# Patient Record
Sex: Female | Born: 1970 | Race: Black or African American | Hispanic: No | Smoking: Never smoker
Health system: Southern US, Community
[De-identification: ages and names within clinical notes are randomized; demographics above are authoritative.]

## PROBLEM LIST (undated history)

## (undated) DIAGNOSIS — G43909 Migraine, unspecified, not intractable, without status migrainosus: Secondary | ICD-10-CM

## (undated) DIAGNOSIS — I10 Essential (primary) hypertension: Secondary | ICD-10-CM

## (undated) DIAGNOSIS — F32A Depression, unspecified: Secondary | ICD-10-CM

## (undated) DIAGNOSIS — T1491XA Suicide attempt, initial encounter: Secondary | ICD-10-CM

## (undated) DIAGNOSIS — F329 Major depressive disorder, single episode, unspecified: Secondary | ICD-10-CM

## (undated) DIAGNOSIS — F70 Mild intellectual disabilities: Secondary | ICD-10-CM

## (undated) DIAGNOSIS — E785 Hyperlipidemia, unspecified: Secondary | ICD-10-CM

## (undated) HISTORY — PX: ABDOMINAL HYSTERECTOMY: SHX81

## (undated) HISTORY — DX: Migraine, unspecified, not intractable, without status migrainosus: G43.909

## (undated) HISTORY — PX: ENDOMETRIAL ABLATION: SHX621

## (undated) HISTORY — PX: TONSILLECTOMY: SUR1361

---

## 2010-10-16 ENCOUNTER — Inpatient Hospital Stay (INDEPENDENT_AMBULATORY_CARE_PROVIDER_SITE_OTHER)
Admission: RE | Admit: 2010-10-16 | Discharge: 2010-10-16 | Disposition: A | Discharge status: Home / Self Care | Payer: Medicaid Other | Source: Ambulatory Visit | Attending: Emergency Medicine | Admitting: Emergency Medicine

## 2010-10-16 DIAGNOSIS — J45909 Unspecified asthma, uncomplicated: Secondary | ICD-10-CM

## 2010-10-16 DIAGNOSIS — J40 Bronchitis, not specified as acute or chronic: Secondary | ICD-10-CM

## 2011-10-13 ENCOUNTER — Encounter (HOSPITAL_COMMUNITY): Payer: Self-pay

## 2011-10-13 ENCOUNTER — Emergency Department (HOSPITAL_COMMUNITY): Payer: Medicaid Other

## 2011-10-13 ENCOUNTER — Emergency Department (HOSPITAL_COMMUNITY)
Admission: EM | Admit: 2011-10-13 | Discharge: 2011-10-13 | Disposition: A | Discharge status: Home / Self Care | Payer: Medicaid Other | Attending: Emergency Medicine | Admitting: Emergency Medicine

## 2011-10-13 ENCOUNTER — Other Ambulatory Visit: Payer: Self-pay

## 2011-10-13 DIAGNOSIS — R51 Headache: Secondary | ICD-10-CM

## 2011-10-13 DIAGNOSIS — R079 Chest pain, unspecified: Secondary | ICD-10-CM | POA: Insufficient documentation

## 2011-10-13 DIAGNOSIS — I10 Essential (primary) hypertension: Secondary | ICD-10-CM | POA: Insufficient documentation

## 2011-10-13 DIAGNOSIS — Z79899 Other long term (current) drug therapy: Secondary | ICD-10-CM | POA: Insufficient documentation

## 2011-10-13 DIAGNOSIS — J45909 Unspecified asthma, uncomplicated: Secondary | ICD-10-CM | POA: Insufficient documentation

## 2011-10-13 HISTORY — DX: Depression, unspecified: F32.A

## 2011-10-13 HISTORY — DX: Essential (primary) hypertension: I10

## 2011-10-13 HISTORY — DX: Major depressive disorder, single episode, unspecified: F32.9

## 2011-10-13 LAB — DIFFERENTIAL
Basophils Absolute: 0 10*3/uL (ref 0.0–0.1)
Basophils Relative: 0 % (ref 0–1)
Eosinophils Absolute: 0.1 10*3/uL (ref 0.0–0.7)
Eosinophils Relative: 1 % (ref 0–5)
Lymphs Abs: 1.9 10*3/uL (ref 0.7–4.0)

## 2011-10-13 LAB — BASIC METABOLIC PANEL
Calcium: 9.1 mg/dL (ref 8.4–10.5)
GFR calc non Af Amer: 61 mL/min — ABNORMAL LOW (ref >90)
Glucose, Bld: 95 mg/dL (ref 70–99)
Sodium: 137 mEq/L (ref 135–145)

## 2011-10-13 LAB — CBC
MCH: 27.4 pg (ref 26.0–34.0)
MCHC: 33.3 g/dL (ref 30.0–36.0)
MCV: 82.1 fL (ref 78.0–100.0)
Platelets: 266 10*3/uL (ref 150–400)
RDW: 15.1 % (ref 11.5–15.5)

## 2011-10-13 LAB — POCT I-STAT TROPONIN I: Troponin i, poc: 0.02 ng/mL (ref 0.00–0.08)

## 2011-10-13 MED ORDER — METOCLOPRAMIDE HCL 5 MG/ML IJ SOLN
10.0000 mg | Freq: Once | INTRAMUSCULAR | Status: AC
  Administered 2011-10-13: 10 mg via INTRAVENOUS
  Filled 2011-10-13: qty 2

## 2011-10-13 MED ORDER — ONDANSETRON 8 MG PO TBDP: ORAL_TABLET | ORAL | Status: AC

## 2011-10-13 MED ORDER — HYDROCODONE-ACETAMINOPHEN 5-325 MG PO TABS: 2.0000 | ORAL_TABLET | ORAL | Status: AC | PRN

## 2011-10-13 MED ORDER — LORAZEPAM 2 MG/ML IJ SOLN
1.0000 mg | Freq: Once | INTRAMUSCULAR | Status: AC
  Administered 2011-10-13: 1 mg via INTRAVENOUS
  Filled 2011-10-13: qty 1

## 2011-10-13 NOTE — ED Notes (Signed)
Pt reports woke up yesterday morning with intermittent sharp chest pain in center of chest that radiates to left arm and headache.  Says bp has been elevated since yesterday.  Pt reports history of htn and has been taking her medication.

## 2011-10-13 NOTE — ED Provider Notes (Signed)
History   This chart was scribed for Hurman Horn, MD by Melba Coon. The patient was seen in room [facility] and the patient's care was started at 10:10AM.    CSN: [id number]  Arrival date & time 10/13/11  0948   None     Chief Complaint  Patient presents with  . Hypertension  . Chest Pain    (Consider location/radiation/quality/duration/timing/severity/associated sxs/prior treatment) HPI Kelli Walls is a 41 y.o. female who presents to the Emergency Department complaining of intermittent, sharp, stabbing, moderate to severe radiating sternal chest pain with an sudden onset yesterday. Timing of CP is a few seconds at a time and has a sudden onset. Pt has been under a lot of stress lately; no suicidal ideation. BP taken this morning and it was high (186/82). OTC pain meds have been taken for the CP. Pt also has a chronic intermittent cough. CP is not aggravated by the cough. HA gradually started today. Pt has been seeing white spots, but otherwise no change in vision, hearing, speech, coordination, swallowing, or understanding; no lateralizing or focal weakness, numbness, or incoordination. Pt has a Hx of HA and this episode is typical for the pt. No SOB, neck pain, back pain, abd pain or extremity pan, weakness, numbness, or edema. Pt has a hx of stress disorder and HTN and takes BP meds. No Hx of heart problems. Family Hx of diabetes (mother). No known allergies. No other pertinent medical problems.  Past Medical History  Diagnosis Date  . Hypertension   . Asthma   . Depression     History reviewed. No pertinent past surgical history.  No family history on file.  History  Substance Use Topics  . Smoking status: Never Smoker   . Smokeless tobacco: Not on file  . Alcohol Use: No    OB History    Grav Para Term Preterm Abortions TAB SAB Ect Mult Living                  Review of Systems 10 Systems reviewed and are negative for acute change except as noted in the  HPI.  Allergies  Review of patient's allergies indicates no known allergies.  Home Medications   Current Outpatient Rx  Name Route Sig Dispense Refill  . ACETAMINOPHEN 500 MG PO TABS Oral Take 1,000 mg by mouth every 6 (six) hours as needed. For pain    . BUSPIRONE HCL 10 MG PO TABS Oral Take 10 mg by mouth 3 (three) times daily.    Marland Kitchen FLUVASTATIN SODIUM 40 MG PO CAPS Oral Take 40 mg by mouth daily.    . ADULT MULTIVITAMIN W/MINERALS CH Oral Take 1 tablet by mouth daily.    Marland Kitchen RAMIPRIL 10 MG PO CAPS Oral Take 10 mg by mouth daily.    Marland Kitchen RISPERIDONE 0.25 MG PO TABS Oral Take 0.25 mg by mouth 2 (two) times daily.    Marland Kitchen HYDROCODONE-ACETAMINOPHEN 5-325 MG PO TABS Oral Take 2 tablets by mouth every 4 (four) hours as needed for pain. 4 tablet 0  . ONDANSETRON 8 MG PO TBDP  8mg  ODT q4 hours prn nausea 2 tablet 0    BP 130/80  Pulse 81  Resp 18  Ht 5\' 3"  (1.6 m)  Wt 221 lb (100.245 kg)  BMI 39.15 kg/m2  SpO2 98%  LMP 09/26/2011  Physical Exam  Nursing note and vitals reviewed. Constitutional: She is oriented to person, place, and time. She appears well-developed and well-nourished.  Awake, alert, nontoxic appearance with baseline speech for patient.  HENT:  Head: Normocephalic and atraumatic.  Mouth/Throat: Oropharynx is clear and moist. No oropharyngeal exudate.  Eyes: Conjunctivae and EOM are normal. Pupils are equal, round, and reactive to light. Right eye exhibits no discharge. Left eye exhibits no discharge.  Neck: Normal range of motion. Neck supple.  Cardiovascular: Normal rate, regular rhythm and normal heart sounds.  Exam reveals no gallop and no friction rub.   No murmur heard. Pulmonary/Chest: Effort normal and breath sounds normal. No stridor. No respiratory distress. She has no wheezes. She has no rales. She exhibits no tenderness.  Abdominal: Soft. Bowel sounds are normal. She exhibits no mass. There is no tenderness. There is no rebound.  Musculoskeletal: She exhibits  no tenderness.       Baseline ROM, moves extremities with no obvious new focal weakness.  Lymphadenopathy:    She has no cervical adenopathy.  Neurological: She is alert and oriented to person, place, and time. No cranial nerve deficit. Coordination normal.       Awake, alert, cooperative and aware of situation; motor strength bilaterally; sensation normal to light touch bilaterally; peripheral visual fields full to confrontation; no facial asymmetry; tongue midline; major cranial nerves appear intact; no pronator drift, normal finger to nose bilaterally, baseline gait without new ataxia.  Skin: Skin is warm and dry. No rash noted.  Psychiatric: She has a normal mood and affect. Her behavior is normal.    ED Course  Procedures (including critical care time)  ECG: Normal sinus rhythm, ventricular rate 91, normal axis, normal intervals, no acute ischemic changes noted, no comparison ECG available  DIAGNOSTIC STUDIES: Oxygen Saturation is 99% on room air, normal by my interpretation.    COORDINATION OF CARE:  1:18PM - recheck; BP nml; pt feeling much better; EDMD plans to d/c  Results for orders placed during the hospital encounter of 10/13/11  CBC      Component Value Range   WBC 8.8  4.0 - 10.5 (K/uL)   RBC 4.24  3.87 - 5.11 (MIL/uL)   Hemoglobin 11.6 (*) 12.0 - 15.0 (g/dL)   HCT 09.8 (*) 11.9 - 46.0 (%)   MCV 82.1  78.0 - 100.0 (fL)   MCH 27.4  26.0 - 34.0 (pg)   MCHC 33.3  30.0 - 36.0 (g/dL)   RDW 14.7  82.9 - 56.2 (%)   Platelets 266  150 - 400 (K/uL)  DIFFERENTIAL      Component Value Range   Neutrophils Relative 70  43 - 77 (%)   Neutro Abs 6.1  1.7 - 7.7 (K/uL)   Lymphocytes Relative 22  12 - 46 (%)   Lymphs Abs 1.9  0.7 - 4.0 (K/uL)   Monocytes Relative 7  3 - 12 (%)   Monocytes Absolute 0.6  0.1 - 1.0 (K/uL)   Eosinophils Relative 1  0 - 5 (%)   Eosinophils Absolute 0.1  0.0 - 0.7 (K/uL)   Basophils Relative 0  0 - 1 (%)   Basophils Absolute 0.0  0.0 - 0.1 (K/uL)   BASIC METABOLIC PANEL      Component Value Range   Sodium 137  135 - 145 (mEq/L)   Potassium 3.5  3.5 - 5.1 (mEq/L)   Chloride 105  96 - 112 (mEq/L)   CO2 24  19 - 32 (mEq/L)   Glucose, Bld 95  70 - 99 (mg/dL)   BUN 14  6 - 23 (mg/dL)   Creatinine,  Ser 1.13 (*) 0.50 - 1.10 (mg/dL)   Calcium 9.1  8.4 - 16.1 (mg/dL)   GFR calc non Af Amer 61 (*) >90 (mL/min)   GFR calc Af Amer 71 (*) >90 (mL/min)  POCT PREGNANCY, URINE      Component Value Range   Preg Test, Ur NEGATIVE  NEGATIVE   POCT I-STAT TROPONIN I      Component Value Range   Troponin i, poc 0.02  0.00 - 0.08 (ng/mL)   Comment 3             Dg Chest 2 View  10/13/2011  *RADIOLOGY REPORT*  Clinical Data: Hypertension.  Chest pain.  CHEST - 2 VIEW  Comparison: None.  Findings: The patient has taken a poor inspiration.  Allowing for that, I think the heart mediastinal shadows are normal and the lungs are clear.  No effusions.  No significant bony finding.  IMPRESSION: Poor inspiration.  No active disease suspected.  Original Report Authenticated By: Thomasenia Sales, M.D.     1. Headache   2. Chest pain   3. Hypertension       MDM  I personally performed the services described in this documentation, which was scribed in my presence. The recorded information has been reviewed and considered. I doubt any other EMC precluding discharge at this time including, but not necessarily limited to the following:ACS.     Hurman Horn, MD 10/13/11 2103

## 2011-10-13 NOTE — Discharge Instructions (Signed)
Your caregiver has diagnosed you as having chest pain that is not specific for one problem, but does not require admission.  You are at low risk for an acute heart condition or other serious illness. Chest pain comes from many different causes.  °SEEK IMMEDIATE MEDICAL ATTENTION IF: °You have severe chest pain, especially if the pain is crushing or pressure-like and spreads to the arms, back, neck, or jaw, or if you have sweating, nausea (feeling sick to your stomach), or shortness of breath. THIS IS AN EMERGENCY. Don't wait to see if the pain will go away. Get medical help at once. Call 911 or 0 (operator). DO NOT drive yourself to the hospital.  °Your chest pain gets worse and does not go away with rest.  °You have an attack of chest pain lasting longer than usual, despite rest and treatment with the medications your caregiver has prescribed.  °You wake from sleep with chest pain or shortness of breath.  °You feel dizzy or faint.  °You have chest pain not typical of your usual pain for which you originally saw your caregiver. ° °You are having a headache. No specific cause was found today for your headache. It may have been a migraine or other cause of headache. Stress, anxiety, fatigue, and depression are common triggers for headaches. Your headache today does not appear to be life-threatening or require hospitalization, but often the exact cause of headaches is not determined in the emergency department. Therefore, follow-up with your doctor is very important to find out what may have caused your headache, and whether or not you need any further diagnostic testing or treatment. Sometimes headaches can appear benign (not harmful), but then more serious symptoms can develop which should prompt an immediate re-evaluation by your doctor or the emergency department. °SEEK MEDICAL ATTENTION IF: °You develop possible problems with medications prescribed.  °The medications don't resolve your headache, if it recurs , or  if you have multiple episodes of vomiting or can't take fluids. °You have a change from the usual headache. °RETURN IMMEDIATELY IF you develop a sudden, severe headache or confusion, become poorly responsive or faint, develop a fever above 100.4F or problem breathing, have a change in speech, vision, swallowing, or understanding, or develop new weakness, numbness, tingling, incoordination, or have a seizure. ° °

## 2012-02-26 DIAGNOSIS — R030 Elevated blood-pressure reading, without diagnosis of hypertension: Secondary | ICD-10-CM

## 2012-12-19 ENCOUNTER — Encounter: Payer: Self-pay | Admitting: Family Medicine

## 2012-12-19 ENCOUNTER — Ambulatory Visit (INDEPENDENT_AMBULATORY_CARE_PROVIDER_SITE_OTHER): Payer: Medicaid Other | Admitting: Family Medicine

## 2012-12-19 VITALS — BP 152/94 | Temp 97.7°F | Ht 62.0 in | Wt 215.0 lb

## 2012-12-19 DIAGNOSIS — R0989 Other specified symptoms and signs involving the circulatory and respiratory systems: Secondary | ICD-10-CM

## 2012-12-19 DIAGNOSIS — R42 Dizziness and giddiness: Secondary | ICD-10-CM

## 2012-12-19 DIAGNOSIS — I10 Essential (primary) hypertension: Secondary | ICD-10-CM

## 2012-12-19 DIAGNOSIS — R0683 Snoring: Secondary | ICD-10-CM

## 2012-12-19 LAB — COMPREHENSIVE METABOLIC PANEL
ALT: 8 U/L (ref 0–35)
Alkaline Phosphatase: 42 U/L (ref 39–117)
Sodium: 138 mEq/L (ref 135–145)
Total Bilirubin: 0.5 mg/dL (ref 0.3–1.2)
Total Protein: 7.5 g/dL (ref 6.0–8.3)

## 2012-12-19 LAB — POCT URINALYSIS DIPSTICK
Glucose, UA: NEGATIVE
Nitrite, UA: NEGATIVE
Urobilinogen, UA: NEGATIVE
pH, UA: 5

## 2012-12-19 LAB — POCT UA - MICROALBUMIN

## 2012-12-19 LAB — POCT UA - MICROSCOPIC ONLY
Casts, Ur, LPF, POC: NEGATIVE
Crystals, Ur, HPF, POC: NEGATIVE
Yeast, UA: NEGATIVE

## 2012-12-19 LAB — LIPID PANEL
LDL Cholesterol: 174 mg/dL — ABNORMAL HIGH (ref 0–99)
Triglycerides: 171 mg/dL — ABNORMAL HIGH (ref <150)
VLDL: 34 mg/dL (ref 0–40)

## 2012-12-19 MED ORDER — AMLODIPINE BESY-BENAZEPRIL HCL 10-40 MG PO CAPS: 1.0000 | ORAL_CAPSULE | Freq: Every day | ORAL | Status: DC

## 2012-12-19 MED ORDER — HYDROCHLOROTHIAZIDE 25 MG PO TABS: 25.0000 mg | ORAL_TABLET | Freq: Every day | ORAL | Status: DC

## 2012-12-19 NOTE — Progress Notes (Signed)
  Subjective:    Patient ID: Kelli Walls, female    DOB: [birth year], 42 y.o.   MRN: [id number]  HPI Pt presents today with chief complaint of dizziness.  Pt represents mild positional dizziness over last 1-2 weeks.  Pt with baseline hx/o HTN.  Pt states that she has not taken medication over last 6 months.  Pt states that lotrel tends to make her sleepy.  Pt also reports severe snoring and daytime somnolence.  No CP, SOB.  No hemiparesis, confusion.  No active dizziness currently.  Pt also reports intermittent NSAID use.     Review of Systems  All other systems reviewed and are negative.       Objective:   Physical Exam  Constitutional: She appears well-developed.  HENT:  Head: Normocephalic and atraumatic.  Right Ear: External ear normal.  Left Ear: External ear normal.  Mouth/Throat: Oropharynx is clear and moist.  Eyes: Conjunctivae are normal. Pupils are equal, round, and reactive to light.  Neck: Normal range of motion. Neck supple.  Cardiovascular: Normal rate, regular rhythm and normal heart sounds.   Pulmonary/Chest: Effort normal and breath sounds normal.  Abdominal: Soft. Bowel sounds are normal.  Musculoskeletal: Normal range of motion.  Lymphadenopathy:    She has no cervical adenopathy.  Neurological: She is alert. She displays normal reflexes. No cranial nerve deficit. Coordination normal.  Skin: Skin is warm.          Assessment & Plan:  Dizziness:  Likely secondary to poorly controlled HTN. Pt has medication with her.  Will have take 1 tablet and reassess BP in hour.  Will check risk stratification labs including A1C, Lipid panel Check UA- if trace protein, continue ACEi  BP 160s/100 on repeat.  Will increase lotrel to 10/40 as well as start on HCTZ.  Currently asymptomatic.  Discussed CV red flags. Plan for recheck of BP in 3-4 hours with new medications in system.

## 2012-12-24 ENCOUNTER — Ambulatory Visit (INDEPENDENT_AMBULATORY_CARE_PROVIDER_SITE_OTHER): Payer: Medicaid Other | Admitting: Family Medicine

## 2012-12-24 ENCOUNTER — Encounter: Payer: Self-pay | Admitting: Family Medicine

## 2012-12-24 ENCOUNTER — Other Ambulatory Visit: Payer: Self-pay | Admitting: Family Medicine

## 2012-12-24 VITALS — BP 130/72 | HR 80 | Temp 99.1°F | Ht 63.0 in | Wt 212.0 lb

## 2012-12-24 DIAGNOSIS — I10 Essential (primary) hypertension: Secondary | ICD-10-CM

## 2012-12-24 DIAGNOSIS — E785 Hyperlipidemia, unspecified: Secondary | ICD-10-CM

## 2012-12-24 DIAGNOSIS — Z1231 Encounter for screening mammogram for malignant neoplasm of breast: Secondary | ICD-10-CM

## 2012-12-24 MED ORDER — ROSUVASTATIN CALCIUM 20 MG PO TABS: 20.0000 mg | ORAL_TABLET | Freq: Every day | ORAL | Status: DC

## 2012-12-24 NOTE — Progress Notes (Signed)
Patient ID: Kelli Walls, female   DOB: July 26, 1971, 42 y.o.   MRN: [id number] Subjective:    Patient here for follow-up of elevated blood pressure.  She is not exercising and is not currently adherent to a low-salt diet.  Blood pressure is not being checked at home. Cardiac symptoms: none. Patient denies: chest pain, chest pressure/discomfort and dyspnea. Cardiovascular risk factors: dyslipidemia, microalbuminuria and obesity (BMI >= 30 kg/m2). Use of agents associated with hypertension: none. History of target organ damage: none. Pt recently started on HCTZ and lotrel was increased to 10/40mg  tabs daily. Initially had some mild dizziness with HCTZ, however this has resolved. No CP,SOB.   The following portions of the patient's history were reviewed and updated as appropriate: allergies, current medications, past family history, past medical history, past social history, past surgical history and problem list.  Review of Systems  Pertinent items are noted in HPI.     Objective:    BP 130/72  Pulse 80  Temp(Src) 99.1 F (37.3 C) (Oral)  Ht 5\' 3"  (1.6 m)  Wt 212 lb (96.163 kg)  BMI 37.56 kg/m2  General Appearance:    Alert, cooperative, no distress, appears stated age  Head:    Normocephalic, without obvious abnormality, atraumatic  Eyes:    PERRL, conjunctiva/corneas clear, EOM's intact, fundi    benign, both eyes  Ears:    Normal TM's and external ear canals, both ears  Nose:   Nares normal, septum midline, mucosa normal, no drainage    or sinus tenderness  Throat:   Lips, mucosa, and tongue normal; teeth and gums normal  Neck:   Supple, symmetrical, trachea midline, no adenopathy;    thyroid:  no enlargement/tenderness/nodules; no carotid   bruit or JVD  Back:     Symmetric, no curvature, ROM normal, no CVA tenderness  Lungs:     Clear to auscultation bilaterally, respirations unlabored  Chest Wall:    No tenderness or deformity   Heart:    Regular rate and rhythm, S1 and S2 normal,  no murmur, rub   or gallop     Abdomen:     Soft, non-tender, bowel sounds active all four quadrants,    no masses, no organomegaly     Rectal:    Normal tone, normal prostate, no masses or tenderness;   guaiac negative stool  Extremities:   Extremities normal, atraumatic, no cyanosis or edema  Pulses:   2+ and symmetric all extremities  Skin:   Skin color, texture, turgor normal, no rashes or lesions  Lymph nodes:   Cervical, supraclavicular, and axillary nodes normal  Neurologic:   CNII-XII intact, normal strength, sensation and reflexes    throughout   EKG: NSR    Assessment:    Hypertension, normal blood pressure improved with meds. Evidence of target organ damage: none.    Plan:   Continue current treatment regimen.  Add on crestor for HLD. Recheck lipids in 6 months.  Continue ACE in setting of microalbuminuria.  Low salt diet and exercise.  Check BPs at home.  Baseline EKG obtained today.  Follow up in 6 weeks.      The patient and/or caregiver has been counseled thoroughly with regard to treatment plan and/or medications prescribed including dosage, schedule, interactions, rationale for use, and possible side effects and they verbalize understanding. Diagnoses and expected course of recovery discussed and will return if not improved as expected or if the condition worsens. Patient and/or caregiver verbalized understanding.

## 2012-12-24 NOTE — Patient Instructions (Signed)
Sodium-Controlled Diet Sodium is a mineral. It is found in many foods. Sodium may be found naturally or added during the making of a food. The most common form of sodium is salt, which is made up of sodium and chloride. Reducing your sodium intake involves changing your eating habits. The following guidelines will help you reduce the sodium in your diet:  Stop using the salt shaker.  Use salt sparingly in cooking and baking.  Substitute with sodium-free seasonings and spices.  Do not use a salt substitute (potassium chloride) without your caregiver's permission.  Include a variety of fresh, unprocessed foods in your diet.  Limit the use of processed and convenience foods that are high in sodium. USE THE FOLLOWING FOODS SPARINGLY: Breads/Starches  Commercial bread stuffing, commercial pancake or waffle mixes, coating mixes. Waffles. Croutons. Prepared (boxed or frozen) potato, rice, or noodle mixes that contain salt or sodium. Salted French fries or hash browns. Salted popcorn, breads, crackers, chips, or snack foods. Vegetables  Vegetables canned with salt or prepared in cream, butter, or cheese sauces. Sauerkraut. Tomato or vegetable juices canned with salt.  Fresh vegetables are allowed if rinsed thoroughly. Fruit  Fruit is okay to eat. Meat and Meat Substitutes  Salted or smoked meats, such as bacon or Canadian bacon, chipped or corned beef, hot dogs, salt pork, luncheon meats, pastrami, ham, or sausage. Canned or smoked fish, poultry, or meat. Processed cheese or cheese spreads, blue or Roquefort cheese. Battered or frozen fish products. Prepared spaghetti sauce. Baked beans. Reuben sandwiches. Salted nuts. Caviar. Milk  Limit buttermilk to 1 cup per week. Soups and Combination Foods  Bouillon cubes, canned or dried soups, broth, consomm. Convenience (frozen or packaged) dinners with more than 600 mg sodium. Pot pies, pizza, Asian food, fast food cheeseburgers, and specialty  sandwiches. Desserts and Sweets  Regular (salted) desserts, pie, commercial fruit snack pies, commercial snack cakes, canned puddings.  Eat desserts and sweets in moderation. Fats and Oils  Gravy mixes or canned gravy. No more than 1 to 2 tbs of salad dressing. Chip dips.  Eat fats and oils in moderation. Beverages  See those listed under the vegetables and milk groups. Condiments  Ketchup, mustard, meat sauces, salsa, regular (salted) and lite soy sauce or mustard. Dill pickles, olives, meat tenderizer. Prepared horseradish or pickle relish. Dutch-processed cocoa. Baking powder or baking soda used medicinally. Worcestershire sauce. "Light" salt. Salt substitute, unless approved by your caregiver. Document Released: 01/12/2002 Document Revised: 10/15/2011 Document Reviewed: 08/15/2009 [employer] Patient Information 2014 [employer].  

## 2013-01-11 ENCOUNTER — Ambulatory Visit: Payer: Medicaid Other | Attending: Family Medicine | Admitting: Sleep Medicine

## 2013-01-11 DIAGNOSIS — R0683 Snoring: Secondary | ICD-10-CM

## 2013-01-11 DIAGNOSIS — I10 Essential (primary) hypertension: Secondary | ICD-10-CM

## 2013-01-11 DIAGNOSIS — G4733 Obstructive sleep apnea (adult) (pediatric): Secondary | ICD-10-CM | POA: Insufficient documentation

## 2013-01-11 DIAGNOSIS — Z6839 Body mass index (BMI) 39.0-39.9, adult: Secondary | ICD-10-CM | POA: Insufficient documentation

## 2013-01-27 ENCOUNTER — Ambulatory Visit
Admission: RE | Admit: 2013-01-27 | Discharge: 2013-01-27 | Disposition: A | Discharge status: Home / Self Care | Payer: Medicaid Other | Source: Ambulatory Visit | Attending: Family Medicine | Admitting: Family Medicine

## 2013-01-27 DIAGNOSIS — Z1231 Encounter for screening mammogram for malignant neoplasm of breast: Secondary | ICD-10-CM

## 2013-01-27 NOTE — Procedures (Signed)
[facility] Avan Gullett A. Gerilyn Pilgrim, MD     [email or web address]        Kelli Walls, Kelli Walls                ACCOUNT NO.:  [id number]  MEDICAL RECORD NO.:  [id number]          PATIENT TYPE:  OUT  LOCATION:  SLEEP LAB                     FACILITY:  [facility]  PHYSICIAN:  Deaun Rocha A. Gerilyn Pilgrim, M.D. DATE OF BIRTH:  January 13, 1971  DATE OF STUDY:  01/11/2013                           NOCTURNAL POLYSOMNOGRAM  REFERRING PHYSICIAN:  Doree Albee, MD  INDICATION:  A 42 year old who presents with hypersomnia, fatigue, and snoring.  The study is being done to evaluate for obstructive sleep apnea syndrome.   EPWORTH SLEEPINESS SCORE:  11.  BMI:  39.  MEDICATIONS:  Lotrel, hydrochlorothiazide.  SLEEP ARCHITECTURE:  The total recording time is 398 minutes.  Sleep efficiency 94%.  Sleep latency 7.5 minutes.  REM latency 243 minutes. Stage N1 1%, N2 75%, N3 80%, and REM sleep 3%.  RESPIRATORY DATA:  Baseline oxygen saturation is 97, lowest saturation 87 during non-REM sleep.  Diagnostic AHI is 9 and RDI 10.  LIMB MOVEMENT SUMMARY:  PLM index 0.  ELECTROCARDIOGRAM SUMMARY:  Average heart rate is 78 with no significant dysrhythmias observed.  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  Thanks for this referral.    Luvenia Cranford A. Gerilyn Pilgrim, M.D.    KAD/MEDQ  D:  01/27/2013 08:50:07  T:  01/27/2013 09:04:45  Job:  [id number]

## 2013-02-02 ENCOUNTER — Ambulatory Visit (INDEPENDENT_AMBULATORY_CARE_PROVIDER_SITE_OTHER): Payer: Medicaid Other | Admitting: Family Medicine

## 2013-02-02 ENCOUNTER — Encounter: Payer: Self-pay | Admitting: Family Medicine

## 2013-02-02 VITALS — BP 111/69 | HR 102 | Temp 99.9°F | Wt 209.2 lb

## 2013-02-02 DIAGNOSIS — I10 Essential (primary) hypertension: Secondary | ICD-10-CM

## 2013-02-02 MED ORDER — AMLODIPINE BESY-BENAZEPRIL HCL 10-40 MG PO CAPS
1.0000 | ORAL_CAPSULE | Freq: Every day | ORAL | Status: DC
Start: 2013-02-02 — End: 2013-03-24

## 2013-02-02 NOTE — Progress Notes (Signed)
  Subjective:    Patient ID: Kelli Walls, female    DOB: 02-17-1971, 42 y.o.   MRN: [id number]  HPI This 42 y.o. female presents for evaluation of hypertension.  Patient was seen over a month for elevated blood pressure and was put on Amlodipine benazepril combo 10/40 one po qd along with HCTZ 25mg  po qd.  She reports having dizziness after prolonged standing that is relieved with sitting or lying down.  She has had a sleep study which shows mild OSAS but did not meet criteria for CPAP.   Review of Systems    No chest pain, SOB, HA, dizziness, vision change, N/V, diarrhea, constipation, dysuria, urinary urgency or frequency, myalgias, arthralgias or rash.  Objective:   Physical Exam Vital signs noted  Well developed well nourished female.  HEENT - Head atraumatic Normocephalic                Eyes - PERRLA, Conjuctiva - clear Sclera- Clear EOM                Throat - oropharanx wnl Respiratory - Lungs CTA bilateral Cardiac - RRR S1 and S2 without murmur BP standing - 90/68 Extremities - mild pre-tibial edema bilateral. Neuro - Grossly intact.       Assessment & Plan:  HTN (hypertension) She is experiencing some orthostasis so DC HCTZ.  Follow up in 3 months.  Discussed DASH diet, exercise and weight loss.  She has had a negative mammogram and up to date pap.  She is advised to follow up if she experiences any continued orthostasis.  Advised to get up slowly for next week. Follow up in 3 months.

## 2013-02-02 NOTE — Patient Instructions (Signed)

## 2013-02-13 ENCOUNTER — Telehealth: Payer: Self-pay | Admitting: *Deleted

## 2013-02-13 NOTE — Telephone Encounter (Signed)
Medicaid denied coverage of crestor  because she must try at least 2 of the preferred statins and fail and she has only tried one that being Lipitor.  The other options are lovastatin, pravastatin or simvastatin. Currently she did not get the crestor filled due to cost, can you take care of prescribing one of these others and either of them should go through. Thanks!

## 2013-02-14 NOTE — Telephone Encounter (Signed)
Please Rx pravachol 40mg  daily.  #30, RF-6 Recheck LDL in 3-6 months.

## 2013-02-18 ENCOUNTER — Other Ambulatory Visit: Payer: Self-pay | Admitting: *Deleted

## 2013-02-18 MED ORDER — PRAVASTATIN SODIUM 40 MG PO TABS: 40.0000 mg | ORAL_TABLET | Freq: Every day | ORAL | Status: DC

## 2013-02-18 NOTE — Telephone Encounter (Signed)
Talked with Kelvin's mother in Eric's absence and she said she was sure Carnesha would want to try the pravachol and see how it works instead of paying for crestor. Also advised her to let us know if she has any side effects from new medication.  Rx called in to pharmacy.

## 2013-03-24 ENCOUNTER — Emergency Department (HOSPITAL_COMMUNITY): Payer: Medicaid Other

## 2013-03-24 ENCOUNTER — Emergency Department (HOSPITAL_COMMUNITY)
Admission: EM | Admit: 2013-03-24 | Discharge: 2013-03-24 | Disposition: A | Discharge status: Home / Self Care | Payer: Medicaid Other | Attending: Emergency Medicine | Admitting: Emergency Medicine

## 2013-03-24 ENCOUNTER — Encounter (HOSPITAL_COMMUNITY): Payer: Self-pay | Admitting: *Deleted

## 2013-03-24 DIAGNOSIS — Z8659 Personal history of other mental and behavioral disorders: Secondary | ICD-10-CM | POA: Insufficient documentation

## 2013-03-24 DIAGNOSIS — R002 Palpitations: Secondary | ICD-10-CM | POA: Insufficient documentation

## 2013-03-24 DIAGNOSIS — I10 Essential (primary) hypertension: Secondary | ICD-10-CM | POA: Insufficient documentation

## 2013-03-24 DIAGNOSIS — J45909 Unspecified asthma, uncomplicated: Secondary | ICD-10-CM | POA: Insufficient documentation

## 2013-03-24 DIAGNOSIS — F29 Unspecified psychosis not due to a substance or known physiological condition: Secondary | ICD-10-CM | POA: Insufficient documentation

## 2013-03-24 DIAGNOSIS — R5381 Other malaise: Secondary | ICD-10-CM | POA: Insufficient documentation

## 2013-03-24 DIAGNOSIS — IMO0002 Reserved for concepts with insufficient information to code with codable children: Secondary | ICD-10-CM | POA: Insufficient documentation

## 2013-03-24 DIAGNOSIS — Z880 Allergy status to penicillin: Secondary | ICD-10-CM | POA: Insufficient documentation

## 2013-03-24 DIAGNOSIS — R0602 Shortness of breath: Secondary | ICD-10-CM | POA: Insufficient documentation

## 2013-03-24 DIAGNOSIS — F411 Generalized anxiety disorder: Secondary | ICD-10-CM | POA: Insufficient documentation

## 2013-03-24 DIAGNOSIS — R51 Headache: Secondary | ICD-10-CM | POA: Insufficient documentation

## 2013-03-24 DIAGNOSIS — Z79899 Other long term (current) drug therapy: Secondary | ICD-10-CM | POA: Insufficient documentation

## 2013-03-24 LAB — CBC WITH DIFFERENTIAL/PLATELET
Basophils Absolute: 0 10*3/uL (ref 0.0–0.1)
Basophils Relative: 0 % (ref 0–1)
Eosinophils Absolute: 0 10*3/uL (ref 0.0–0.7)
Hemoglobin: 12 g/dL (ref 12.0–15.0)
MCHC: 33.3 g/dL (ref 30.0–36.0)
Monocytes Relative: 7 % (ref 3–12)
Neutro Abs: 5.7 10*3/uL (ref 1.7–7.7)
Neutrophils Relative %: 65 % (ref 43–77)
Platelets: 281 10*3/uL (ref 150–400)
RDW: 14.6 % (ref 11.5–15.5)

## 2013-03-24 LAB — COMPREHENSIVE METABOLIC PANEL
ALT: 8 U/L (ref 0–35)
AST: 11 U/L (ref 0–37)
Albumin: 3.6 g/dL (ref 3.5–5.2)
Alkaline Phosphatase: 43 U/L (ref 39–117)
Chloride: 105 mEq/L (ref 96–112)
Potassium: 3.4 mEq/L — ABNORMAL LOW (ref 3.5–5.1)
Sodium: 139 mEq/L (ref 135–145)
Total Bilirubin: 0.2 mg/dL — ABNORMAL LOW (ref 0.3–1.2)
Total Protein: 7.2 g/dL (ref 6.0–8.3)

## 2013-03-24 MED ORDER — AMLODIPINE BESY-BENAZEPRIL HCL 10-40 MG PO CAPS: 1.0000 | ORAL_CAPSULE | Freq: Every day | ORAL | Status: DC

## 2013-03-24 NOTE — ED Notes (Addendum)
"   I was under a lot of stress and my  bp went up.  150/93" feels sob, and "My mouth is numb"  Rt side of face numb, ? Of time. With rt facial droop

## 2013-03-24 NOTE — ED Provider Notes (Signed)
CSN: [id number]     Arrival date & time 03/24/13  1402 History    This chart was scribed for [employer]. Rubin Payor, MD,  by Ashley Jacobs, ED Scribe. The patient was seen in room APA19/APA19 and the patient's care was started at 1:23 PM.    First MD Initiated Contact with Patient 03/24/13 1418     Chief Complaint  Patient presents with  . Numbness   (Consider location/radiation/quality/duration/timing/severity/associated sxs/prior Treatment) The history is provided by the patient and medical records. No language interpreter was used.   HPI Comments: Kelli Walls is a 42 y.o. female who presents to the Emergency Department complaining of hypertension after having a argument with her boyfriend the 6 hours prior to arrival. Pt mention that she experiences numbness around the right side of her mouth and around her forehead. Pt mentions that she experiences headaches with bright lights. She mentions that she feels like her "heart is about to come out of her throat" and experiences SOB. She reports during the event when she was really aggitated she experienced confusion and her blood pressure was elevated.   Pt has a hx of hypertension, asthma, and depression.   Past Medical History  Diagnosis Date  . Hypertension   . Asthma   . Depression    Past Surgical History  Procedure Laterality Date  . Tonsillectomy    . Endometrial ablation     Family History  Problem Relation Age of Onset  . Diabetes Mother   . Hyperlipidemia Mother    History  Substance Use Topics  . Smoking status: Never Smoker   . Smokeless tobacco: Never Used  . Alcohol Use: No   OB History   Grav Para Term Preterm Abortions TAB SAB Ect Mult Living                 Review of Systems  Respiratory: Positive for shortness of breath.   Cardiovascular: Positive for palpitations.  Neurological: Positive for weakness, numbness and headaches.  Psychiatric/Behavioral: Positive for confusion and agitation. The patient is  nervous/anxious.   All other systems reviewed and are negative.    Allergies  Penicillins  Home Medications   Current Outpatient Rx  Name  Route  Sig  Dispense  Refill  . pravastatin (PRAVACHOL) 40 MG tablet   Oral   Take 1 tablet (40 mg total) by mouth daily.   30 tablet   6   . amLODipine-benazepril (LOTREL) 10-40 MG per capsule   Oral   Take 1 capsule by mouth daily.   30 capsule   0    BP 160/113  Pulse 85  Temp(Src) 98.5 F (36.9 C) (Oral)  Resp 14  Ht 5\' 3"  (1.6 m)  Wt 212 lb 6 oz (96.333 kg)  BMI 37.63 kg/m2  SpO2 100%  LMP 03/14/2013 Physical Exam  Nursing note and vitals reviewed. Constitutional: She is oriented to person, place, and time. She appears well-developed and well-nourished. No distress.  HENT:  Head: Normocephalic and atraumatic.  Mouth/Throat: Oropharynx is clear and moist.  Eyes: Conjunctivae are normal. Pupils are equal, round, and reactive to light. No scleral icterus.  Left eye will not go laterally  Neck: Neck supple.  Cardiovascular: Normal rate, regular rhythm, normal heart sounds and intact distal pulses.   No murmur heard. Pulmonary/Chest: Effort normal and breath sounds normal. No stridor. No respiratory distress. She has no rales.  Abdominal: Soft. Bowel sounds are normal. She exhibits no distension. There is no tenderness.  Musculoskeletal:  Normal range of motion.  Neurological: She is alert and oriented to person, place, and time.  Right sided facial numbness  Skin: Skin is warm and dry. No rash noted.  Psychiatric: She has a normal mood and affect. Her behavior is normal.    ED Course  DIAGNOSTIC STUDIES: Oxygen Saturation is 100% on room air, normal by my interpretation.    COORDINATION OF CARE: 2:26 PM Discussed course of care with pt . Pt understands and agrees.  Procedures (including critical care time)  Labs Reviewed  COMPREHENSIVE METABOLIC PANEL - Abnormal; Notable for the following:    Potassium 3.4 (*)     Glucose, Bld 116 (*)    Total Bilirubin 0.2 (*)    GFR calc non Af Amer 66 (*)    GFR calc Af Amer 76 (*)    All other components within normal limits  CBC WITH DIFFERENTIAL   Ct Head Wo Contrast  03/24/2013   *RADIOLOGY REPORT*  Clinical Data: Right facial numbness  CT HEAD WITHOUT CONTRAST  Technique:  Contiguous axial images were obtained from the base of the skull through the vertex without contrast.  Comparison: CT head 01/28/2009  Findings: Ventricle size is normal.  Negative for acute or chronic infarction.  Negative for hemorrhage or mass.  The calvarium is intact.  IMPRESSION: Negative   Original Report Authenticated By: Janeece Riggers, M.D.   Mr Brain Wo Contrast  03/24/2013   *RADIOLOGY REPORT*  Clinical Data: Right facial numbness.  Unable collaterally with the left eye.  MRI HEAD WITHOUT CONTRAST  Technique:  Multiplanar, multiecho pulse sequences of the brain and surrounding structures were obtained according to standard protocol without intravenous contrast.  Comparison: CT head without contrast 03/24/2013.  Findings: The diffusion weighted images demonstrate no evidence for acute or subacute infarction.  Two or three scattered subcortical T2 hyperintensities are present.  No hemorrhage or mass lesion is present.  Flow is present in the major intracranial arteries.  The globes and orbits are intact.  The paranasal sinuses and mastoid air cells are clear.  IMPRESSION:  1.  Scattered subcortical T2 hyperintensities are likely within normal limits for age.  The largest lesion is in the anterior left frontal lobe, measuring 8 mm.  A demyelinating process is not excluded. 2.  No acute intracranial abnormality.   Original Report Authenticated By: Marin Roberts, M.D.   1. Headache   2. Hypertension     MDM  Patient presents with headache and some neural findings after anxiety. Patient stated that the inability of her left eye to go laterally was new. After discussion with the patient's  family later he states she's been born that way. No facial droop. MRI was done and showed likely normal findings but a demyelinating process is not excluded. Will follow with neurology. Patient's been off her blood pressure medicines will be discharged home with a new prescription. I personally performed the services described in this documentation, which was scribed in my presence. The recorded information has been reviewed and is accurate.     Juliet Rude. Rubin Payor, MD 03/24/13 1740

## 2013-03-25 ENCOUNTER — Telehealth: Payer: Self-pay | Admitting: Family Medicine

## 2013-03-25 NOTE — Telephone Encounter (Signed)
Pt notifed and she has her BP meds  No further questions

## 2013-03-27 ENCOUNTER — Encounter: Payer: Self-pay | Admitting: Family Medicine

## 2013-03-27 ENCOUNTER — Ambulatory Visit (INDEPENDENT_AMBULATORY_CARE_PROVIDER_SITE_OTHER): Payer: Medicaid Other | Admitting: Family Medicine

## 2013-03-27 VITALS — BP 106/71 | HR 84 | Temp 98.1°F | Ht 63.0 in | Wt 211.8 lb

## 2013-03-27 DIAGNOSIS — R51 Headache: Secondary | ICD-10-CM

## 2013-03-27 NOTE — Progress Notes (Signed)
  Subjective:    Patient ID: Kelli Walls, female    DOB: 10-Nov-1970, 42 y.o.   MRN: [id number]  HPI This 42 y.o. female presents for evaluation of recent visit to ED.  She quit taking her bp medicine and she developed Headache, numbness in her tounge and face, and she underwent a CT and MRI of the brain.  Results  Show several T2 subcortical hyperdensities left frontal lobe and one measuring 8mm which demylinating disease is not excluded. She is feeling better and is taking her bp medicine. She has hx of migraine headaches.  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 .   Review of Systems  C/o headaches No chest pain, SOB, dizziness, vision change, N/V, diarrhea, constipation, dysuria, urinary urgency or frequency, myalgias, arthralgias or rash.     Objective:   Physical Exam  Vital signs noted  Well developed well nourished female.  HEENT - Head atraumatic Normocephalic                Eyes - PERRLA, Conjuctiva - clear Sclera- Clear Unable to lateralize or move medial OS                Ears - EAC's Wnl TM's Wnl Gross Hearing WNL                Nose - Nares patent                 Throat - oropharanx wnl Respiratory - Lungs CTA bilateral Cardiac - RRR S1 and S2 without murmur GI - Abdomen soft Nontender and bowel sounds active x 4 Extremities - No edema. Neuro - Grossly intact.      Assessment & Plan:  Chronic headaches - Plan: Ambulatory referral to Neurology.  HTN - Controlled and encouraged her to be more compliant.

## 2013-03-27 NOTE — Patient Instructions (Signed)
Headache and Allergies The relationship between allergies and headaches is unclear. Many people with allergic or infectious nasal problems also have headaches (migraines or sinus headaches). However, sometimes allergies can cause pressure that feels like a headache, and sometimes headaches can cause allergy-like symptoms. It is not always clear whether your symptoms are caused by allergies or by a headache. CAUSES   Migraine: The cause of a migraine is not always known.  Sinus Headache: The cause of a sinus headache may be a sinus infection. Other conditions that may be related to sinus headaches include:  Hay fever (allergic rhinitis).  Deviation of the nasal septum.  Swelling or clogging of the nasal passages. SYMPTOMS  Migraine headache symptoms (which often last 4 to 72 hours) include:  Intense, throbbing pain on one or both sides of the head.  Nausea.  Vomiting.  Being extra sensitive to light.  Being extra sensitive to sound.  Nervous system reactions that appear similar to an allergic reaction:  Stuffy nose.  Runny nose.  Tearing. Sinus headaches are felt as facial pain or pressure.  DIAGNOSIS  Because there is some overlap in symptoms, sinus and migraine headaches are often misdiagnosed. For example, a person with migraines may also feel facial pressure. Likewise, many people with hay fever may get migraine headaches rather than sinus headaches. These migraines can be triggered by the histamine release during an allergic reaction. An antihistamine medicine can eliminate this pain. There are standard criteria that help clarify the difference between these headaches and related allergy or allergy-like symptoms. Your caregiver can use these criteria to determine the proper diagnosis and provide you the best care. TREATMENT  Migraine medicine may help people who have persistent migraine headaches even though their hay fever is controlled. For some people, anti-inflammatory  treatments do not work to relieve migraines. Medicines called triptans (such as sumatriptan) can be helpful for those people. Document Released: 10/13/2003 Document Revised: 10/15/2011 Document Reviewed: 11/04/2009 ExitCare Patient Information 2014 [employer].  

## 2013-04-20 ENCOUNTER — Ambulatory Visit (INDEPENDENT_AMBULATORY_CARE_PROVIDER_SITE_OTHER): Payer: Medicaid Other | Admitting: Neurology

## 2013-04-20 ENCOUNTER — Encounter: Payer: Self-pay | Admitting: Neurology

## 2013-04-20 VITALS — BP 132/79 | HR 86 | Ht 61.5 in | Wt 209.0 lb

## 2013-04-20 DIAGNOSIS — I10 Essential (primary) hypertension: Secondary | ICD-10-CM

## 2013-04-20 DIAGNOSIS — G43909 Migraine, unspecified, not intractable, without status migrainosus: Secondary | ICD-10-CM

## 2013-04-20 MED ORDER — SUMATRIPTAN SUCCINATE 50 MG PO TABS: 50.0000 mg | ORAL_TABLET | ORAL | Status: DC | PRN

## 2013-04-20 NOTE — Progress Notes (Signed)
[facility]  PATIENT: Kelli Walls DOB: 17-Jun-1971  HISTORICAL Kelli Walls is a 42 yo RH  African American female, accompanied by her mother, referred by her primary care physician Dr. Rudi Heap for evaluation of mild abnormal MRI scan  She was born only 4 pounds, developmentally delayed, she was able to walk at 47 and half years old, was diagnosed with mild mental retardation, she graduated from special grade school at 12th grade, lives with her mother now, she has frequent raging outburst when she was younger, which is even worse now.  In March 24 2013, she had argument with her boyfriend, began to experience hypertension, numbness at her mouth, at her forehead, with associated severe headache, this leading to MRI of the brain, scattered subcortical T2 hyperintensities are likely within normal limits for age. The largest lesion is in the anterior left frontal lobe, measuring 8 mm.   She has a history of headaches since teenager, her headache are retrorbital area severe pounding headache with associated light noise sensitivity, pressure, she sees flashlight in her visual field, lasting for 15 minutes, headaches can last up to 6 hours, trigger for her headaches are stress, exertion, menstruation,  She took tylenol, ibuprofen, Excedrin Migraine are helpful too.    REVIEW OF SYSTEMS: Full 14 system review of systems performed and notable only for angry, outburst  ALLERGIES: Allergies  Allergen Reactions  . Penicillins Itching    HOME MEDICATIONS: Outpatient Prescriptions Prior to Visit  Medication Sig Dispense Refill  . amLODipine-benazepril (LOTREL) 10-40 MG per capsule Take 1 capsule by mouth daily.  30 capsule  0  . pravastatin (PRAVACHOL) 40 MG tablet Take 1 tablet (40 mg total) by mouth daily.  30 tablet  6   No facility-administered medications prior to visit.    PAST MEDICAL HISTORY: Past Medical History  Diagnosis Date  . Hypertension   . Asthma   .  Depression     PAST SURGICAL HISTORY: Past Surgical History  Procedure Laterality Date  . Tonsillectomy    . Endometrial ablation      FAMILY HISTORY: Family History  Problem Relation Age of Onset  . Diabetes Mother   . Hyperlipidemia Mother     SOCIAL HISTORY:  History   Social History  . Marital Status: Legally Separated    Spouse Name: N/A    Number of Children: N/A  . Years of Education: N/A    Social History Main Topics  . Smoking status: Never Smoker   . Smokeless tobacco: Never Used  . Alcohol Use: No  . Drug Use: No  . Sexual Activity: Not on file    Social History Narrative   Patient lives at home with her mother Deatra Canter.    Patient is single.    Patient has no children.    Patient has 12th grade education.    PHYSICAL EXAM    Filed Vitals:   04/20/13 0953  BP: 132/79  Pulse: 86  Height: 5' 1.5" (1.562 m)  Weight: 209 lb (94.802 kg)   Body mass index is 38.86 kg/(m^2).   Generalized: In no acute distress  Neck: Supple, no carotid bruits   Cardiac: Regular rate rhythm  Pulmonary: Clear to auscultation bilaterally  Musculoskeletal: No deformity  Neurological examination  Mentation: Alert oriented to time, place, history taking, and causual conversation  Cranial nerve II-XII: Pupils were equal round reactive to light. visual field were full on confrontational test. She has difficulty with left eye horizontal movement, could not  do left eye abduction or adduction.  Facial sensation and strength were normal. hearing was intact to finger rubbing bilaterally. Uvula tongue midline.  head turning and shoulder shrug and were normal and symmetric.Tongue protrusion into cheek strength was normal.  Motor: normal tone, bulk and strength.  Sensory: Intact to fine touch, pinprick, preserved vibratory sensation, and proprioception at toes.  Coordination: Normal finger to nose, heel-to-shin bilaterally there was no truncal ataxia  Gait: Rising up from  seated position without assistance, normal stance, without trunk ataxia, moderate stride, good arm swing, smooth turning, able to perform tiptoe, and heel walking without difficulty.   Romberg signs: Negative  Deep tendon reflexes: Brachioradialis 2/2, biceps 2/2, triceps 2/2, patellar 2/2, Achilles 2/2, plantar responses were flexor bilaterally.   DIAGNOSTIC DATA (LABS, IMAGING, TESTING) - I reviewed patient records, labs, notes, testing and imaging myself where available.  Lab Results  Component Value Date   WBC 8.8 03/24/2013   HGB 12.0 03/24/2013   HCT 36.0 03/24/2013   MCV 85.7 03/24/2013   PLT 281 03/24/2013      Component Value Date/Time   NA 139 03/24/2013 1449   K 3.4* 03/24/2013 1449   CL 105 03/24/2013 1449   CO2 28 03/24/2013 1449   GLUCOSE 116* 03/24/2013 1449   BUN 11 03/24/2013 1449   CREATININE 1.05 03/24/2013 1449   CREATININE 1.12* 12/19/2012 1017   CALCIUM 9.3 03/24/2013 1449   PROT 7.2 03/24/2013 1449   ALBUMIN 3.6 03/24/2013 1449   AST 11 03/24/2013 1449   ALT 8 03/24/2013 1449   ALKPHOS 43 03/24/2013 1449   BILITOT 0.2* 03/24/2013 1449   GFRNONAA 66* 03/24/2013 1449   GFRAA 76* 03/24/2013 1449   Lab Results  Component Value Date   CHOL 254* 12/19/2012   HDL 46 12/19/2012   LDLCALC 174* 12/19/2012   TRIG 171* 12/19/2012   CHOLHDL 5.5 12/19/2012   Lab Results  Component Value Date   HGBA1C 5.3% 12/19/2012     ASSESSMENT AND PLAN   42 years old right-handed Caucasian female, with history of angry outbursts, mild mental retardation, migraine headaches, now presenting with mild abnormal MRI scan, one 8 mm left side frontal area T2 lesions, which most likely migraine related  1. continue SSRI, 2. Imitrex 50 mg as needed for migraine headaches 3. Will only repeat MRI if she has new neurological symptoms, she only has one oval-shaped white matter lesions, less suggestive of MS at this point,    Levert Feinstein, M.D. Ph.D.  [facility] [address],  [address] [place], [place] [address] [phone]

## 2013-05-21 ENCOUNTER — Encounter (HOSPITAL_COMMUNITY): Payer: Self-pay | Admitting: Emergency Medicine

## 2013-05-21 ENCOUNTER — Inpatient Hospital Stay (HOSPITAL_COMMUNITY)
Admission: EM | Admit: 2013-05-21 | Discharge: 2013-05-23 | DRG: 918 | Disposition: A | Discharge status: Other Acute Inpatient Hospital | Payer: Medicaid Other | Attending: Internal Medicine | Admitting: Internal Medicine

## 2013-05-21 DIAGNOSIS — T438X2D Poisoning by other psychotropic drugs, intentional self-harm, subsequent encounter: Secondary | ICD-10-CM

## 2013-05-21 DIAGNOSIS — F411 Generalized anxiety disorder: Secondary | ICD-10-CM | POA: Diagnosis present

## 2013-05-21 DIAGNOSIS — E876 Hypokalemia: Secondary | ICD-10-CM | POA: Diagnosis present

## 2013-05-21 DIAGNOSIS — F70 Mild intellectual disabilities: Secondary | ICD-10-CM | POA: Diagnosis present

## 2013-05-21 DIAGNOSIS — F329 Major depressive disorder, single episode, unspecified: Secondary | ICD-10-CM | POA: Diagnosis present

## 2013-05-21 DIAGNOSIS — F32A Depression, unspecified: Secondary | ICD-10-CM | POA: Diagnosis present

## 2013-05-21 DIAGNOSIS — I10 Essential (primary) hypertension: Secondary | ICD-10-CM | POA: Diagnosis present

## 2013-05-21 DIAGNOSIS — T438X2A Poisoning by other psychotropic drugs, intentional self-harm, initial encounter: Secondary | ICD-10-CM

## 2013-05-21 DIAGNOSIS — J45909 Unspecified asthma, uncomplicated: Secondary | ICD-10-CM | POA: Diagnosis present

## 2013-05-21 DIAGNOSIS — T43224A Poisoning by selective serotonin reuptake inhibitors, undetermined, initial encounter: Principal | ICD-10-CM | POA: Diagnosis present

## 2013-05-21 DIAGNOSIS — Z833 Family history of diabetes mellitus: Secondary | ICD-10-CM

## 2013-05-21 DIAGNOSIS — Z6836 Body mass index (BMI) 36.0-36.9, adult: Secondary | ICD-10-CM

## 2013-05-21 DIAGNOSIS — T438X4A Poisoning by other psychotropic drugs, undetermined, initial encounter: Secondary | ICD-10-CM

## 2013-05-21 DIAGNOSIS — R9431 Abnormal electrocardiogram [ECG] [EKG]: Secondary | ICD-10-CM | POA: Diagnosis not present

## 2013-05-21 DIAGNOSIS — F332 Major depressive disorder, recurrent severe without psychotic features: Secondary | ICD-10-CM | POA: Diagnosis present

## 2013-05-21 DIAGNOSIS — Z23 Encounter for immunization: Secondary | ICD-10-CM

## 2013-05-21 DIAGNOSIS — T43502A Poisoning by unspecified antipsychotics and neuroleptics, intentional self-harm, initial encounter: Secondary | ICD-10-CM | POA: Diagnosis present

## 2013-05-21 DIAGNOSIS — N289 Disorder of kidney and ureter, unspecified: Secondary | ICD-10-CM | POA: Diagnosis present

## 2013-05-21 DIAGNOSIS — E785 Hyperlipidemia, unspecified: Secondary | ICD-10-CM | POA: Diagnosis present

## 2013-05-21 HISTORY — DX: Hyperlipidemia, unspecified: E78.5

## 2013-05-21 HISTORY — DX: Suicide attempt, initial encounter: T14.91XA

## 2013-05-21 HISTORY — DX: Mild intellectual disabilities: F70

## 2013-05-21 LAB — ACETAMINOPHEN LEVEL: Acetaminophen (Tylenol), Serum: <15 ug/mL (ref 10–30)

## 2013-05-21 LAB — MAGNESIUM: Magnesium: 1.9 mg/dL (ref 1.5–2.5)

## 2013-05-21 LAB — COMPREHENSIVE METABOLIC PANEL
ALT: 10 U/L (ref 0–35)
Alkaline Phosphatase: 39 U/L (ref 39–117)
BUN: 15 mg/dL (ref 6–23)
CO2: 24 mEq/L (ref 19–32)
Calcium: 9.6 mg/dL (ref 8.4–10.5)
GFR calc Af Amer: 65 mL/min — ABNORMAL LOW (ref >90)
GFR calc non Af Amer: 56 mL/min — ABNORMAL LOW (ref >90)
Glucose, Bld: 95 mg/dL (ref 70–99)
Total Protein: 7.5 g/dL (ref 6.0–8.3)

## 2013-05-21 LAB — CBC WITH DIFFERENTIAL/PLATELET
Basophils Absolute: 0 10*3/uL (ref 0.0–0.1)
Basophils Relative: 0 % (ref 0–1)
HCT: 39.5 % (ref 36.0–46.0)
Hemoglobin: 13.6 g/dL (ref 12.0–15.0)
Lymphocytes Relative: 20 % (ref 12–46)
MCHC: 34.4 g/dL (ref 30.0–36.0)
Monocytes Absolute: 0.6 10*3/uL (ref 0.1–1.0)
Neutro Abs: 7.2 10*3/uL (ref 1.7–7.7)
Neutrophils Relative %: 73 % (ref 43–77)
RDW: 13.3 % (ref 11.5–15.5)
WBC: 9.8 10*3/uL (ref 4.0–10.5)

## 2013-05-21 LAB — SALICYLATE LEVEL: Salicylate Lvl: <2 mg/dL — ABNORMAL LOW (ref 2.8–20.0)

## 2013-05-21 LAB — RAPID URINE DRUG SCREEN, HOSP PERFORMED: Opiates: NOT DETECTED

## 2013-05-21 MED ORDER — ADULT MULTIVITAMIN W/MINERALS CH
1.0000 | ORAL_TABLET | Freq: Every day | ORAL | Status: DC
  Administered 2013-05-22 – 2013-05-23 (×2): 1 via ORAL
  Filled 2013-05-21 (×2): qty 1

## 2013-05-21 MED ORDER — ONDANSETRON HCL 4 MG/2ML IJ SOLN: 4.0000 mg | Freq: Four times a day (QID) | INTRAMUSCULAR | Status: DC | PRN

## 2013-05-21 MED ORDER — ONDANSETRON HCL 4 MG PO TABS: 4.0000 mg | ORAL_TABLET | Freq: Four times a day (QID) | ORAL | Status: DC | PRN

## 2013-05-21 MED ORDER — SODIUM CHLORIDE 0.9 % IV BOLUS (SEPSIS)
1000.0000 mL | Freq: Once | INTRAVENOUS | Status: AC
  Administered 2013-05-21: 1000 mL via INTRAVENOUS

## 2013-05-21 MED ORDER — POTASSIUM CHLORIDE IN NACL 20-0.9 MEQ/L-% IV SOLN
INTRAVENOUS | Status: DC
  Administered 2013-05-21 – 2013-05-23 (×4): via INTRAVENOUS

## 2013-05-21 MED ORDER — PANTOPRAZOLE SODIUM 40 MG IV SOLR
40.0000 mg | Freq: Every day | INTRAVENOUS | Status: DC
  Administered 2013-05-21 – 2013-05-22 (×2): 40 mg via INTRAVENOUS
  Filled 2013-05-21 (×2): qty 40

## 2013-05-21 MED ORDER — ALUM & MAG HYDROXIDE-SIMETH 200-200-20 MG/5ML PO SUSP: 30.0000 mL | Freq: Four times a day (QID) | ORAL | Status: DC | PRN

## 2013-05-21 MED ORDER — BENAZEPRIL HCL 10 MG PO TABS
40.0000 mg | ORAL_TABLET | Freq: Every day | ORAL | Status: DC
  Administered 2013-05-22 – 2013-05-23 (×2): 40 mg via ORAL
  Filled 2013-05-21 (×2): qty 4

## 2013-05-21 MED ORDER — SIMVASTATIN 20 MG PO TABS
20.0000 mg | ORAL_TABLET | Freq: Every day | ORAL | Status: DC
  Administered 2013-05-21 – 2013-05-22 (×2): 20 mg via ORAL
  Filled 2013-05-21 (×2): qty 1

## 2013-05-21 MED ORDER — SODIUM CHLORIDE 0.9 % IJ SOLN
3.0000 mL | Freq: Two times a day (BID) | INTRAMUSCULAR | Status: DC
  Administered 2013-05-22: 3 mL via INTRAVENOUS

## 2013-05-21 MED ORDER — LEVALBUTEROL HCL 0.63 MG/3ML IN NEBU: 0.6300 mg | INHALATION_SOLUTION | Freq: Four times a day (QID) | RESPIRATORY_TRACT | Status: DC | PRN

## 2013-05-21 MED ORDER — PNEUMOCOCCAL VAC POLYVALENT 25 MCG/0.5ML IJ INJ
0.5000 mL | INJECTION | INTRAMUSCULAR | Status: AC
  Administered 2013-05-22: 0.5 mL via INTRAMUSCULAR
  Filled 2013-05-21: qty 0.5

## 2013-05-21 MED ORDER — INFLUENZA VAC SPLIT QUAD 0.5 ML IM SUSP
0.5000 mL | INTRAMUSCULAR | Status: AC
  Administered 2013-05-22: 0.5 mL via INTRAMUSCULAR
  Filled 2013-05-21: qty 0.5

## 2013-05-21 MED ORDER — SODIUM CHLORIDE 0.9 % IV SOLN
INTRAVENOUS | Status: AC
  Administered 2013-05-21: 19:00:00 via INTRAVENOUS

## 2013-05-21 NOTE — BH Assessment (Addendum)
Consulted with extender Shelda Jakes, PA who reports that [facility] Physician can request a new tele-psych consult once patient is medically cleared and stable on medical floor if necessary as patient has been assessed by TTS on 05-21-13, and informed by EDP Dr. Adriana Simas that patient will be a medical floor admission.   Glorious Peach, MS, LCASA Assessment Counselor

## 2013-05-21 NOTE — BH Assessment (Signed)
Consulted with Dr. Adriana Simas EDP at [facility] who is requesting tele-psych consult on pt who is presenting with depression and overdose on pills. Dr. Adriana Simas reports that he is going to admit patient to medical floor at this time due to overdose.   Glorious Peach, MS, LCASA Assessment Counselor

## 2013-05-21 NOTE — ED Provider Notes (Signed)
CSN: [id number]     Arrival date & time 05/21/13  1334 History   This chart was scribed for Donnetta Hutching, MD, by Yevette Edwards, ED Scribe. This patient was seen in room APA02/APA02 and the patient's care was started at 2:16 PM.  First MD Initiated Contact with Patient 05/21/13 1410     Chief Complaint  Patient presents with  . Drug Overdose    The history is provided by the patient. No language interpreter was used.   HPI Comments: Level V caveat for urgent intervention Kelli Walls is a 42 y.o. female, with a h/o depression, who presents to the Emergency Department due to a drug overdose. The pt reports that she was depressed because she and her boyfriend broke up last night. She took approximately 15-19 20 mg Prozac five hours ago. She states she is experiencing epigastric pain and increased somnolence. The pt was prescribed Prozac three weeks ago.  She denies any prior drug overdoses.   The pt lives in [place].    Past Medical History  Diagnosis Date  . Hypertension   . Asthma   . Depression    Past Surgical History  Procedure Laterality Date  . Tonsillectomy    . Endometrial ablation     Family History  Problem Relation Age of Onset  . Diabetes Mother   . Hyperlipidemia Mother    History  Substance Use Topics  . Smoking status: Never Smoker   . Smokeless tobacco: Never Used  . Alcohol Use: No   No OB history provided.  Review of Systems  Constitutional: Negative for fever.  Gastrointestinal: Positive for abdominal pain.  Psychiatric/Behavioral: Positive for suicidal ideas and sleep disturbance.  All other systems reviewed and are negative.    Allergies  Penicillins  Home Medications   Current Outpatient Rx  Name  Route  Sig  Dispense  Refill  . amLODipine-benazepril (LOTREL) 10-40 MG per capsule   Oral   Take 1 capsule by mouth daily.   30 capsule   0   . FLUoxetine (PROZAC) 20 MG capsule   Oral   Take 20 mg by mouth daily.         .  pravastatin (PRAVACHOL) 40 MG tablet   Oral   Take 1 tablet (40 mg total) by mouth daily.   30 tablet   6   . SUMAtriptan (IMITREX) 50 MG tablet   Oral   Take 1 tablet (50 mg total) by mouth every 2 (two) hours as needed for migraine. May repeat in 2 hours if headache persists or recurs.   15 tablet   12    Triage Vitals: BP 109/71  Pulse 95  Temp(Src) 98 F (36.7 C) (Oral)  Resp 17  SpO2 98%  Physical Exam  Nursing note and vitals reviewed. Constitutional: She is oriented to person, place, and time. She appears well-developed and well-nourished.  HENT:  Head: Normocephalic and atraumatic.  Eyes: Conjunctivae and EOM are normal. Pupils are equal, round, and reactive to light.  Neck: Normal range of motion. Neck supple.  Cardiovascular: Normal rate, regular rhythm and normal heart sounds.   Pulmonary/Chest: Effort normal and breath sounds normal.  Abdominal: Soft. Bowel sounds are normal.  Musculoskeletal: Normal range of motion.  Neurological: She is alert and oriented to person, place, and time.  Skin: Skin is warm and dry.  Psychiatric: She has a normal mood and affect.    ED Course  Procedures (including critical care time)  DIAGNOSTIC STUDIES: Oxygen Saturation  is 98% on room air, normal by my interpretation.    COORDINATION OF CARE:  2:21 PM- Discussed treatment plan with patient, and the patient agreed to the plan.   Labs Review Labs Reviewed  COMPREHENSIVE METABOLIC PANEL - Abnormal; Notable for the following:    Potassium 3.4 (*)    Creatinine, Ser 1.20 (*)    GFR calc non Af Amer 56 (*)    GFR calc Af Amer 65 (*)    All other components within normal limits  SALICYLATE LEVEL - Abnormal; Notable for the following:    Salicylate Lvl <2.0 (*)    All other components within normal limits  CBC WITH DIFFERENTIAL  ETHANOL  ACETAMINOPHEN LEVEL  URINE RAPID DRUG SCREEN (HOSP PERFORMED)   Imaging Review No results found.  EKG Interpretation      Ventricular Rate:  88 PR Interval:  150 QRS Duration: 82 QT Interval:  394 QTC Calculation: 476 R Axis:   15 Text Interpretation:  Normal sinus rhythm Normal ECG When compared with ECG of 24-Mar-2013 14:42, No significant change was found            Date: 05/21/2013  Rate: 88  Rhythm: normal sinus rhythm  QRS Axis: normal  Intervals: normal  ST/T Wave abnormalities: normal  Conduction Disutrbances: none  Narrative Interpretation: unremarkable  CRITICAL CARE Performed by: Donnetta Hutching  ?  Total critical care time: 30  Critical care time was exclusive of separately billable procedures and treating other patients.  Critical care was necessary to treat or prevent imminent or life-threatening deterioration.  Critical care was time spent personally by me on the following activities: development of treatment plan with patient and/or surrogate as well as nursing, discussions with consultants, evaluation of patient's response to treatment, examination of patient, obtaining history from patient or surrogate, ordering and performing treatments and interventions, ordering and review of laboratory studies, ordering and review of radiographic studies, pulse oximetry and re-evaluation of patient's condition.  MDM  No diagnosis found. Alleged overdose of Prozac. Vital signs are stable. Poison control consulted.  Possibility of seizures and QT prolongation were discussed. Admit to telemetry.      Donnetta Hutching, MD 05/21/13 [phone]

## 2013-05-21 NOTE — ED Notes (Signed)
Spoke with [employer] from [employer] - recommendations to monitor pt x 6 hrs or until pt is asymptomatic, EKG, fluids, tylenol level, CMP, and supportive care.  Common side effects of prozac OD are drowsiness, dizziness, seizures, prolonged QT and wide QRS complexes.

## 2013-05-21 NOTE — ED Notes (Signed)
Pt took approx 19 Prozac 20 mg in attempt to harm herself around 9 or 10 this morning.  States she was upset because her and her boyfriend got into an argument.  Pt denies SI at this time.

## 2013-05-21 NOTE — BH Assessment (Signed)
Left a message with nursing secretary on adult unit to inform Danny Zimny that she needs to contact TTS for follow-up regarding patient.   Glorious Peach, MS, LCASA Assessment Counselor

## 2013-05-21 NOTE — Progress Notes (Signed)
Pt's mother came and collected pt's purse, cell phone, and medications.  Pt's wallet is locked in security.
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
## 2013-05-21 NOTE — H&P (Signed)
[facility] History and Physical  Kelli Walls WUJ:[id number] DOB: 1971-03-13 DOA: 05/21/2013  Referring physician: Donnetta Hutching, M.D. PCP: Rudi Heap, MD  Specialists: Psychiatrist, Dr. Delice Bison  Chief Complaint: Attempted suicide with Prozac overdose.  HPI: Kelli Walls is a 42 y.o. female with a history significant for mild mental retardation, depression, previous suicide attempt, hypertension, and asthma, who presents to the hospital with an attempted suicide from Prozac overdose. The history is being provided by both the patient and the patient's mother. Accordingly, the patient became upset last night when she broke up with her boyfriend. She felt so depressed that she did not want to live anymore. Sometime this morning,, she took approximately 15-20, 20 mg Prozac tablets. Her mother states that she walked into the kitchen and stated "mama please forgive me". The patient proceeded to tell her mother what she had done. Eventually the patient called 911. She has a history of depression dating back to when she was an adolescent. She was involuntarily committed at [facility] approximately 20-25 years ago for an attempted suicide. Neither she nor her mother could tell me exactly how she attempted suicide at that time. Apparently, she was seen by psychiatrist, Dr. Geanie Walls approximately 3 weeks ago and at that time, Prozac was prescribed. Currently, she has mild nausea and a mild frontal headache, but no complaints, jitteriness, vomiting, abdominal pain, or chest pain. She denies shortness of breath.  During the evaluation in the emergency department, she was noted to be afebrile and hemodynamically stable. Her lab data were significant for a serum potassium of 3.4 and creatinine of 1.20. Her salicylate level was less than 2.0. Her acetaminophen level was less than 15.0. Her alcohol level was less than 11. Her urine drug screen was negative. Her EKG revealed normal sinus rhythm with a heart rate of  88 beats per minute and no abnormalities. She is being admitted for further evaluation and management.    Review of Systems: As above in history present illness, otherwise negative.  Past Medical History  Diagnosis Date  . Hypertension   . Asthma   . Depression   . Suicide attempt   . Mental retardation, mild (I.Q. 50-70)   . Hyperlipidemia    Past Surgical History  Procedure Laterality Date  . Tonsillectomy    . Endometrial ablation     Social History: She is divorced. She lives with her mother. She completed 12th grade. She has no children. She stopped drinking alcohol in February 2014. She denies tobacco or any other illicit drug use. She receives disability benefits for mild mental retardation.   Allergies  Allergen Reactions  . Penicillins Itching    Family History  Problem Relation Age of Onset  . Diabetes, depression with suicide attempt  Mother   . Hyperlipidemia Mother      Prior to Admission medications   Medication Sig Start Date End Date Taking? Authorizing Provider  amLODipine-benazepril (LOTREL) 10-40 MG per capsule Take 1 capsule by mouth daily. 03/24/13  Yes Juliet Rude. Pickering, MD  FLUoxetine (PROZAC) 20 MG capsule Take 20 mg by mouth daily.   Yes Historical Provider, MD  Multiple Vitamin (MULTIVITAMIN WITH MINERALS) TABS tablet Take 1 tablet by mouth daily.   Yes Historical Provider, MD  pravastatin (PRAVACHOL) 40 MG tablet Take 40 mg by mouth at bedtime. 02/18/13  Yes Doree Albee, MD  SUMAtriptan (IMITREX) 50 MG tablet Take 1 tablet (50 mg total) by mouth every 2 (two) hours as needed for migraine. May repeat  in 2 hours if headache persists or recurs. 04/20/13  Yes Levert Feinstein, MD   Physical Exam: Filed Vitals:   05/21/13 1800  BP: 126/78  Pulse: 72  Temp:   Resp: 20   temperature 98.0 degrees   General: Obese 42 year old African/American woman laying in bed, in no acute distress.  Eyes: Pupils are equal, round, and reactive to light. Extraocular  movements are intact. Conjunctivae are clear. Sclerae are white.  ENT: Oropharynx reveals mildly dry mucous membranes. No posterior exudates or edema.  Neck: Supple, no adenopathy, and no JVD, no thyromegaly.  Cardiovascular: S1, S2, with no murmurs rubs or gallops.  Respiratory: Clear to auscultation bilaterally.  Abdomen: Obese, positive bowel sounds, soft, nontender, nondistended.  Skin: Good turgor. No rashes.  Musculoskeletal: No acute hot red joints. Muscle tone normal.  Psychiatric: Affect appears to be in appropriate for attempted suicide and/or depression as the patient giggles at times when discussing her past medical history.  Neurologic: Cranial nerves II through XII are intact. She is alert and oriented x3. No focal neurological abnormalities noted.  Labs on Admission:  Basic Metabolic Panel:  Recent Labs Lab 05/21/13 1405  NA 137  K 3.4*  CL 101  CO2 24  GLUCOSE 95  BUN 15  CREATININE 1.20*  CALCIUM 9.6   Liver Function Tests:  Recent Labs Lab 05/21/13 1405  AST 13  ALT 10  ALKPHOS 39  BILITOT 0.4  PROT 7.5  ALBUMIN 4.0   No results found for this basename: LIPASE, AMYLASE,  in the last 168 hours No results found for this basename: AMMONIA,  in the last 168 hours CBC:  Recent Labs Lab 05/21/13 1405  WBC 9.8  NEUTROABS 7.2  HGB 13.6  HCT 39.5  MCV 85.3  PLT 283   Cardiac Enzymes: No results found for this basename: CKTOTAL, CKMB, CKMBINDEX, TROPONINI,  in the last 168 hours  BNP (last 3 results) No results found for this basename: PROBNP,  in the last 8760 hours CBG: No results found for this basename: GLUCAP,  in the last 168 hours  Radiological Exams on Admission: No results found.  EKG: Independently reviewed. As above.  Assessment/Plan Principal Problem:   Suicide attempt by other psychotropic drug overdose Active Problems:   Depression   Obesity, morbid   Essential hypertension, benign   Acute renal insufficiency    Hypokalemia   1. This is a 42 year old woman with mild mental retardation, who presents with a suicide attempt with Prozac (SSRI) secondary to situational depression in the setting of chronic depression. She is currently hemodynamically stable. She has mild renal insufficiency and mild hypokalemia, but otherwise, her laboratory studies and EKG are unremarkable. There is certainly a concern for serotonin toxicity. We will supplement and replete her potassium in the IV fluids. We will give her vigorous IV fluids. We will check her magnesium level to rule out deficiency. We will order a followup EKG in the morning. We will start IV Protonix empirically. Zofran will be ordered as needed for nausea. She will be placed on suicide precautions and a 24-hour sitter will be ordered. The ACT team or Tele-psychiatry will be consulted when she is medically cleared. I believe she needs inpatient psychiatric treatment.    Code Status: Full code Family Communication: Discussed with her mother. Disposition Plan: Undetermined, but she will likely be discharged to an inpatient psychiatric facility in the next 24-48 hours.  Time spent: One hour.  [facility] Pager [phone]  If 7PM-7AM,  please contact night-coverage [email or web address] Password [email or web address] 05/21/2013, 7:30 PM     Will

## 2013-05-22 DIAGNOSIS — R9431 Abnormal electrocardiogram [ECG] [EKG]: Secondary | ICD-10-CM | POA: Diagnosis not present

## 2013-05-22 DIAGNOSIS — I10 Essential (primary) hypertension: Secondary | ICD-10-CM

## 2013-05-22 LAB — COMPREHENSIVE METABOLIC PANEL
AST: 13 U/L (ref 0–37)
Albumin: 3.4 g/dL — ABNORMAL LOW (ref 3.5–5.2)
Alkaline Phosphatase: 34 U/L — ABNORMAL LOW (ref 39–117)
BUN: 9 mg/dL (ref 6–23)
Calcium: 8.7 mg/dL (ref 8.4–10.5)
GFR calc Af Amer: 78 mL/min — ABNORMAL LOW (ref >90)
Glucose, Bld: 90 mg/dL (ref 70–99)
Potassium: 3.6 mEq/L (ref 3.5–5.1)
Sodium: 137 mEq/L (ref 135–145)
Total Protein: 6.4 g/dL (ref 6.0–8.3)

## 2013-05-22 LAB — CBC
HCT: 35.8 % — ABNORMAL LOW (ref 36.0–46.0)
Hemoglobin: 12.1 g/dL (ref 12.0–15.0)
MCHC: 33.8 g/dL (ref 30.0–36.0)
MCV: 85.6 fL (ref 78.0–100.0)
RBC: 4.18 MIL/uL (ref 3.87–5.11)
RDW: 13.3 % (ref 11.5–15.5)
WBC: 8.5 10*3/uL (ref 4.0–10.5)

## 2013-05-22 MED ORDER — MAGNESIUM SULFATE 50 % IJ SOLN: 1.0000 g | Freq: Once | INTRAVENOUS | Status: DC

## 2013-05-22 MED ORDER — MAGNESIUM SULFATE IN D5W 10-5 MG/ML-% IV SOLN
1.0000 g | Freq: Once | INTRAVENOUS | Status: AC
  Administered 2013-05-22: 1 g via INTRAVENOUS
  Filled 2013-05-22: qty 100

## 2013-05-22 MED ORDER — POTASSIUM CHLORIDE CRYS ER 20 MEQ PO TBCR
20.0000 meq | EXTENDED_RELEASE_TABLET | Freq: Two times a day (BID) | ORAL | Status: AC
  Administered 2013-05-22 (×2): 20 meq via ORAL
  Filled 2013-05-22 (×2): qty 1

## 2013-05-22 NOTE — Clinical Social Work Note (Signed)
Pt admitted for suicide attempt. [facility] contacted yesterday by staff and request that consult be ordered when pt is medically clear. Per MD, pt does not have clearance yet. RN aware to contact [facility] over weekend if stable.  Derenda Fennel, [place] [phone]

## 2013-05-22 NOTE — Progress Notes (Signed)
Utilization Review Complete  

## 2013-05-22 NOTE — Progress Notes (Signed)
[facility] PROGRESS NOTE  Kelli Walls VOZ:[id number] DOB: Jan 01, 1971 DOA: 05/21/2013 PCP: Rudi Heap, MD    Code Status: Full code Family Communication: Family not available Disposition Plan: Anticipate discharge to inpatient psychiatric unit when medically cleared.   Consultants:  None  Procedures:  None  Antibiotics:  None  HPI/Subjective: The patient has no complaints of chest pain, dizziness, nausea, vomiting, or abdominal pain.  Objective: Filed Vitals:   05/22/13 1057  BP: 107/67  Pulse: 75  Temp: 98.5 F (36.9 C)  Resp: 20    Intake/Output Summary (Last 24 hours) at 05/22/13 1308 Last data filed at 05/22/13 1251  Gross per 24 hour  Intake 1557.08 ml  Output      0 ml  Net 1557.08 ml   Filed Weights   05/21/13 1855  Weight: 92.2 kg (203 lb 4.2 oz)    Exam:   General:  Obese 42 year old African-American woman laying in bed, in no acute distress.  Cardiovascular: S1, S2, with no murmurs rubs gallops.  Respiratory: Clear to auscultation bilaterally.  Abdomen: Obese, positive bowel sounds, soft, nontender, nondistended.  Musculoskeletal: No acute hot red joints. No pedal edema.  Neurologic/psychiatric: She is alert and oriented x3. Her speech is clear. Her affect is pleasant. She agrees to inpatient psychiatric treatment. She is regretful of taking the Prozac to harm herself.   Data Reviewed: Basic Metabolic Panel:  Recent Labs Lab 05/21/13 1400 05/21/13 1405 05/22/13 0453  NA  --  137 137  K  --  3.4* 3.6  CL  --  101 103  CO2  --  24 25  GLUCOSE  --  95 90  BUN  --  15 9  CREATININE  --  1.20* 1.03  CALCIUM  --  9.6 8.7  MG 1.9  --   --    Liver Function Tests:  Recent Labs Lab 05/21/13 1405 05/22/13 0453  AST 13 13  ALT 10 9  ALKPHOS 39 34*  BILITOT 0.4 0.3  PROT 7.5 6.4  ALBUMIN 4.0 3.4*   No results found for this basename: LIPASE, AMYLASE,  in the last 168 hours No results found for this basename:  AMMONIA,  in the last 168 hours CBC:  Recent Labs Lab 05/21/13 1405 05/22/13 0453  WBC 9.8 8.5  NEUTROABS 7.2  --   HGB 13.6 12.1  HCT 39.5 35.8*  MCV 85.3 85.6  PLT 283 280   Cardiac Enzymes: No results found for this basename: CKTOTAL, CKMB, CKMBINDEX, TROPONINI,  in the last 168 hours BNP (last 3 results) No results found for this basename: PROBNP,  in the last 8760 hours CBG: No results found for this basename: GLUCAP,  in the last 168 hours  No results found for this or any previous visit (from the past 240 hour(s)).   Studies: No results found.  Scheduled Meds: . benazepril  40 mg Oral Daily  . multivitamin with minerals  1 tablet Oral Daily  . pantoprazole (PROTONIX) IV  40 mg Intravenous QHS  . simvastatin  20 mg Oral q1800  . sodium chloride  3 mL Intravenous Q12H   Continuous Infusions: . 0.9 % NaCl with KCl 20 mEq / L 75 mL/hr at 05/22/13 1056   Assessment/plan:  Principal Problem:   Suicide attempt by other psychotropic drug overdose Active Problems:   Depression   Obesity, morbid   Essential hypertension, benign   Acute renal insufficiency   Hypokalemia   Prolonged Q-T interval on ECG   The  patient is hemodynamically stable and afebrile. She has developed a slightly prolonged QT interval compared to the EKG yesterday. Her potassium is better with IV supplementation via the IV fluids. Her renal function is also improved. We'll start potassium chloride oral supplementation and continue gentle IV fluids. She has no GI discomfort or abdominal upset. We'll advance her diet.  We'll give her 1 g of magnesium sulfate empirically. We'll order another EKG in the morning. If her QT interval is stable or improved, Tele-Psych will need to be consulted tomorrow. Otherwise, she is medically stable. Continue Recruitment consultant.   Time spent: 30 minutes.    [facility]  [facility] Pager [phone]. If 7PM-7AM, please contact night-coverage at [email or web address],  password [facility] 05/22/2013, 1:08 PM  LOS: 1 day

## 2013-05-23 ENCOUNTER — Inpatient Hospital Stay (HOSPITAL_COMMUNITY)
Admission: AD | Admit: 2013-05-23 | Discharge: 2013-05-27 | DRG: 885 | Disposition: A | Discharge status: Home / Self Care | Payer: MEDICAID | Source: Intra-hospital | Attending: Psychiatry | Admitting: Psychiatry

## 2013-05-23 ENCOUNTER — Encounter (HOSPITAL_COMMUNITY): Payer: Self-pay | Admitting: *Deleted

## 2013-05-23 DIAGNOSIS — T50901A Poisoning by unspecified drugs, medicaments and biological substances, accidental (unintentional), initial encounter: Secondary | ICD-10-CM

## 2013-05-23 DIAGNOSIS — J45909 Unspecified asthma, uncomplicated: Secondary | ICD-10-CM | POA: Diagnosis present

## 2013-05-23 DIAGNOSIS — F332 Major depressive disorder, recurrent severe without psychotic features: Principal | ICD-10-CM | POA: Diagnosis present

## 2013-05-23 DIAGNOSIS — F70 Mild intellectual disabilities: Secondary | ICD-10-CM | POA: Diagnosis present

## 2013-05-23 DIAGNOSIS — I10 Essential (primary) hypertension: Secondary | ICD-10-CM | POA: Diagnosis present

## 2013-05-23 DIAGNOSIS — F339 Major depressive disorder, recurrent, unspecified: Secondary | ICD-10-CM

## 2013-05-23 DIAGNOSIS — F411 Generalized anxiety disorder: Secondary | ICD-10-CM

## 2013-05-23 DIAGNOSIS — Z5189 Encounter for other specified aftercare: Secondary | ICD-10-CM

## 2013-05-23 DIAGNOSIS — Z79899 Other long term (current) drug therapy: Secondary | ICD-10-CM

## 2013-05-23 LAB — COMPREHENSIVE METABOLIC PANEL
ALT: 10 U/L (ref 0–35)
Alkaline Phosphatase: 35 U/L — ABNORMAL LOW (ref 39–117)
CO2: 24 mEq/L (ref 19–32)
Chloride: 104 mEq/L (ref 96–112)
GFR calc Af Amer: 79 mL/min — ABNORMAL LOW (ref >90)
Glucose, Bld: 91 mg/dL (ref 70–99)
Potassium: 4.2 mEq/L (ref 3.5–5.1)
Sodium: 136 mEq/L (ref 135–145)
Total Bilirubin: 0.2 mg/dL — ABNORMAL LOW (ref 0.3–1.2)
Total Protein: 6.6 g/dL (ref 6.0–8.3)

## 2013-05-23 LAB — MAGNESIUM: Magnesium: 2.1 mg/dL (ref 1.5–2.5)

## 2013-05-23 MED ORDER — PANTOPRAZOLE SODIUM 40 MG PO TBEC: 40.0000 mg | DELAYED_RELEASE_TABLET | Freq: Every day | ORAL | Status: DC

## 2013-05-23 MED ORDER — ACETAMINOPHEN 325 MG PO TABS: 650.0000 mg | ORAL_TABLET | Freq: Four times a day (QID) | ORAL | Status: DC | PRN

## 2013-05-23 MED ORDER — SIMVASTATIN 40 MG PO TABS
40.0000 mg | ORAL_TABLET | Freq: Every day | ORAL | Status: DC
  Administered 2013-05-24 – 2013-05-26 (×3): 40 mg via ORAL
  Filled 2013-05-23 (×3): qty 1
  Filled 2013-05-23: qty 2
  Filled 2013-05-23: qty 1

## 2013-05-23 MED ORDER — HYDROXYZINE HCL 25 MG PO TABS
25.0000 mg | ORAL_TABLET | Freq: Every evening | ORAL | Status: DC | PRN
  Administered 2013-05-23 – 2013-05-26 (×4): 25 mg via ORAL
  Filled 2013-05-23: qty 1
  Filled 2013-05-23: qty 3
  Filled 2013-05-23 (×3): qty 1

## 2013-05-23 MED ORDER — BENAZEPRIL HCL 40 MG PO TABS
40.0000 mg | ORAL_TABLET | Freq: Every day | ORAL | Status: DC
  Administered 2013-05-24 – 2013-05-27 (×4): 40 mg via ORAL
  Filled 2013-05-23 (×5): qty 1

## 2013-05-23 MED ORDER — MAGNESIUM HYDROXIDE 400 MG/5ML PO SUSP: 30.0000 mL | Freq: Every day | ORAL | Status: DC | PRN

## 2013-05-23 MED ORDER — ALUM & MAG HYDROXIDE-SIMETH 200-200-20 MG/5ML PO SUSP: 30.0000 mL | ORAL | Status: DC | PRN

## 2013-05-23 NOTE — [facility] Counselor (Signed)
Pt is accepted by Nanine Means, NP-C, to Dr. Elsie Saas, bed [phone]. ROI and Voluntary Admission has been faxed to Melina Copa [phone], Maralyn Sago has been provided the number to Juel Burrow [phone] to arrange transportation. Denice Bors, [facility] 05/23/2013 3:53 PM
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
## 2013-05-23 NOTE — Discharge Summary (Signed)
Physician Discharge Summary  Kelli Walls AVW:[id number] DOB: May 04, 1971 DOA: 05/21/2013  PCP: Rudi Heap, MD  Admit Walls: 05/21/2013 Discharge Walls: 05/23/2013  Time spent: 35 minutes  Recommendations for Outpatient Follow-up:  1. Transfer to inpatient psychiatry for further treatments 2. Prozac has been discontinued, defer resumption to psychiatry  Discharge Diagnoses:  Principal Problem:   Suicide attempt by other psychotropic drug overdose Active Problems:   Obesity, morbid   Essential hypertension, benign   Depression   Acute renal insufficiency   Hypokalemia   Prolonged Q-T interval on ECG   Discharge Condition: improved  Diet recommendation: low salt  Filed Weights   05/21/13 1855  Weight: 92.2 kg (203 lb 4.2 oz)    History of present illness:  Kelli Walls is a 42 y.o. female with a history significant for mild mental retardation, depression, previous suicide attempt, hypertension, and asthma, who presents to the hospital with an attempted suicide from Prozac overdose. The history is being provided by both the patient and the patient's mother. Accordingly, the patient became upset last night when she broke up with her boyfriend. She felt so depressed that she did not want to live anymore. Sometime this morning,, she took approximately 15-20, 20 mg Prozac tablets. Her mother states that she walked into the kitchen and stated "mama please forgive me". The patient proceeded to tell her mother what she had done. Eventually the patient called 911. She has a history of depression dating back to when she was an adolescent. She was involuntarily committed at [facility] approximately 20-25 years ago for an attempted suicide. Neither she nor her mother could tell me exactly how she attempted suicide at that time. Apparently, she was seen by psychiatrist, Dr. Geanie Cooley approximately 3 weeks ago and at that time, Prozac was prescribed. Currently, she has mild nausea and a mild frontal  headache, but no complaints, jitteriness, vomiting, abdominal pain, or chest pain. She denies shortness of breath.  During the evaluation in the emergency department, she was noted to be afebrile and hemodynamically stable. Her lab data were significant for a serum potassium of 3.4 and creatinine of 1.20. Her salicylate level was less than 2.0. Her acetaminophen level was less than 15.0. Her alcohol level was less than 11. Her urine drug screen was negative. Her EKG revealed normal sinus rhythm with a heart rate of 88 beats per minute and no abnormalities. She is being admitted for further evaluation and management.   Hospital Course:  This patient was admitted with intentional overdose with fluoxetine. Patient has history of depression as well as prior suicide attempts. She was admitted to the hospital for medical clearance. Her lab work has been unremarkable and her vitals have been stable. It was felt that she may have some mild prolongation of her QT segment on EKG. This was monitored with serial EKGs and has been stable. No further workup is necessary at this time. She is feeling improved and back to her baseline. She is medically cleared for discharge. She was seen by psychiatry and felt that inpatient psychiatry would be appropriate. Patient is agreeable. She will be discharged once bed is available.  Procedures:  none  Consultations:  Tele psychiatry  Discharge Exam: Filed Vitals:   05/23/13 1259  BP: 113/93  Pulse: 66  Temp: 98.9 F (37.2 C)  Resp: 18    General: NAD Cardiovascular: S1, S2 RRR Respiratory: CTA B  Discharge Instructions   Future Appointments Provider Department Dept Phone   05/27/2013 10:00 AM Chrissie Noa  Sabino Gasser, FNP [facility] [phone]   08/04/2013 3:00 PM Deatra Canter, FNP [facility] [phone]   10/19/2013 10:30 AM Nilda Riggs, NP [facility] [phone]        Medication List    STOP taking these medications       FLUoxetine 20 MG capsule  Commonly known as:  PROZAC      TAKE these medications       amLODipine-benazepril 10-40 MG per capsule  Commonly known as:  LOTREL  Take 1 capsule by mouth daily.     multivitamin with minerals Tabs tablet  Take 1 tablet by mouth daily.     pravastatin 40 MG tablet  Commonly known as:  PRAVACHOL  Take 40 mg by mouth at bedtime.     SUMAtriptan 50 MG tablet  Commonly known as:  IMITREX  Take 1 tablet (50 mg total) by mouth every 2 (two) hours as needed for migraine. May repeat in 2 hours if headache persists or recurs.       Allergies  Allergen Reactions  . Penicillins Itching      The results of significant diagnostics from this hospitalization (including imaging, microbiology, ancillary and laboratory) are listed below for reference.    Significant Diagnostic Studies: No results found.  Microbiology: No results found for this or any previous visit (from the past 240 hour(s)).   Labs: Basic Metabolic Panel:  Recent Labs Lab 05/21/13 1400 05/21/13 1405 05/22/13 0453 05/23/13 0434  NA  --  137 137 136  K  --  3.4* 3.6 4.2  CL  --  101 103 104  CO2  --  24 25 24   GLUCOSE  --  95 90 91  BUN  --  15 9 8   CREATININE  --  1.20* 1.03 1.02  CALCIUM  --  9.6 8.7 8.8  MG 1.9  --   --  2.1   Liver Function Tests:  Recent Labs Lab 05/21/13 1405 05/22/13 0453 05/23/13 0434  AST 13 13 13   ALT 10 9 10   ALKPHOS 39 34* 35*  BILITOT 0.4 0.3 0.2*  PROT 7.5 6.4 6.6  ALBUMIN 4.0 3.4* 3.3*   No results found for this basename: LIPASE, AMYLASE,  in the last 168 hours No results found for this basename: AMMONIA,  in the last 168 hours CBC:  Recent Labs Lab 05/21/13 1405 05/22/13 0453  WBC 9.8 8.5  NEUTROABS 7.2  --   HGB 13.6 12.1  HCT 39.5 35.8*  MCV 85.3 85.6  PLT 283 280   Cardiac Enzymes: No results found for this basename: CKTOTAL, CKMB, CKMBINDEX, TROPONINI,  in  the last 168 hours BNP: BNP (last 3 results) No results found for this basename: PROBNP,  in the last 8760 hours CBG: No results found for this basename: GLUCAP,  in the last 168 hours     Signed:  MEMON,JEHANZEB  [facility] 05/23/2013, 2:57 PM

## 2013-05-23 NOTE — Progress Notes (Signed)
Patient ID: Kelli Walls, female   DOB: [birth year], 42 y.o.   MRN: [id number]  42 year old black female admitted after she reported a overdosed on 16 Xanax due to being upset over the recent information received from doctor reporting that patient had lesions on her brain. Pt reported that the doctor told her that she could have MS, and that she would eventually go blind and not be able to move. Pt reported that she was very upset and then she overdosed. Pt reported on admission that she felt much better and that she was negative SI/HI, and no AH/VH noted. Pt did report at time of admission that she was scared because she had never been away from home, this writer assured patient that she would be safe. No other issues or concerns noted at time of admission.

## 2013-05-23 NOTE — Progress Notes (Signed)
Adult Psychoeducational Group Note  Date:  05/23/2013 Time:  8:00 pm  Group Topic/Focus:  Wrap-Up Group:   The focus of this group is to help patients review their daily goal of treatment and discuss progress on daily workbooks.  Participation Level:  Active  Participation Quality:  Appropriate  Affect:  Appropriate  Cognitive:  Appropriate  Insight: Appropriate  Engagement in Group:  Engaged  Modes of Intervention:  Discussion  Additional Comments:  Pt stated that she in the hospital for depression. Pt stated that she likes to help others and she is a good hearted person.   Keldrick Pomplun 05/23/2013, 9:30 PM

## 2013-05-23 NOTE — Tx Team (Signed)
Initial Interdisciplinary Treatment Plan  PATIENT STRENGTHS: (choose at least two) General fund of knowledge Motivation for treatment/growth  PATIENT STRESSORS: Health problems   PROBLEM LIST: Problem List/Patient Goals Date to be addressed Date deferred Reason deferred Estimated date of resolution  Depression 05/23/13                                                      DISCHARGE CRITERIA:  Ability to meet basic life and health needs Improved stabilization in mood, thinking, and/or behavior  PRELIMINARY DISCHARGE PLAN: Return to previous living arrangement  PATIENT/FAMIILY INVOLVEMENT: This treatment plan has been presented to and reviewed with the patient, Kelli Walls, and/or family member.  The patient and family have been given the opportunity to ask questions and make suggestions.  Jacquelyne Balint Shanta 05/23/2013, 6:40 PM

## 2013-05-23 NOTE — Progress Notes (Signed)
Pt is to be discharged to [facility] today. Pt is in NAD, IV is out, all paperwork has been reviewed/discussed with patient, and there are no questions/concerns at this time. Report has been called to the nurse receiving the pt. Assessment is unchanged from this morning. Pt is to be accompanied downstairs by staff and family via wheelchair. [facility] will be transporting pt from [facility] to [facility]. Will continue to monitor.
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
## 2013-05-23 NOTE — Progress Notes (Signed)
The patient is receiving Protonix by the intravenous route.  Based on criteria approved by the Pharmacy and Therapeutics Committee and the Medical Executive Committee, the medication is being converted to the equivalent oral dose form.  These criteria include: -No Active GI bleeding -Able to tolerate diet of full liquids (or better) or tube feeding OR able to tolerate other medications by the oral or enteral route  If you have any questions about this conversion, please contact the Pharmacy Department (ext [phone]).  Thank you.  Wayland Denis, [facility] 05/23/2013 8:46 AM

## 2013-05-24 ENCOUNTER — Encounter (HOSPITAL_COMMUNITY): Payer: Self-pay | Admitting: Psychiatry

## 2013-05-24 DIAGNOSIS — F329 Major depressive disorder, single episode, unspecified: Secondary | ICD-10-CM

## 2013-05-24 DIAGNOSIS — F339 Major depressive disorder, recurrent, unspecified: Secondary | ICD-10-CM | POA: Diagnosis present

## 2013-05-24 MED ORDER — RISPERIDONE 0.5 MG PO TBDP
0.5000 mg | ORAL_TABLET | Freq: Two times a day (BID) | ORAL | Status: DC
  Administered 2013-05-25 – 2013-05-27 (×5): 0.5 mg via ORAL
  Filled 2013-05-24 (×9): qty 1

## 2013-05-24 MED ORDER — CITALOPRAM HYDROBROMIDE 10 MG PO TABS
10.0000 mg | ORAL_TABLET | Freq: Every day | ORAL | Status: DC
  Administered 2013-05-25 – 2013-05-27 (×3): 10 mg via ORAL
  Filled 2013-05-24 (×5): qty 1

## 2013-05-24 NOTE — Progress Notes (Signed)
[facility] Group Notes:  (Nursing/MHT/Case Management/Adjunct)  Date:  05/24/2013  Time:  2:37 PM  Type of Therapy:  Psychoeducational Skills  Participation Level:  Active  Participation Quality:  Appropriate  Affect:  Appropriate  Cognitive:  Appropriate  Insight:  Appropriate  Engagement in Group:  Engaged  Modes of Intervention:  Activity, Socialization and Support  Summary of Progress/Problems: Pt attended group and was appropriate.  Angus Amini C 05/24/2013, 2:37 PM 

## 2013-05-24 NOTE — [facility] Suicide Risk Assessment (Signed)
Suicide Risk Assessment  Admission Assessment     Nursing information obtained from:  Patient Demographic factors:  Low socioeconomic status Current Mental Status:  Self-harm thoughts Loss Factors:  Decline in physical health Historical Factors:  Prior suicide attempts Risk Reduction Factors:  Sense of responsibility to family  CLINICAL FACTORS:   Depression:   Impulsivity Insomnia  COGNITIVE FEATURES THAT CONTRIBUTE TO RISK:  Closed-mindedness Polarized thinking Thought constriction (tunnel vision)    SUICIDE RISK:   Moderate:  Frequent suicidal ideation with limited intensity, and duration, some specificity in terms of plans, no associated intent, good self-control, limited dysphoria/symptomatology, some risk factors present, and identifiable protective factors, including available and accessible social support.  PLAN OF CARE: Supportive approach/coping skills                               CBT/mindfulness/stress management                               Reassess and optimize treatment with psychotropics  I certify that inpatient services furnished can reasonably be expected to improve the patient's condition.  Geanette Buonocore A 05/24/2013, 3:20 PM

## 2013-05-24 NOTE — Progress Notes (Signed)
D) Pt has attended the groups, partisipates and interacts minimally with her peers. Has been appropriate in the milieu. Rates her depression and her hopelessness both at a 1 and denies SI and HI. States she does not want to be here.Quietly sits and listens attentively to all that is being discussed. Affect is bright with interaction. A) Given support and appropriate praise. Encouraged to attend all the groups and to speak up and be a part of the milieu.  R) Denies SI and HI. Adjusting to the unit without difficulty.

## 2013-05-24 NOTE — Progress Notes (Signed)
Writer entered patients room and observed her lying in room resting and was easily aroused when her name was called. Patient did not attend group this evening and did not voice any complaints. Patient reports taking to her mother earlier and has attended groups throughout the day. Writer encouraged patient to get up and maybe eat a snack or hang out in the dayroom if feeling up to it so she would be able to rest tonight. Patient reports felling a little better about being here and she is going to work on not worrying about her friends so much. Patient denies si/hi/a/v hallucinations, safety maintained on unit with 15 min checks, will continue to monitor.

## 2013-05-24 NOTE — Progress Notes (Signed)
Psychoeducational Group Note  Date: 05/24/2013 Time:  0930  Group Topic/Focus:  Gratefulness:  The focus of this group is to help patients identify what two things they are most grateful for in their lives. What helps ground them and to center them on their work to their recovery.  Participation Level:  Active  Participation Quality:  Appropriate  Affect:  Appropriate  Cognitive:  Oriented  Insight:  Improving  Engagement in Group:  Improving  Additional Comments:    Vyron Fronczak A   

## 2013-05-24 NOTE — [facility] Group Notes (Signed)
[facility] Group Notes:  (Clinical Social Work)  05/24/2013   3:00-4:00PM  Summary of Progress/Problems:   The main focus of today's process group was to   identify the patient's current support system and decide on other supports that can be put in place.  The picture on workbook was used to discuss why additional supports are needed, and a hand-out was distributed with four definitions/levels of support, then used to talk about how patients have given and received all different kinds of support.  An emphasis was placed on using counselor, doctor, therapy groups, 12-step groups, and problem-specific support groups to expand supports.  The patient nodded that there is a need to add more supports.  She was basically silent and smiling throughout group, did not contribute to discussion, other than to state that her current supports are her mother, God, and her friend.     Type of Therapy:  Process Group  Participation Level:  Active  Participation Quality:  Attentive  Affect:  Blunted and Not Congruent  Cognitive:  Appropriate  Insight:  Improving  Engagement in Therapy:   Improving  Modes of Intervention:  Education,  Support and Processing  Ambrose Mantle, LCSW 05/24/2013, 4:28 PM

## 2013-05-24 NOTE — Progress Notes (Signed)
Psychoeducational Group Note  Date:  05/24/2013 Time:  1015  Group Topic/Focus:  Making Healthy Choices:   The focus of this group is to help patients identify negative/unhealthy choices they were using prior to admission and identify positive/healthier coping strategies to replace them upon discharge.  Participation Level:  Active  Participation Quality:  Appropriate  Affect:  Appropriate  Cognitive:  Oriented  Insight:  Improving  Engagement in Group:  Engaged  Additional Comments:    Jamelle Noy A 05/24/2013  
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
## 2013-05-25 DIAGNOSIS — F332 Major depressive disorder, recurrent severe without psychotic features: Principal | ICD-10-CM

## 2013-05-25 NOTE — Progress Notes (Signed)
Patient ID: Kelli Walls, female   DOB: October 14, 1970, 42 y.o.   MRN: [id number] D- Patient reports she slept well and that her appetite is good.  Her energy level is high and her ability to pay attention is good.  She rates her depression at 2 and hopelessness at 5.  She denies thoughts of self harm.  She says that she has a hard time trusting anyone.  She finds herself being fearful of people.  A- Talked with patient about staff being caring people and encouraged her to talk with staff R Later in day patient said she was feeling more comfortable.

## 2013-05-25 NOTE — [facility] Group Notes (Signed)
[facility] LCSW Group Therapy          Overcoming Obstacles       1:15 -2:30        05/25/2013   3:13 PM     Type of Therapy:  Group Therapy  Participation Level:  Appropriate  Participation Quality:  Appropriate  Affect:  Appropriate, Alert  Cognitive:  Attentive Appropriate  Insight: Developing/Improving Engaged  Engagement in Therapy: Developing/Imprvoing Engaged  Modes of Intervention:  Discussion Exploration  Education Rapport BuildingProblem-Solving Support  Summary of Progress/Problems:  The main focus of today's group was overcoming  Obstacles.b  She advised the obstacle she needs to overcome is anger toward father for leaving the family when she was a child.  She also stated she needs to overcome choosing friends who use her and walk away.     Wynn Banker 05/25/2013    3:13 PM

## 2013-05-25 NOTE — [facility] Group Notes (Signed)
[facility] LCSW Aftercare Discharge Planning Group Note   05/25/2013 10:59 AM    Participation Quality:  Appropraite  Mood/Affect:  Appropriate  Depression Rating:  10  Anxiety Rating:  10  Thoughts of Suicide:  No  Will you contract for safety?   NA  Current AVH:  No  Plan for Discharge/Comments:  Patient attending discharge planning group and actively participated in group.  Patient reports she is seen outpatient by [facility] Michell Heinrich.  CSW provided all participants with daily workbook and information on services offered by [facility].   Transportation Means: Patient has transportation.   Supports:  Patient has a support system.   Judyann Casasola, Joesph July

## 2013-05-25 NOTE — Progress Notes (Signed)
[facility] MD Progress Note  05/25/2013 2:44 PM Kelli Walls  MRN:  [id number] Subjective:  [name] has no complaint today. She has been adjusting to the milieu and therapies without difficulties. She has been learning coping skills in groups and socializing. She states that she likes pills that disintegrates in her mouth, it seems like she is talking about risperidone - M tabs. . She is a 42 year old mildly retarded AAF admitted for depression and suicidal attempt from the medical floor following an overdose in a suicide attempt. [name] stated that she had an argument with her BF and this was the key factor in her suicide attempt. She reported that she was more angry than depressed.   Diagnosis:   DSM5: Schizophrenia Disorders:   Obsessive-Compulsive Disorders:   Trauma-Stressor Disorders:   Substance/Addictive Disorders:   Depressive Disorders:  Major Depressive Disorder - with Psychotic Features (296.24)  Axis I: Major Depression, Recurrent severe  ADL's:  Intact  Sleep: Fair  Appetite:  Fair  Suicidal Ideation:  Status post suicidal attempt and contract for safety in hospital Homicidal Ideation:  denial AEB (as evidenced by):  Psychiatric Specialty Exam: ROS  Blood pressure 137/91, pulse 91, temperature 97.9 F (36.6 C), temperature source Oral, resp. rate 16, height 5\' 3"  (1.6 m), weight 93.441 kg (206 lb), last menstrual period 05/07/2013.Body mass index is 36.5 kg/(m^2).  General Appearance: Casual  Eye Contact::  Fair  Speech:  Clear and Coherent  Volume:  Normal  Mood:  Anxious and Depressed  Affect:  Constricted and Depressed  Thought Process:  Goal Directed and Intact  Orientation:  Full (Time, Place, and Person)  Thought Content:  Rumination  Suicidal Thoughts:  Yes.  with intent/plan  Homicidal Thoughts:  No  Memory:  Immediate;   Fair  Judgement:  Impaired  Insight:  Lacking  Psychomotor Activity:  Psychomotor Retardation  Concentration:  Fair  Recall:  Fair   Akathisia:  NA  Handed:  Right  AIMS (if indicated):     Assets:  Communication Skills Desire for Improvement Financial Resources/Insurance Physical Health Resilience Social Support  Sleep:  Number of Hours: 6   Current Medications: Current Facility-Administered Medications  Medication Dose Route Frequency Provider Last Rate Last Dose  . acetaminophen (TYLENOL) tablet 650 mg  650 mg Oral Q6H PRN Kelli Means, NP      . alum & mag hydroxide-simeth (MAALOX/MYLANTA) 200-200-20 MG/5ML suspension 30 mL  30 mL Oral Q4H PRN Kelli Means, NP      . benazepril (LOTENSIN) tablet 40 mg  40 mg Oral Daily Kelli Means, NP   40 mg at 05/25/13 0752  . citalopram (CELEXA) tablet 10 mg  10 mg Oral Daily Kelli Spurr, PA-C   10 mg at 05/25/13 0755  . hydrOXYzine (ATARAX/VISTARIL) tablet 25 mg  25 mg Oral QHS PRN Kelli Means, NP   25 mg at 05/24/13 2146  . magnesium hydroxide (MILK OF MAGNESIA) suspension 30 mL  30 mL Oral Daily PRN Kelli Means, NP      . risperiDONE (RISPERDAL M-TABS) disintegrating tablet 0.5 mg  0.5 mg Oral BID Kelli Spurr, PA-C   0.5 mg at 05/25/13 0754  . simvastatin (ZOCOR) tablet 40 mg  40 mg Oral q1800 Kelli Means, NP   40 mg at 05/24/13 1716    Lab Results: No results found for this or any previous visit (from the past 48 hour(s)).  Physical Findings: AIMS: Facial and Oral Movements Muscles of Facial Expression: None, normal Lips and Perioral  Area: None, normal Jaw: None, normal Tongue: None, normal,Extremity Movements Upper (arms, wrists, hands, fingers): None, normal Lower (legs, knees, ankles, toes): None, normal, Trunk Movements Neck, shoulders, hips: None, normal, Overall Severity Severity of abnormal movements (highest score from questions above): None, normal Incapacitation due to abnormal movements: None, normal Patient's awareness of abnormal movements (rate only patient's report): No Awareness, Dental Status Current problems with teeth and/or dentures?:  No Does patient usually wear dentures?: No  CIWA:    COWS:     Treatment Plan Summary: Daily contact with patient to assess and evaluate symptoms and progress in treatment Medication management  Plan: Treatment Plan/Recommendations:   1. Admit for crisis management and stabilization. 2. Medication management to reduce current symptoms to base line and improve the patient's overall level of functioning. Continue Celexa, and risperidone 3. Treat health problems as indicated. 4. Develop treatment plan to decrease risk of relapse upon discharge and to reduce the need for readmission. 5. Psycho-social education regarding relapse prevention and self care. 6. Health care follow up as needed for medical problems. 7. Restart home medications where appropriate. 8. Disposition plans in progress.   Medical Decision Making Problem Points:  Established problem, worsening (2), New problem, with no additional work-up planned (3), Review of last therapy session (1) and Review of psycho-social stressors (1) Data Points:  Review or order clinical lab tests (1) Review or order medicine tests (1) Review of medication regiment & side effects (2) Review of new medications or change in dosage (2)  I certify that inpatient services furnished can reasonably be expected to improve the patient's condition.   Kelli Walls,Kelli Walls. 05/25/2013, 2:44 PM

## 2013-05-25 NOTE — Progress Notes (Signed)
Adult Psychoeducational Group Note  Date:  05/25/2013 Time:  9:55 PM  Group Topic/Focus:  Wrap-Up Group:   The focus of this group is to help patients review their daily goal of treatment and discuss progress on daily workbooks.  Participation Level:  Active  Participation Quality:  Appropriate  Affect:  Appropriate  Cognitive:  Appropriate  Insight: Appropriate  Engagement in Group:  Engaged  Modes of Intervention:  Support  Additional Comments:  Pt stated that one good thing she learned was that she should rather feelings out rather then bottling it all in. She also stated that she made some new friends today. Pt was given support  Jaeli Grubb 05/25/2013, 9:55 PM

## 2013-05-25 NOTE — Tx Team (Signed)
Interdisciplinary Treatment Plan Update   Date Reviewed:  05/25/2013  Time Reviewed:  9:45 AM  Progress in Treatment:   Attending groups: Yes Participating in groups: Yes Taking medication as prescribed: Yes  Tolerating medication: Yes Family/Significant other contact made: No, but will ask patient for consent for collateral contact Patient understands diagnosis: Yes  Discussing patient identified problems/goals with staff: Yes Medical problems stabilized or resolved: Yes Denies suicidal/homicidal ideation: Yes Patient has not harmed self or others: Yes  For review of initial/current patient goals, please see plan of care.  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 Kelli Walls stated that she had an argument with her BF and this was the key factor in her suicide attempt. She reported that she was more mad than depressed at the time but did note that many things had been bothering her for several weeks. She found that she has a lesion in her brain that could mean that she has MS   Attendees:  Patient:  05/25/2013 9:45 AM   Signature: Mervyn Gay, MD 05/25/2013 9:45 AM  Signature:  05/25/2013 9:45 AM  Signature:  05/25/2013 9:45 AM  Signature:Beverly Terrilee Croak, RN 05/25/2013 9:45 AM  Signature:  Neill Loft RN 05/25/2013 9:45 AM  Signature:  Juline Patch, LCSW 05/25/2013 9:45 AM  Signature:  Reyes Ivan, LCSW 05/25/2013 9:45 AM  Signature:  Sharin Grave Coordinator 05/25/2013 9:45 AM  Signature:   05/25/2013 9:45 AM  Signature:  05/25/2013  9:45 AM  Signature:   Nestor Ramp, RN 05/25/2013  9:45 AM   Signature:   05/25/2013  9:45 AM    Scribe for Treatment Team:   Juline Patch,  05/25/2013 9:45 AM

## 2013-05-25 NOTE — Progress Notes (Signed)
Adult Psychoeducational Group Note  Date:  05/25/2013 Time:  11:00am Group Topic/Focus:  Self Care:   The focus of this group is to help patients understand the importance of self-care in order to improve or restore emotional, physical, spiritual, interpersonal, and financial health.  Participation Level:  Active  Participation Quality:  Appropriate and Attentive  Affect:  Appropriate  Cognitive:  Alert and Appropriate  Insight: Appropriate  Engagement in Group:  Engaged  Modes of Intervention:  Discussion and Education  Additional Comments:   Pt attended and participated in group. Discussion today was on self-care and the question was asked what does self care mean to you and what is your biggest struggle? Pt stated that self-care means not to listen to what other people say and learn how to control her anger.  Pt stated her struggle was pleasing people.  Shelly Bombard D 05/25/2013, 1:24 PM

## 2013-05-26 NOTE — Progress Notes (Signed)
Adult Psychoeducational Group Note  Date:  05/26/2013 Time:  11:00am Group Topic/Focus:  Recovery Goals:   The focus of this group is to identify appropriate goals for recovery and establish a plan to achieve them.  Participation Level:  Active  Participation Quality:  Appropriate and Attentive  Affect:  Appropriate  Cognitive:  Alert and Appropriate  Insight: Appropriate  Engagement in Group:  Engaged  Modes of Intervention:  Discussion and Education  Additional Comments:  Pt attended and participated in group. Discussion today was on recovery. Question was ask what does recovery mean to you and what are two signals that you notice when you find yourself getting angry,depressed,relaspsing,or getting anxious? Pt stated recovery means facing reality and letting go of the past. Her two signals are being stressed and her heart begins to race.   Shelly Bombard D 05/26/2013, 4:58 PM

## 2013-05-26 NOTE — Progress Notes (Signed)
Patient ID: Kelli Walls, female   DOB: [birth year], 42 y.o.   MRN: [id number]  D: Pt denies SI/HI/AVH. Pt is pleasant and cooperative. Pt stated she had a good day she laughed. Pt stated she is not going back to her ex BF. Pt stated she told him she was going to take som pills and he told her to do it and she did.    A: Pt was offered support and encouragement. Pt was given scheduled medications. Pt was encourage to attend groups. Q 15 minute checks were done for safety.   R:Pt attends groups and interacts well with peers and staff. Pt is taking medication. Pt has no complaints at this time.Pt receptive to treatment and safety maintained on unit.

## 2013-05-26 NOTE — Progress Notes (Signed)
Recreation Therapy Notes  Date: 10.21.2014 Time: 2:30pm Location: [address] Dayroom  Group Topic: Animal Assisted Activities (AAA)  Behavioral Response: Appropriate, Engaged  Affect: Euthymic  Clinical Observations/Feedback: Dog Team: [place] & handler. Patient interacted appropriately with peer, dog team, LRT and MHT.   Naliya Gish L Christian Treadway, LRT/CTRS  Crysten Kaman L 05/26/2013 4:32 PM 

## 2013-05-26 NOTE — Progress Notes (Signed)
Patient ID: Kelli Walls, female   DOB: January 31, 1971, 42 y.o.   MRN: [id number] [facility] MD Progress Note  05/26/2013 2:28 PM Kelli Walls  MRN:  [id number] Subjective:  Kelli Walls has no complaint today. She states that she is sleeping well, eating too well and has no depression and hears no voices. She denies being worried about anything and feels that tomorrow is a good day to go home. She will be following up in [facility].   Diagnosis:   DSM5: Schizophrenia Disorders:   Obsessive-Compulsive Disorders:   Trauma-Stressor Disorders:   Substance/Addictive Disorders:   Depressive Disorders:  Major Depressive Disorder - with Psychotic Features (296.24)  Axis I: Major Depression, Recurrent severe  ADL's:  Intact  Sleep: Fair  Appetite:  Fair  Suicidal Ideation:  Status post suicidal attempt and contract for safety in hospital Homicidal Ideation:  denial AEB (as evidenced by):  Psychiatric Specialty Exam: Review of Systems  Constitutional: Negative.  Negative for fever, chills, weight loss, malaise/fatigue and diaphoresis.  HENT: Negative for congestion and sore throat.   Eyes: Negative for blurred vision, double vision and photophobia.  Respiratory: Negative for cough, shortness of breath and wheezing.   Cardiovascular: Negative for chest pain, palpitations and PND.  Gastrointestinal: Negative for heartburn, nausea, vomiting, abdominal pain, diarrhea and constipation.  Musculoskeletal: Negative for falls, joint pain and myalgias.  Neurological: Negative for dizziness, tingling, tremors, sensory change, speech change, focal weakness, seizures, loss of consciousness, weakness and headaches.  Endo/Heme/Allergies: Negative for polydipsia. Does not bruise/bleed easily.  Psychiatric/Behavioral: Negative for depression, suicidal ideas, hallucinations, memory loss and substance abuse. The patient is not nervous/anxious and does not have insomnia.     Blood pressure 125/85, pulse 88, temperature 98.3  F (36.8 C), temperature source Oral, resp. rate 16, height 5\' 3"  (1.6 m), weight 93.441 kg (206 lb), last menstrual period 05/07/2013.Body mass index is 36.5 kg/(m^2).  General Appearance: Casual  Eye Contact::  Fair  Speech:  Clear and Coherent  Volume:  Normal  Mood:  Anxious and Depressed  Affect:  Constricted and Depressed  Thought Process:  Goal Directed and Intact  Orientation:  Full (Time, Place, and Person)  Thought Content:  Rumination  Suicidal Thoughts:  Yes.  with intent/plan  Homicidal Thoughts:  No  Memory:  Immediate;   Fair  Judgement:  Impaired  Insight:  Lacking  Psychomotor Activity:  Psychomotor Retardation  Concentration:  Fair  Recall:  Fair  Akathisia:  NA  Handed:  Right  AIMS (if indicated):     Assets:  Communication Skills Desire for Improvement Financial Resources/Insurance Physical Health Resilience Social Support  Sleep:  Number of Hours: 6.75   Current Medications: Current Facility-Administered Medications  Medication Dose Route Frequency Provider Last Rate Last Dose  . acetaminophen (TYLENOL) tablet 650 mg  650 mg Oral Q6H PRN Nanine Means, NP      . alum & mag hydroxide-simeth (MAALOX/MYLANTA) 200-200-20 MG/5ML suspension 30 mL  30 mL Oral Q4H PRN Nanine Means, NP      . benazepril (LOTENSIN) tablet 40 mg  40 mg Oral Daily Nanine Means, NP   40 mg at 05/26/13 0747  . citalopram (CELEXA) tablet 10 mg  10 mg Oral Daily Verne Spurr, PA-C   10 mg at 05/26/13 0747  . hydrOXYzine (ATARAX/VISTARIL) tablet 25 mg  25 mg Oral QHS PRN Nanine Means, NP   25 mg at 05/25/13 2141  . magnesium hydroxide (MILK OF MAGNESIA) suspension 30 mL  30 mL Oral Daily PRN Catha Nottingham  Lord, NP      . risperiDONE (RISPERDAL M-TABS) disintegrating tablet 0.5 mg  0.5 mg Oral BID Verne Spurr, PA-C   0.5 mg at 05/26/13 0747  . simvastatin (ZOCOR) tablet 40 mg  40 mg Oral q1800 Nanine Means, NP   40 mg at 05/25/13 1610    Lab Results: No results found for this or any  previous visit (from the past 48 hour(s)).  Physical Findings: AIMS: Facial and Oral Movements Muscles of Facial Expression: None, normal Lips and Perioral Area: None, normal Jaw: None, normal Tongue: None, normal,Extremity Movements Upper (arms, wrists, hands, fingers): None, normal Lower (legs, knees, ankles, toes): None, normal, Trunk Movements Neck, shoulders, hips: None, normal, Overall Severity Severity of abnormal movements (highest score from questions above): None, normal Incapacitation due to abnormal movements: None, normal Patient's awareness of abnormal movements (rate only patient's report): No Awareness, Dental Status Current problems with teeth and/or dentures?: No Does patient usually wear dentures?: No  CIWA:    COWS:     Treatment Plan Summary: Daily contact with patient to assess and evaluate symptoms and progress in treatment Medication management  Plan: Treatment Plan/Recommendations:   1. Continue crisis management and stabilization. 2. Medication management to reduce current symptoms to base line and improve the patient's overall level of functioning. Continue Celexa, and risperidone 3. Treat health problems as indicated. 4. Develop treatment plan to decrease risk of relapse upon discharge and to reduce the need for readmission. 5. Psycho-social education regarding relapse prevention and self care. 6. Health care follow up as needed for medical problems. 7. Restart home medications where appropriate. 8. Disposition plans in progress. 9. Will discharge out tomorrow.  Medical Decision Making Problem Points:  Established problem, worsening (2), New problem, with no additional work-up planned (3), Review of last therapy session (1) and Review of psycho-social stressors (1) Data Points:  Review or order clinical lab tests (1) Review or order medicine tests (1) Review of medication regiment & side effects (2) Review of new medications or change in dosage  (2)  I certify that inpatient services furnished can reasonably be expected to improve the patient's condition.  Rona Ravens. [place] RPAC 2:29 PM 05/26/2013  Reviewed the information documented and agree with the treatment plan.  Hanna Aultman,JANARDHAHA R. 05/27/2013 2:49 PM

## 2013-05-26 NOTE — [facility] Group Notes (Signed)
[facility] LCSW Group Therapy      Feelings About Diagnosis 1:15 - 2:30 PM         05/26/2013  3:06 PM   Type of Therapy:  Group Therapy  Participation Level:  Active  Participation Quality:  Appropriate  Affect:  Appropriate  Cognitive:  Alert and Appropriate  Insight:  Developing/Improving and Engaged  Engagement in Therapy:  Developing/Improving and Engaged  Modes of Intervention:  Discussion, Education, Exploration, Problem-Solving, Rapport Building, Support  Summary of Progress/Problems:  Patient actively participated in group. Patient discussed past and present diagnosis and the effects it has had on  life.  Patient talked about family and society being judgmental and the stigma associated with having a mental health diagnosis.  Patient shared she is relieved to know what is going on with her.  Patient shared she wants to live and does not want anything like this (suicide attempt) to happen again. Wynn Banker 05/26/2013 3:06 PM

## 2013-05-26 NOTE — [facility] Suicide Risk Assessment (Signed)
[facility] INPATIENT:  Family/Significant Other Suicide Prevention Education  Suicide Prevention Education:  Education Completed; Georgia Lopes, Mother, [phone]; has been identified by the patient as the family member/significant other with whom the patient will be residing, and identified as the person(s) who will aid the patient in the event of a mental health crisis (suicidal ideations/suicide attempt).  With written consent from the patient, the family member/significant other has been provided the following suicide prevention education, prior to the and/or following the discharge of the patient.  The suicide prevention education provided includes the following:  Suicide risk factors  Suicide prevention and interventions  National Suicide Hotline telephone number  [facility] assessment telephone number  [facility] Emergency Assistance 911  [facility] and/or Residential Mobile Crisis Unit telephone number  Request made of family/significant other to:  Remove weapons (e.g., guns, rifles, knives), all items previously/currently identified as safety concern.  Mother advised she has guns but patient does not have access to weapons.    Remove drugs/medications (over-the-counter, prescriptions, illicit drugs), all items previously/currently identified as a safety concern.  The family member/significant other verbalizes understanding of the suicide prevention education information provided.  The family member/significant other agrees to remove the items of safety concern listed above.  Wynn Banker 05/26/2013, 3:27 PM
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
## 2013-05-26 NOTE — Progress Notes (Signed)
D: Pt. Stated she had a good night sleep last night. Pt. Stated that she wants to go home and when she goes home she is going to take better care of herself. Pt. Attended groups. Denies SI/HI/AVH. Denies pain.   A: Q 15 min checks for safety. Support and encouragement offered. Scheduled medications given.  R: Pt. Remains safe on the unit. Appropriate interaction with staff and others.

## 2013-05-26 NOTE — Progress Notes (Signed)
The focus of this group is to educate the patient on the purpose and policies of crisis stabilization and provide a format to answer questions about their admission.  The group details unit policies and expectations of patients while admitted. Patient attended group but was called out by Child psychotherapist and then returned to group.  She did not speak but looked attentive.

## 2013-05-27 ENCOUNTER — Ambulatory Visit: Payer: Medicaid Other | Admitting: Family Medicine

## 2013-05-27 MED ORDER — ADULT MULTIVITAMIN W/MINERALS CH: 1.0000 | ORAL_TABLET | Freq: Every day | ORAL | Status: DC

## 2013-05-27 MED ORDER — CITALOPRAM HYDROBROMIDE 10 MG PO TABS: 10.0000 mg | ORAL_TABLET | Freq: Every day | ORAL | Status: DC

## 2013-05-27 MED ORDER — HYDROXYZINE HCL 25 MG PO TABS: 25.0000 mg | ORAL_TABLET | Freq: Every evening | ORAL | Status: DC | PRN

## 2013-05-27 MED ORDER — RISPERIDONE 0.5 MG PO TBDP: 0.5000 mg | ORAL_TABLET | Freq: Two times a day (BID) | ORAL | Status: DC

## 2013-05-27 MED ORDER — AMLODIPINE BESY-BENAZEPRIL HCL 10-40 MG PO CAPS: 1.0000 | ORAL_CAPSULE | Freq: Every day | ORAL | Status: DC

## 2013-05-27 MED ORDER — PRAVASTATIN SODIUM 40 MG PO TABS: 40.0000 mg | ORAL_TABLET | Freq: Every day | ORAL | Status: DC

## 2013-05-27 NOTE — [facility] Suicide Risk Assessment (Signed)
Suicide Risk Assessment  Discharge Assessment     Demographic Factors:  Adolescent or young adult, Low socioeconomic status and Unemployed  Mental Status Per Nursing Assessment::   On Admission:  Self-harm thoughts  Current Mental Status by Physician: Patient is calm and cooperative. Patient has good mood with the appropriate a bright affect. Patient has normal speech and thought process. Patient has no suicidal homicidal ideation intentions or plans. Patient has no evidence of psychotic symptoms.  Loss Factors: NA  Historical Factors: Impulsivity  Risk Reduction Factors:   Sense of responsibility to family, Religious beliefs about death, Living with another person, especially a relative, Positive social support, Positive therapeutic relationship and Positive coping skills or problem solving skills  Continued Clinical Symptoms:  Severe Anxiety and/or Agitation Depression:   Recent sense of peace/wellbeing Previous Psychiatric Diagnoses and Treatments Medical Diagnoses and Treatments/Surgeries  Cognitive Features That Contribute To Risk:  Polarized thinking    Suicide Risk:  Minimal: No identifiable suicidal ideation.  Patients presenting with no risk factors but with morbid ruminations; may be classified as minimal risk based on the severity of the depressive symptoms  Discharge Diagnoses:   AXIS I:  Major Depression, Recurrent severe AXIS II:  Deferred, borderline IQ/mild mental retardation AXIS III:   Past Medical History  Diagnosis Date  . Hypertension   . Asthma   . Depression   . Suicide attempt   . Mental retardation, mild (I.Q. 50-70)   . Hyperlipidemia    AXIS IV:  economic problems, occupational problems, other psychosocial or environmental problems, problems related to social environment and problems with primary support group AXIS V:  61-70 mild symptoms  Plan Of Care/Follow-up recommendations:  Activity:  As tolerated Diet:  Regular  Is patient on  multiple antipsychotic therapies at discharge:  No   Has Patient had three or more failed trials of antipsychotic monotherapy by history:  No  Recommended Plan for Multiple Antipsychotic Therapies: NA  Keri Veale,JANARDHAHA R. 05/27/2013, 12:35 PM

## 2013-05-27 NOTE — Progress Notes (Signed)
Patient ID: Kelli Walls, female   DOB: Dec 20, 1970, 42 y.o.   MRN: [id number] She has been discharged home and was picked up by her mother. Pt. Voiced understanding of discharge instruction and of follow up plan. She denies thoughts of harming self. All belonging taken home with her.

## 2013-05-27 NOTE — Progress Notes (Signed)
Adult Psychoeducational Group Note  Date:  05/27/2013 Time:  3:53 AM  Group Topic/Focus:  Wrap-Up Group:   The focus of this group is to help patients review their daily goal of treatment and discuss progress on daily workbooks.  Participation Level:  Active  Participation Quality:  Appropriate  Affect:  Appropriate  Cognitive:  Appropriate  Insight: Appropriate  Engagement in Group:  Engaged  Modes of Intervention:  Support  Additional Comments:  Patient attended and participated in group tonight. She reports having a good day. She had no depression or anxiety today. She attended her groups, went for meals and have been socializing by playing cards.  For her recovery she want to go back to school.  Lita Mains [facility] 05/27/2013, 3:53 AM

## 2013-05-27 NOTE — Discharge Summary (Signed)
Physician Discharge Summary Note  Patient:  Kelli Walls is an 42 y.o., female MRN:  [id number] DOB:  March 23, 1971 Patient phone:  [phone] (home)  Patient address:   [address] [place] [address]   Date of Admission:  05/23/2013 Date of Discharge: 05/27/2013  Discharge Diagnoses: Active Problems:   Major depression, recurrent  Axis Diagnosis:  AXIS I: Major Depression, Recurrent severe  AXIS II: Deferred, borderline IQ/mild mental retardation  AXIS III:  Past Medical History   Diagnosis  Date   .  Hypertension    .  Asthma    .  Depression    .  Suicide attempt    .  Mental retardation, mild (I.Q. 50-70)    .  Hyperlipidemia     AXIS IV: economic problems, occupational problems, other psychosocial or environmental problems, problems related to social environment and problems with primary support group  AXIS V: 61-70 mild symptoms  Level of Care:  OP  Hospital Course:   Kelli Walls is a 42 year old mildly retarded AAF who is accepted on transfer from the medical floor following an overdose in a suicide attempt. Kelli Walls stated that she had an argument with her BF and this was the key factor in her suicide attempt. She reported that she was more mad than depressed at the time but did note that many things had been bothering her for several weeks. She found that she has a lesion in her brain that could mean that she has MS.  While a patient in this hospital, Kelli Walls was enrolled in group counseling and activities as well as received the following medication Current facility-administered medications:acetaminophen (TYLENOL) tablet 650 mg, 650 mg, Oral, Q6H PRN, Kelli Means, NP;  alum & mag hydroxide-simeth (MAALOX/MYLANTA) 200-200-20 MG/5ML suspension 30 mL, 30 mL, Oral, Q4H PRN, Kelli Means, NP;  benazepril (LOTENSIN) tablet 40 mg, 40 mg, Oral, Daily, Kelli Means, NP, 40 mg at 05/27/13 0820;  citalopram (CELEXA) tablet 10 mg, 10 mg, Oral, Daily, Kelli Spurr, PA-C,  10 mg at 05/27/13 0820 hydrOXYzine (ATARAX/VISTARIL) tablet 25 mg, 25 mg, Oral, QHS PRN, Kelli Means, NP, 25 mg at 05/26/13 2118;  magnesium hydroxide (MILK OF MAGNESIA) suspension 30 mL, 30 mL, Oral, Daily PRN, Kelli Means, NP;  risperiDONE (RISPERDAL M-TABS) disintegrating tablet 0.5 mg, 0.5 mg, Oral, BID, Kelli Spurr, PA-C, 0.5 mg at 05/27/13 0820;  simvastatin (ZOCOR) tablet 40 mg, 40 mg, Oral, q1800, Kelli Means, NP, 40 mg at 05/26/13 1701 Current outpatient prescriptions:amLODipine-benazepril (LOTREL) 10-40 MG per capsule, Take 1 capsule by mouth daily., Disp: 30 capsule, Rfl: 0;  citalopram (CELEXA) 10 MG tablet, Take 1 tablet (10 mg total) by mouth daily., Disp: 30 tablet, Rfl: 0;  hydrOXYzine (ATARAX/VISTARIL) 25 MG tablet, Take 1 tablet (25 mg total) by mouth at bedtime as needed (sleep)., Disp: 30 tablet, Rfl: 0 Multiple Vitamin (MULTIVITAMIN WITH MINERALS) TABS tablet, Take 1 tablet by mouth daily., Disp: , Rfl: ;  pravastatin (PRAVACHOL) 40 MG tablet, Take 1 tablet (40 mg total) by mouth at bedtime., Disp: 30 tablet, Rfl: ;  risperiDONE (RISPERDAL M-TABS) 0.5 MG disintegrating tablet, Take 1 tablet (0.5 mg total) by mouth 2 (two) times daily., Disp: 60 tablet, Rfl: 0 SUMAtriptan (IMITREX) 50 MG tablet, Take 1 tablet (50 mg total) by mouth every 2 (two) hours as needed for migraine. May repeat in 2 hours if headache persists or recurs., Disp: 15 tablet, Rfl: 12  Patient attended treatment team meeting this am and met with treatment team  members. Pt symptoms, treatment plan and response to treatment discussed. [name] endorsed that their symptoms have improved. Pt also stated that they are stable for discharge.  In other to control Active Problems:   Major depression, recurrent , they will continue psychiatric care on outpatient basis. They will follow-up at  Follow-up Information   Follow up with [facility] On 06/02/2013. (Tuesday, June 02, 2013 at 8:30 AM)    Contact information:    [address] [place], [place]  [address]  [phone]    .  In addition they were instructed to take all your medications as prescribed by your mental healthcare provider, to report any adverse effects and or reactions from your medicines to your outpatient provider promptly, patient is instructed and cautioned to not engage in alcohol and or illegal drug use while on prescription medicines, in the event of worsening symptoms, patient is instructed to call the crisis hotline, 911 and or go to the nearest ED for appropriate evaluation and treatment of symptoms.   Upon discharge, patient adamantly denies suicidal, homicidal ideations, auditory, visual hallucinations and or delusional thinking. They left [facility] with all personal belongings in no apparent distress.  Consults:  See electronic record for details  Significant Diagnostic Studies:  See electronic record for details  Discharge Vitals:   Blood pressure 137/91, pulse 89, temperature 98.2 F (36.8 C), temperature source Oral, resp. rate 20, height 5\' 3"  (1.6 m), weight 93.441 kg (206 lb), last menstrual period 05/07/2013..  Mental Status Exam: See Mental Status Examination and Suicide Risk Assessment completed by Attending Physician prior to discharge.  Discharge destination:  Home  Is patient on multiple antipsychotic therapies at discharge:  No  Has Patient had three or more failed trials of antipsychotic monotherapy by history: N/A Recommended Plan for Multiple Antipsychotic Therapies: N/A      Future Appointments Provider Department Dept Phone   08/04/2013 3:00 PM Deatra Canter, FNP [facility] [phone]   10/19/2013 10:30 AM Nilda Riggs, NP [facility] [phone]       Medication List       Indication   amLODipine-benazepril 10-40 MG per capsule  Commonly known as:  LOTREL  Take 1 capsule by mouth daily.   Indication:  High Blood Pressure     citalopram 10 MG tablet   Commonly known as:  CELEXA  Take 1 tablet (10 mg total) by mouth daily.   Indication:  Depression     hydrOXYzine 25 MG tablet  Commonly known as:  ATARAX/VISTARIL  Take 1 tablet (25 mg total) by mouth at bedtime as needed (sleep).      multivitamin with minerals Tabs tablet  Take 1 tablet by mouth daily.   Indication:  Vitamin Deficiency     pravastatin 40 MG tablet  Commonly known as:  PRAVACHOL  Take 1 tablet (40 mg total) by mouth at bedtime.   Indication:  Disease involving Cholesterol Deposits in the Arteries     risperiDONE 0.5 MG disintegrating tablet  Commonly known as:  RISPERDAL M-TABS  Take 1 tablet (0.5 mg total) by mouth 2 (two) times daily.   Indication:  Easily Angered or Annoyed     SUMAtriptan 50 MG tablet  Commonly known as:  IMITREX  Take 1 tablet (50 mg total) by mouth every 2 (two) hours as needed for migraine. May repeat in 2 hours if headache persists or recurs.        Follow-up Information   Follow up  with [facility] On 06/02/2013. (Tuesday, June 02, 2013 at 8:30 AM)    Contact information:   [address] [place], [place]  [address]  [phone]     Follow-up recommendations:   Activities: Resume typical activities Diet: Resume typical diet Tests: none Other: Follow up with outpatient provider and report any side effects to out patient prescriber.  Comments:  Take all your medications as prescribed by your mental healthcare provider. Report any adverse effects and or reactions from your medicines to your outpatient provider promptly. Patient is instructed and cautioned to not engage in alcohol and or illegal drug use while on prescription medicines. In the event of worsening symptoms, patient is instructed to call the crisis hotline, 911 and or go to the nearest ED for appropriate evaluation and treatment of symptoms. Follow-up with your primary care provider for your other medical issues, concerns and or health care needs.  SignedFransisca Kaufmann  NP-C 05/27/2013 3:53 PM  Patient seen for psychiatric evaluation, completed suicide risk assessment, case discussed with the treatment team and developed disposition plan.Reviewed the information documented and agree with the treatment plan.  Dontae Minerva,JANARDHAHA R. 05/27/2013 4:42 PM

## 2013-05-27 NOTE — Progress Notes (Signed)
[facility] Adult Case Management Discharge Plan :  Will you be returning to the same living situation after discharge: Yes,  Patient to return home with mother At discharge, do you have transportation home? Yes, patient to arrange transportation. Do you have the ability to pay for your medications:  Yes, patient has Medicaid.  Release of information consent forms completed and in the chart;  Patient's signature needed at discharge.  Patient to Follow up at: Follow-up Information   Follow up with [facility] On 06/02/2013. (Tuesday, June 02, 2013 at 8:30 AM)    Contact information:   [address] [place], [place]  [address]  [phone]      Patient denies SI/HI:   Patient no longer endorsing SI/HI or other thoughts of self harm.    Safety Planning and Suicide Prevention discussed: .Reviewed with all patients during discharge planning group  Ebony Yorio, Joesph July 05/27/2013, 10:28 AM

## 2013-05-27 NOTE — Tx Team (Signed)
Interdisciplinary Treatment Plan Update   Date Reviewed:  05/27/2013  Time Reviewed:  9:45 AM  Progress in Treatment:   Attending groups: Yes Participating in groups: Yes Taking medication as prescribed: Yes  Tolerating medication: Yes Family/Significant other contact made: Yes, contact made with mother. Patient understands diagnosis: Yes  Discussing patient identified problems/goals with staff: Yes Medical problems stabilized or resolved: Yes Denies suicidal/homicidal ideation: Yes Patient has not harmed self or others: Yes  For review of initial/current patient goals, please see plan of care.  Estimated Length of Stay:  Discharge today  Reasons for Continued Hospitalization:   New Problems/Goals identified:    Discharge Plan or Barriers:   Home with outpatient follow up [facility]  Additional Comments:  N/A  Attendees:  Patient:  Kelli Walls 05/27/2013 9:45 AM   Signature: Mervyn Gay, MD 05/27/2013 9:45 AM  Signature:  Elliot Cousin, RN 05/27/2013 9:45 AM  Signature: Roswell Miners, RN 05/27/2013 9:45 AM  Signature: 05/27/2013 9:45 AM  Signature:   05/27/2013 9:45 AM  Signature:  Juline Patch, LCSW 05/27/2013 9:45 AM  Signature:  Reyes Ivan, LCSW 05/27/2013 9:45 AM  Signature:  Maseta Dorley,Care Coordinator 05/27/2013 9:45 AM  Signature:   05/27/2013 9:45 AM  Signature:  05/27/2013  9:45 AM  Signature:    05/27/2013  9:45 AM  Signature:   05/27/2013  9:45 AM    Scribe for Treatment Team:   Juline Patch,  05/27/2013 9:45 AM

## 2013-06-01 NOTE — Progress Notes (Signed)
Patient Discharge Instructions:  After Visit Summary (AVS):   Faxed to:  06/01/13 Discharge Summary Note:   Faxed to:  06/01/13 Psychiatric Admission Assessment Note:   Faxed to:  06/01/13 Suicide Risk Assessment - Discharge Assessment:   Faxed to:  06/01/13 Faxed/Sent to the Next Level Care provider:  06/01/13 Faxed to [facility] @ [phone]  Jerelene Redden, 06/01/2013, 3:09 PM

## 2013-08-04 ENCOUNTER — Ambulatory Visit (INDEPENDENT_AMBULATORY_CARE_PROVIDER_SITE_OTHER): Payer: Medicaid Other | Admitting: Family Medicine

## 2013-08-04 ENCOUNTER — Encounter: Payer: Self-pay | Admitting: Family Medicine

## 2013-08-04 VITALS — BP 128/82 | HR 107 | Temp 99.2°F | Ht 63.0 in | Wt 218.0 lb

## 2013-08-04 DIAGNOSIS — J069 Acute upper respiratory infection, unspecified: Secondary | ICD-10-CM

## 2013-08-04 MED ORDER — AZITHROMYCIN 250 MG PO TABS: ORAL_TABLET | ORAL | Status: DC

## 2013-08-04 NOTE — Progress Notes (Signed)
   Subjective:    Patient ID: Kelli Walls, female    DOB: 1970/12/04, 42 y.o.   MRN: [id number]  HPI This 42 y.o. female presents for evaluation of cough and uri sx's for over 3 days.   Review of Systems No chest pain, SOB, HA, dizziness, vision change, N/V, diarrhea, constipation, dysuria, urinary urgency or frequency, myalgias, arthralgias or rash.     Objective:   Physical Exam  Filed Vitals:   08/04/13 1518  BP: 128/82  Pulse: 107  Temp: 99.2 F (37.3 C)  TempSrc: Oral  Height: 5\' 3"  (1.6 m)  Weight: 218 lb (98.884 kg)   Vital signs noted  Well developed well nourished female.  HEENT - Head atraumatic Normocephalic                Eyes - PERRLA, Conjuctiva - clear Sclera- Clear EOMI                Ears - EAC's Wnl TM's Wnl Gross Hearing WNL                Nose - Nares boggy w/ decreased patency.                Throat - oropharanx wnl Respiratory - Lungs CTA bilateral Cardiac - RRR S1 and S2 without murmur GI - Abdomen soft Nontender and bowel sounds active x 4 Extremities - No edema. Neuro - Grossly intact.       Assessment & Plan:  URI (upper respiratory infection) - Plan: azithromycin (ZITHROMAX) 250 MG tablet Push po fluids, rest, tylenol and motrin otc prn as directed for fever, arthralgias, and myalgias.  Follow up prn if sx's continue or persist.  Deatra Canter FNP

## 2013-08-04 NOTE — Patient Instructions (Signed)

## 2013-09-10 ENCOUNTER — Telehealth: Payer: Self-pay | Admitting: Family Medicine

## 2013-09-11 NOTE — Telephone Encounter (Signed)
Please advise 

## 2013-09-14 ENCOUNTER — Other Ambulatory Visit: Payer: Self-pay | Admitting: Family Medicine

## 2013-09-14 DIAGNOSIS — L918 Other hypertrophic disorders of the skin: Secondary | ICD-10-CM

## 2013-10-19 ENCOUNTER — Encounter (INDEPENDENT_AMBULATORY_CARE_PROVIDER_SITE_OTHER): Payer: Self-pay

## 2013-10-19 ENCOUNTER — Ambulatory Visit (INDEPENDENT_AMBULATORY_CARE_PROVIDER_SITE_OTHER): Payer: Medicaid Other | Admitting: Nurse Practitioner

## 2013-10-19 ENCOUNTER — Encounter: Payer: Self-pay | Admitting: Nurse Practitioner

## 2013-10-19 VITALS — BP 148/96 | HR 87 | Ht 62.5 in | Wt 231.0 lb

## 2013-10-19 DIAGNOSIS — I10 Essential (primary) hypertension: Secondary | ICD-10-CM

## 2013-10-19 DIAGNOSIS — G43909 Migraine, unspecified, not intractable, without status migrainosus: Secondary | ICD-10-CM

## 2013-10-19 NOTE — Progress Notes (Signed)
[facility]  PATIENT: Kelli Walls DOB: 03-26-1971   REASON FOR VISIT: Followup for migraine   HISTORY OF PRESENT ILLNESS: Kelli Walls, 43 year old female returns for followup. She has a history of migraines and she was placed on Imitrex at her last visit with Dr. Krista Blue with fairly good results. In addition she has psychiatric issues and had a recent admission to behavioral health. She says stress causes more headaches. She is not aware of any other migraine triggers such as foods. She is having one to 2 headaches per month. She returns for reevaluation   HISTORY: evaluation of mild abnormal MRI scan on 04/20/13 by Dr. Krista Blue.  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  In March 24 2013, she had argument with her boyfriend, began to experience hypertension, numbness at her mouth, at her forehead, with associated severe headache, this leading to MRI of the brain, scattered subcortical T2 hyperintensities are likely within normal limits for age. The largest lesion is in the anterior left frontal lobe, measuring 8 mm.  She has a history of headaches since teenager, her headache are retrorbital area severe pounding headache with associated light noise sensitivity, pressure, she sees flashlight in her visual field, lasting for 15 minutes, headaches can last up to 6 hours, trigger for her headaches are stress, exertion, menstruation,  She took tylenol, ibuprofen, Excedrin Migraine are helpful too.    REVIEW OF SYSTEMS: Full 14 system review of systems performed and notable only for those listed, all others are neg:  Constitutional: N/A  Cardiovascular: N/A  Ear/Nose/Throat: N/A  Skin: N/A  Eyes: N/A  Respiratory: N/A    Gastroitestinal: N/A  Hematology/Lymphatic: N/A  Endocrine: N/A Musculoskeletal:N/A  Allergy/Immunology: N/A  Neurological: Headache Psychiatric: Anxiety   ALLERGIES: Allergies  Allergen Reactions  . Penicillins Itching    HOME MEDICATIONS: Outpatient Prescriptions Prior to Visit  Medication Sig Dispense Refill  . amLODipine-benazepril (LOTREL) 10-40 MG per capsule Take 1 capsule by mouth daily.  30 capsule  0  . azithromycin (ZITHROMAX) 250 MG tablet Take 2 po first day and the one po qd x 4 days  6 tablet  0  . citalopram (CELEXA) 10 MG tablet Take 1 tablet (10 mg total) by mouth daily.  30 tablet  0  . hydrOXYzine (ATARAX/VISTARIL) 25 MG tablet Take 1 tablet (25 mg total) by mouth at bedtime as needed (sleep).  30 tablet  0  . Multiple Vitamin (MULTIVITAMIN WITH MINERALS) TABS tablet Take 1 tablet by mouth daily.      . pravastatin (PRAVACHOL) 40 MG tablet Take 1 tablet (40 mg total) by mouth at bedtime.  30 tablet    . risperiDONE (RISPERDAL M-TABS) 0.5 MG disintegrating tablet Take 1 tablet (0.5 mg total) by mouth 2 (two) times daily.  60 tablet  0  . SUMAtriptan (IMITREX) 50 MG tablet Take 1 tablet (50 mg total) by mouth every 2 (two) hours as needed for migraine. May repeat in 2 hours if headache persists or recurs.  15 tablet  12   No facility-administered medications prior to visit.    PAST MEDICAL HISTORY: Past Medical History  Diagnosis Date  . Hypertension   . Asthma   . Depression   . Suicide  attempt   . Mental retardation, mild (I.Q. 50-70)   . Hyperlipidemia     PAST SURGICAL HISTORY: Past Surgical History  Procedure Laterality Date  . Tonsillectomy    . Endometrial ablation      FAMILY HISTORY: Family History  Problem Relation Age of Onset  . Diabetes Mother   . Hyperlipidemia Mother     SOCIAL HISTORY: History   Social History  . Marital Status: Divorced    Spouse Name: N/A    Number of Children: 0  . Years of Education: 12    Occupational History  . Unemployed     Social History Main Topics  . Smoking status: Never Smoker   . Smokeless tobacco: Never Used  . Alcohol Use: No     Comment: pt reports a history of drinking etoh but denies current use  . Drug Use: No  . Sexual Activity: No   Other Topics Concern  . Not on file   Social History Narrative   Patient lives at home with her mother Kelli Walls.    Patient is single.    Patient has no children.    Patient has 12th grade education.      PHYSICAL EXAM  Filed Vitals:   10/19/13 1031  BP: 148/96  Pulse: 87  Height: 5' 2.5" (1.588 m)  Weight: 231 lb (104.781 kg)   Body mass index is 41.55 kg/(m^2).  Generalized: Well developed, obese female in no acute distress  Neurological examination   Mentation: Alert oriented to time, place, history taking. Follows all commands speech and language fluent  Cranial nerve II-XII: Pupils were equal round reactive to light, she has difficulty with left horizontal movement and not the left abduction or adduction, visual field were full on confrontational test. Facial sensation and strength were normal. hearing was intact to finger rubbing bilaterally. Uvula tongue midline. head turning and shoulder shrug were normal and symmetric.Tongue protrusion into cheek strength was normal. Motor: normal bulk and tone, full strength in the BUE, BLE,  No focal weakness Coordination: finger-nose-finger, heel-to-shin bilaterally, no dysmetria Reflexes: Brachioradialis 2/2, biceps 2/2, triceps 2/2, patellar 2/2, Achilles 2/2, plantar responses were flexor bilaterally. Gait and Station: Rising up from seated position without assistance, normal stance,  moderate stride, good arm swing, smooth turning, able to perform tiptoe, and heel walking without difficulty. Tandem gait is steady  DIAGNOSTIC DATA (LABS, IMAGING, TESTING) - I reviewed patient records, labs, notes, testing and imaging myself where available.  Lab Results   Component Value Date   WBC 8.5 05/22/2013   HGB 12.1 05/22/2013   HCT 35.8* 05/22/2013   MCV 85.6 05/22/2013   PLT 280 05/22/2013      Component Value Date/Time   NA 136 05/23/2013 0434   K 4.2 05/23/2013 0434   CL 104 05/23/2013 0434   CO2 24 05/23/2013 0434   GLUCOSE 91 05/23/2013 0434   BUN 8 05/23/2013 0434   CREATININE 1.02 05/23/2013 0434   CREATININE 1.12* 12/19/2012 1017   CALCIUM 8.8 05/23/2013 0434   PROT 6.6 05/23/2013 0434   ALBUMIN 3.3* 05/23/2013 0434   AST 13 05/23/2013 0434   ALT 10 05/23/2013 0434   ALKPHOS 35* 05/23/2013 0434   BILITOT 0.2* 05/23/2013 0434   GFRNONAA 68* 05/23/2013 0434   GFRAA 79* 05/23/2013 0434   Lab Results  Component Value Date   CHOL 254* 12/19/2012   HDL 46 12/19/2012   LDLCALC 174* 12/19/2012   TRIG 171* 12/19/2012   CHOLHDL 5.5 12/19/2012  Lab Results  Component Value Date   HGBA1C 5.3% 12/19/2012    ASSESSMENT AND PLAN  43 y.o. year old female  has a past medical history of angry outbursts, mild mental retardation, migraine headaches and mildly abnormal MRI scan with one 1mm left-sided frontal area of T2 lesion which is most likely migraine related. Sumatriptan has worked well  Continue sumatriptan at current dose for acute headache Given list of foods and other triggers that are common Followup in 6 months Dennie Bible, [facility], [facility], [facility] [address], [place], [place] [address] [phone]

## 2013-10-19 NOTE — Patient Instructions (Signed)
Continue sumatriptan at current dose for acute headache Given list of foods and other triggers that are common Followup in 6 months

## 2013-12-21 ENCOUNTER — Other Ambulatory Visit: Payer: Self-pay

## 2013-12-21 DIAGNOSIS — Z1231 Encounter for screening mammogram for malignant neoplasm of breast: Secondary | ICD-10-CM

## 2014-01-28 ENCOUNTER — Ambulatory Visit
Admission: RE | Admit: 2014-01-28 | Discharge: 2014-01-28 | Disposition: A | Discharge status: Home / Self Care | Payer: Medicaid Other | Source: Ambulatory Visit

## 2014-01-28 DIAGNOSIS — Z1231 Encounter for screening mammogram for malignant neoplasm of breast: Secondary | ICD-10-CM

## 2014-03-05 ENCOUNTER — Other Ambulatory Visit: Payer: Self-pay | Admitting: Family Medicine

## 2014-03-08 NOTE — Telephone Encounter (Signed)
Last ov 12/14. 

## 2014-03-15 ENCOUNTER — Other Ambulatory Visit: Payer: Self-pay | Admitting: Family Medicine

## 2014-04-21 ENCOUNTER — Ambulatory Visit: Payer: Medicaid Other | Admitting: Nurse Practitioner

## 2014-07-15 ENCOUNTER — Encounter: Payer: Self-pay | Admitting: Neurology

## 2014-07-15 ENCOUNTER — Ambulatory Visit (INDEPENDENT_AMBULATORY_CARE_PROVIDER_SITE_OTHER): Payer: Medicaid Other | Admitting: Neurology

## 2014-07-15 VITALS — BP 162/104 | HR 81 | Ht 62.0 in | Wt 245.0 lb

## 2014-07-15 DIAGNOSIS — G43909 Migraine, unspecified, not intractable, without status migrainosus: Secondary | ICD-10-CM

## 2014-07-15 MED ORDER — SUMATRIPTAN SUCCINATE 50 MG PO TABS: 50.0000 mg | ORAL_TABLET | ORAL | Status: DC | PRN

## 2014-07-15 NOTE — Progress Notes (Signed)
[facility]  PATIENT: Kelli Walls DOB: [birth year]  HISTORICAL Kelli Walls is a 43 yo RH  African American female, accompanied by her mother, referred by her primary care physician Dr. Redge Gainer for evaluation of mild abnormal MRI scan  She was born only 4 pounds, developmentally delayed, she learned to walk at 35 and 43 years old, was diagnosed with mild mental retardation, she graduated from special grade school at 12th grade, lives with her mother now, she has frequent raging outburst when she was younger, which is even worse now.  In March 24 2013, she had argument with her boyfriend, began to experience elevated blood pressure, numbness at her mouth, at her forehead, with associated severe headache, this led to MRI of the brain, scattered subcortical T2 hyperintensities are likely within normal limits for age. The largest lesion is in the anterior left frontal lobe, measuring 8 mm.   She has a history of headaches since teenager, her headache are retrorbital area severe pounding headache with associated light noise sensitivity, pressure, she sees flashlight in her visual field, lasting for 15 minutes, headaches can last up to 6 hours, trigger for her headaches are stress, exertion, menstruation,  She took tylenol, ibuprofen, Excedrin Migraine are helpful too.   UPDATE 07/15/2014:  Her headaches has much improved,  only a couple times a month, sumatriptan has been very helpful,  However, she has stopped taking her medications including antihypertension, antidepression medications without medical advice, she has missed her psychiatry appointment, she denies significant depression at this point.    REVIEW OF SYSTEMS: Full 14 system review of systems performed and notable only for  appetite change, excessive eating, snoring  ALLERGIES: Allergies  Allergen Reactions  . Penicillins Itching    HOME MEDICATIONS: Outpatient Prescriptions Prior to Visit  Medication  Sig Dispense Refill  . Multiple Vitamin (MULTIVITAMIN WITH MINERALS) TABS tablet Take 1 tablet by mouth daily.    Marland Kitchen amLODipine-benazepril (LOTREL) 10-40 MG per capsule Take 1 capsule by mouth daily. (Patient not taking: Reported on 07/15/2014) 30 capsule 0  . amLODipine-benazepril (LOTREL) 10-40 MG per capsule TAKE ONE CAPSULE BY MOUTH ONCE DAILY (Patient not taking: Reported on 07/15/2014) 30 capsule 0  . azithromycin (ZITHROMAX) 250 MG tablet Take 2 po first day and the one po qd x 4 days (Patient not taking: Reported on 07/15/2014) 6 tablet 0  . citalopram (CELEXA) 10 MG tablet Take 1 tablet (10 mg total) by mouth daily. (Patient not taking: Reported on 07/15/2014) 30 tablet 0  . hydrOXYzine (ATARAX/VISTARIL) 25 MG tablet Take 1 tablet (25 mg total) by mouth at bedtime as needed (sleep). (Patient not taking: Reported on 07/15/2014) 30 tablet 0  . pravastatin (PRAVACHOL) 40 MG tablet Take 1 tablet (40 mg total) by mouth at bedtime. (Patient not taking: Reported on 07/15/2014) 30 tablet   . risperiDONE (RISPERDAL M-TABS) 0.5 MG disintegrating tablet Take 1 tablet (0.5 mg total) by mouth 2 (two) times daily. (Patient not taking: Reported on 07/15/2014) 60 tablet 0  . SUMAtriptan (IMITREX) 50 MG tablet Take 1 tablet (50 mg total) by mouth every 2 (two) hours as needed for migraine. May repeat in 2 hours if headache persists or recurs. (Patient not taking: Reported on 07/15/2014) 15 tablet 12   No facility-administered medications prior to visit.    PAST MEDICAL HISTORY: Past Medical History  Diagnosis Date  . Hypertension   . Asthma   . Depression     PAST SURGICAL HISTORY: Past Surgical History  Procedure Laterality Date  . Tonsillectomy    . Endometrial ablation      FAMILY HISTORY: Family History  Problem Relation Age of Onset  . Diabetes Mother   . Hyperlipidemia Mother     SOCIAL HISTORY:  History   Social History  . Marital Status: Legally Separated    Spouse Name: N/A      Number of Children: N/A  . Years of Education: N/A    Social History Main Topics  . Smoking status: Never Smoker   . Smokeless tobacco: Never Used  . Alcohol Use: No  . Drug Use: No  . Sexual Activity: Not on file    Social History Narrative   Patient lives at home with her mother Kelli Walls.    Patient is single.    Patient has no children.    Patient has 12th grade education.    PHYSICAL EXAM    Filed Vitals:   07/15/14 1027  BP: 162/104  Pulse: 81  Height: 5\' 2"  (1.575 m)  Weight: 245 lb (111.131 kg)   Body mass index is 44.8 kg/(m^2).   Generalized: In no acute distress  Neck: Supple, no carotid bruits   Cardiac: Regular rate rhythm  Pulmonary: Clear to auscultation bilaterally  Musculoskeletal: No deformity  Neurological examination  Mentation: Alert oriented to time, place, history taking, and causual conversation  Cranial nerve II-XII: Pupils were equal round reactive to light. visual field were full on confrontational test. She has difficulty with left eye horizontal movement, could not do left eye abduction or adduction. She also has difficulty with right eye abduction. Mild asymmetry, underdeveloped right face. hearing was intact to finger rubbing bilaterally. Uvula tongue midline.  head turning and shoulder shrug and were normal and symmetric.Tongue protrusion into cheek strength was normal.  Motor: normal tone, bulk and strength.  Sensory: Intact to fine touch, pinprick, preserved vibratory sensation, and proprioception at toes.  Coordination: Normal finger to nose, heel-to-shin bilaterally there was no truncal ataxia  Gait: Rising up from seated position without assistance, normal stance, without trunk ataxia, moderate stride, good arm swing, smooth turning, able to perform tiptoe, and heel walking without difficulty.   Romberg signs: Negative  Deep tendon reflexes: Brachioradialis 2/2, biceps 2/2, triceps 2/2, patellar 2/2, Achilles 2/2, plantar  responses were flexor bilaterally.   DIAGNOSTIC DATA (LABS, IMAGING, TESTING) - I reviewed patient records, labs, notes, testing and imaging myself where available.  Lab Results  Component Value Date   WBC 8.5 05/22/2013   HGB 12.1 05/22/2013   HCT 35.8* 05/22/2013   MCV 85.6 05/22/2013   PLT 280 05/22/2013      Component Value Date/Time   NA 136 05/23/2013 0434   K 4.2 05/23/2013 0434   CL 104 05/23/2013 0434   CO2 24 05/23/2013 0434   GLUCOSE 91 05/23/2013 0434   BUN 8 05/23/2013 0434   CREATININE 1.02 05/23/2013 0434   CREATININE 1.12* 12/19/2012 1017   CALCIUM 8.8 05/23/2013 0434   PROT 6.6 05/23/2013 0434   ALBUMIN 3.3* 05/23/2013 0434   AST 13 05/23/2013 0434   ALT 10 05/23/2013 0434   ALKPHOS 35* 05/23/2013 0434   BILITOT 0.2* 05/23/2013 0434   GFRNONAA 68* 05/23/2013 0434   GFRAA 79* 05/23/2013 0434   Lab Results  Component Value Date   CHOL 254* 12/19/2012   HDL 46 12/19/2012   LDLCALC 174* 12/19/2012   TRIG 171* 12/19/2012   CHOLHDL 5.5 12/19/2012   Lab Results  Component Value Date  HGBA1C 5.3% 12/19/2012     ASSESSMENT AND PLAN   43 years old right-handed Caucasian female, with history of angry outbursts, mild mental retardation, migraine headaches, now presenting with mild abnormal MRI scan, one 8 mm left side frontal area T2 lesions, which most likely migraine related  1.Imitrex 50 mg as needed for migraine headaches 2. She has stopped her medications, noted elevated blood pressure 160/100 today, I have advised her continue follow-up with her primary care physician, psychiatrist, 3. Return to clinic in one year with Rhae Hammock, M.D. Ph.D.  [facility] [address], [place], [place] [address] [phone]

## 2014-09-15 ENCOUNTER — Encounter: Payer: Self-pay | Admitting: Family Medicine

## 2014-09-15 ENCOUNTER — Ambulatory Visit (INDEPENDENT_AMBULATORY_CARE_PROVIDER_SITE_OTHER): Payer: Medicaid Other | Admitting: Family Medicine

## 2014-09-15 VITALS — BP 192/145 | HR 93 | Temp 97.3°F | Ht 62.0 in | Wt 239.4 lb

## 2014-09-15 DIAGNOSIS — I10 Essential (primary) hypertension: Secondary | ICD-10-CM

## 2014-09-15 DIAGNOSIS — K1120 Sialoadenitis, unspecified: Secondary | ICD-10-CM

## 2014-09-15 MED ORDER — DOXYCYCLINE HYCLATE 100 MG PO TABS: 100.0000 mg | ORAL_TABLET | Freq: Two times a day (BID) | ORAL | Status: DC

## 2014-09-15 MED ORDER — LOSARTAN POTASSIUM-HCTZ 100-25 MG PO TABS: 1.0000 | ORAL_TABLET | Freq: Every day | ORAL | Status: DC

## 2014-09-15 MED ORDER — AMLODIPINE BESYLATE 10 MG PO TABS: 10.0000 mg | ORAL_TABLET | Freq: Every day | ORAL | Status: DC

## 2014-09-15 NOTE — Progress Notes (Signed)
   Subjective:    Patient ID: Kelli Walls, female    DOB: 1971/06/27, 44 y.o.   MRN: [id number]  HPI Patient is here for left facial swelling and discomfort.  She denies any pain with chewing or biting down.  She has stopped her BP meds because it was making her cough.  Review of Systems  Constitutional: Negative for fever.  HENT: Negative for ear pain.   Eyes: Negative for discharge.  Respiratory: Negative for cough.   Cardiovascular: Negative for chest pain.  Gastrointestinal: Negative for abdominal distention.  Endocrine: Negative for polyuria.  Genitourinary: Negative for difficulty urinating.  Musculoskeletal: Negative for gait problem and neck pain.  Skin: Negative for color change and rash.  Neurological: Negative for speech difficulty and headaches.  Psychiatric/Behavioral: Negative for agitation.       Objective:    BP 192/145 mmHg  Pulse 93  Temp(Src) 97.3 F (36.3 C) (Oral)  Ht 5\' 2"  (1.575 m)  Wt 239 lb 6.4 oz (108.591 kg)  BMI 43.78 kg/m2  LMP 09/07/2014 Physical Exam  Constitutional: She is oriented to person, place, and time. She appears well-developed and well-nourished.  HENT:  Head: Normocephalic and atraumatic.  Mouth/Throat: Oropharynx is clear and moist.  Eyes: Pupils are equal, round, and reactive to light.  Neck: Normal range of motion. Neck supple.  Cardiovascular: Normal rate and regular rhythm.   No murmur heard. Pulmonary/Chest: Effort normal and breath sounds normal.  Abdominal: Soft. Bowel sounds are normal. There is no tenderness.  Neurological: She is alert and oriented to person, place, and time.  Skin: Skin is warm and dry.  Psychiatric: She has a normal mood and affect.          Assessment & Plan:     ICD-9-CM ICD-10-CM   1. Essential hypertension 401.9 I10 amLODipine (NORVASC) 10 MG tablet     losartan-hydrochlorothiazide (HYZAAR) 100-25 MG per tablet  2. Salivary gland infection 527.2 K11.20 doxycycline (VIBRA-TABS) 100 MG  tablet  Discussed sucking on lemons and massaging her left face and can try sour candy to help.  May need to see ENT if not better.   No Follow-up on file.  Lysbeth Penner FNP

## 2014-09-15 NOTE — Patient Instructions (Signed)
Parotitis °Parotitis is soreness and inflammation of one or both parotid glands. The parotid glands produce saliva. They are located on each side of the face, below and in front of the earlobes. The saliva produced comes out of tiny openings (ducts) inside the cheeks. In most cases, parotitis goes away over time or with treatment. If your parotitis is caused by certain long-term (chronic) diseases, it may come back again.  °CAUSES  °Parotitis can be caused by: °· Viral infections. Mumps is one viral infection that can cause parotitis. °· Bacterial infections. °· Blockage of the salivary ducts due to a salivary stone. °· Narrowing of the salivary ducts. °· Swelling of the salivary ducts. °· Dehydration. °· Autoimmune conditions, such as sarcoidosis or Sjogren syndrome. °· Air from activities such as scuba diving, glass blowing, or playing an instrument (rare). °· Human immunodeficiency virus (HIV) or acquired immunodeficiency syndrome (AIDS). °· Tuberculosis. °SIGNS AND SYMPTOMS  °· The ears may appear to be pushed up and out from their normal position. °· Redness (erythema) of the skin over the parotid glands. °· Pain and tenderness over the parotid glands. °· Swelling in the parotid gland area. °· Yellowish-white fluid (pus) coming from the ducts inside the cheeks. °· Dry mouth. °· Bad taste in the mouth. °DIAGNOSIS  °Your health care provider may determine that you have parotitis based on your symptoms and a physical exam. A sample of fluid may also be taken from the parotid gland and tested to find the cause of your infection. X-rays or computed tomography (CT) scans may be taken if your health care provider thinks you might have a salivary stone blocking your salivary duct. °TREATMENT  °Treatment varies depending upon the cause of your parotitis. If your parotitis is caused by mumps, no treatment is needed. The condition will go away on its own after 7 to 10 days. In other cases, treatment may  include: °· Antibiotic medicine if your infection was caused by bacteria. °· Pain medicines. °· Gland massage. °· Eating sour candy to increase your saliva production. °· Removal of salivary stones. Your health care provider may flush stones out with fluids or remove them with tweezers. °· Surgery to remove the parotid glands. °HOME CARE INSTRUCTIONS  °· If you were prescribed an antibiotic medicine, finish it all even if you start to feel better. °· Put warm compresses on the sore area. °· Take medicines only as directed by your health care provider. °· Drink enough fluids to keep your urine clear or pale yellow. °SEEK IMMEDIATE MEDICAL CARE IF:  °· You have increasing pain or swelling that is not controlled with medicine. °· You have a fever. °MAKE SURE YOU: °· Understand these instructions. °· Will watch your condition. °· Will get help right away if you are not doing well or get worse. °Document Released: 01/12/2002 Document Revised: 12/07/2013 Document Reviewed: 06/18/2011 °[employer]® Patient Information ©2015 [employer]. This information is not intended to replace advice given to you by your health care provider. Make sure you discuss any questions you have with your health care provider. ° °

## 2014-09-23 ENCOUNTER — Encounter: Payer: Self-pay | Admitting: Family

## 2014-09-23 ENCOUNTER — Ambulatory Visit (INDEPENDENT_AMBULATORY_CARE_PROVIDER_SITE_OTHER): Payer: Medicaid Other | Admitting: Family

## 2014-09-23 VITALS — BP 147/101 | HR 111 | Temp 99.2°F | Ht 62.0 in | Wt 238.2 lb

## 2014-09-23 DIAGNOSIS — I10 Essential (primary) hypertension: Secondary | ICD-10-CM

## 2014-09-23 DIAGNOSIS — K1120 Sialoadenitis, unspecified: Secondary | ICD-10-CM

## 2014-09-23 MED ORDER — METOPROLOL TARTRATE 50 MG PO TABS: 50.0000 mg | ORAL_TABLET | Freq: Two times a day (BID) | ORAL | Status: DC

## 2014-09-23 NOTE — Progress Notes (Signed)
   Subjective:    Patient ID: Kelli Walls, female    DOB: 1970/08/25, 44 y.o.   MRN: [id number]  HPI Pt presents to the office for left sided face swelling. Pt was seen in the office by Dietrich Pates FNP on 2/10 and was diagnosed with  A salivary gland infection. Pt was given a rx of doxycycline 100 mg. Pt states her face is the same and the swelling has not decreased. Pt denies any headache, palpitations, or SOB at this time.      Review of Systems  Constitutional: Negative.   HENT: Negative.   Eyes: Negative.   Respiratory: Negative.  Negative for shortness of breath.   Cardiovascular: Negative.  Negative for palpitations.  Gastrointestinal: Negative.   Endocrine: Negative.   Genitourinary: Negative.   Musculoskeletal: Negative.   Neurological: Negative.  Negative for headaches.  Hematological: Negative.   Psychiatric/Behavioral: Negative.   All other systems reviewed and are negative.      Objective:   Physical Exam  Constitutional: She is oriented to person, place, and time. She appears well-developed and well-nourished. No distress.  HENT:  Head: Normocephalic and atraumatic.  Right Ear: External ear normal.  Mouth/Throat: Oropharynx is clear and moist.  Eyes: Pupils are equal, round, and reactive to light.  Neck: Normal range of motion. Neck supple. No thyromegaly present.  Cardiovascular: Normal rate, regular rhythm, normal heart sounds and intact distal pulses.   No murmur heard. Pulmonary/Chest: Effort normal and breath sounds normal. No respiratory distress. She has no wheezes.  Abdominal: Soft. Bowel sounds are normal. She exhibits no distension. There is no tenderness.  Musculoskeletal: Normal range of motion. She exhibits no edema or tenderness.  Neurological: She is alert and oriented to person, place, and time. She has normal reflexes. No cranial nerve deficit.  Skin: Skin is warm and dry.  Psychiatric: She has a normal mood and affect. Her behavior is normal.  Judgment and thought content normal.  Vitals reviewed.    BP 147/101 mmHg  Pulse 111  Temp(Src) 99.2 F (37.3 C) (Oral)  Ht $R'5\' 2"'fp$  (1.575 m)  Wt 238 lb 3.2 oz (108.047 kg)  BMI 43.56 kg/m2  LMP 09/07/2014      Assessment & Plan:  1. Salivary gland infection -Warm compresses -Finish antibiotics -Suck on Sour candy or lemons  Ambulatory referral to ENT  2. Essential hypertension - metoprolol (LOPRESSOR) 50 MG tablet; Take 1 tablet (50 mg total) by mouth 2 (two) times daily.  Dispense: 180 tablet; Refill: 3 - [id number]+EGFR   Continue all meds Labs pending Health Maintenance reviewed Diet and exercise encouraged RTO 2 weeks for HTN  Evelina Dun, FNP

## 2014-09-23 NOTE — Patient Instructions (Addendum)
Salivary Gland Infection A salivary gland infection can be caused by a virus, bacteria from the mouth, or a stone. Mumps and other viruses may settle in one or more of the saliva glands. This will result in swelling, pain, and difficulty eating. Bacteria may cause a more severe infection in a salivary gland. A salivary stone blocking the flow of saliva can make this worse. These infections may be related to other medical problems. Some of these are dehydration, recent surgery, poor nutrition, and some medications. TREATMENT  Treatment of a salivary gland infection depends on the cause. Mumps and other virus infections do not require antibiotics. If bacteria cause the infection, then antibiotics are needed to get rid of the infection. If there is a salivary stone blocking the duct, minor surgery to remove the stone may be needed.  HOME CARE INSTRUCTIONS   Get plenty of rest, increase your fluids, and use warm compresses on the swollen area for 15 to 20 minutes 4 times per day or as often as feels good to you.  Suck on hard candy or chew sugarless gum to promote saliva production.  Only take over-the-counter or prescription medicines for pain, discomfort, or fever as directed by your caregiver. SEEK IMMEDIATE MEDICAL CARE IF:   You have increased swelling or pain or pain not relieved with medications.  You develop chills or a fever.  Any of your problems are getting worse rather than better. Document Released: 08/30/2004 Document Revised: 10/15/2011 Document Reviewed: 07/23/2005 [facility] Patient Information 2015 [place], [place]. This information is not intended to replace advice given to you by your health care provider. Make sure you discuss any questions you have with your health care provider.  Salivary Stone Your exam shows you have a stone in one of your saliva glands. These small stones form around a mucous plug in the ducts of the glands and cause the saliva in the gland to be blocked.  This makes the gland swollen and painful, especially when you eat. If repeated episodes occur, the gland can become infected. Sometimes these stones can be seen on x-ray. Treatment includes stimulating the production of saliva to push the stone out. You should suck on a lemon or sour candies several times daily. Antibiotic medicine may be needed if the gland is infected. Increasing fluids, applying warm compresses to the swollen area 3-4 times daily, and massaging the gland from back to front may encourage drainage and passage of the stone. Surgical treatment to remove the stone is sometimes necessary, so proper medical follow up is very important. Call your doctor for an appointment as recommended. Call right away if you have a high fever, severe headache, vomiting, uncontrolled pain, or other serious symptoms. Document Released: 08/30/2004 Document Revised: 10/15/2011 Document Reviewed: 07/23/2005 [facility] Patient Information 2015 [place], [place]. This information is not intended to replace advice given to you by your health care provider. Make sure you discuss any questions you have with your health care provider.  Hypertension Hypertension, commonly called high blood pressure, is when the force of blood pumping through your arteries is too strong. Your arteries are the blood vessels that carry blood from your heart throughout your body. A blood pressure reading consists of a higher number over a lower number, such as 110/72. The higher number (systolic) is the pressure inside your arteries when your heart pumps. The lower number (diastolic) is the pressure inside your arteries when your heart relaxes. Ideally you want your blood pressure below 120/80. Hypertension forces your heart  to work harder to pump blood. Your arteries may become narrow or stiff. Having hypertension puts you at risk for heart disease, stroke, and other problems.  RISK FACTORS Some risk factors for high blood pressure are  controllable. Others are not.  Risk factors you cannot control include:   Race. You may be at higher risk if you are African American.  Age. Risk increases with age.  Gender. Men are at higher risk than women before age 54 years. After age 38, women are at higher risk than men. Risk factors you can control include:  Not getting enough exercise or physical activity.  Being overweight.  Getting too much fat, sugar, calories, or salt in your diet.  Drinking too much alcohol. SIGNS AND SYMPTOMS Hypertension does not usually cause signs or symptoms. Extremely high blood pressure (hypertensive crisis) may cause headache, anxiety, shortness of breath, and nosebleed. DIAGNOSIS  To check if you have hypertension, your health care provider will measure your blood pressure while you are seated, with your arm held at the level of your heart. It should be measured at least twice using the same arm. Certain conditions can cause a difference in blood pressure between your right and left arms. A blood pressure reading that is higher than normal on one occasion does not mean that you need treatment. If one blood pressure reading is high, ask your health care provider about having it checked again. TREATMENT  Treating high blood pressure includes making lifestyle changes and possibly taking medicine. Living a healthy lifestyle can help lower high blood pressure. You may need to change some of your habits. Lifestyle changes may include:  Following the DASH diet. This diet is high in fruits, vegetables, and whole grains. It is low in salt, red meat, and added sugars.  Getting at least 2 hours of brisk physical activity every week.  Losing weight if necessary.  Not smoking.  Limiting alcoholic beverages.  Learning ways to reduce stress. If lifestyle changes are not enough to get your blood pressure under control, your health care provider may prescribe medicine. You may need to take more than one.  Work closely with your health care provider to understand the risks and benefits. HOME CARE INSTRUCTIONS  Have your blood pressure rechecked as directed by your health care provider.   Take medicines only as directed by your health care provider. Follow the directions carefully. Blood pressure medicines must be taken as prescribed. The medicine does not work as well when you skip doses. Skipping doses also puts you at risk for problems.   Do not smoke.   Monitor your blood pressure at home as directed by your health care provider. SEEK MEDICAL CARE IF:   You think you are having a reaction to medicines taken.  You have recurrent headaches or feel dizzy.  You have swelling in your ankles.  You have trouble with your vision. SEEK IMMEDIATE MEDICAL CARE IF:  You develop a severe headache or confusion.  You have unusual weakness, numbness, or feel faint.  You have severe chest or abdominal pain.  You vomit repeatedly.  You have trouble breathing. MAKE SURE YOU:   Understand these instructions.  Will watch your condition.  Will get help right away if you are not doing well or get worse. Document Released: 07/23/2005 Document Revised: 12/07/2013 Document Reviewed: 05/15/2013 [facility] Patient Information 2015 [place], [place]. This information is not intended to replace advice given to you by your health care provider. Make sure you discuss any  questions you have with your health care provider. DASH Eating Plan DASH stands for "Dietary Approaches to Stop Hypertension." The DASH eating plan is a healthy eating plan that has been shown to reduce high blood pressure (hypertension). Additional health benefits may include reducing the risk of type 2 diabetes mellitus, heart disease, and stroke. The DASH eating plan may also help with weight loss. WHAT DO I NEED TO KNOW ABOUT THE DASH EATING PLAN? For the DASH eating plan, you will follow these general guidelines:  Choose foods with  a percent daily value for sodium of less than 5% (as listed on the food label).  Use salt-free seasonings or herbs instead of table salt or sea salt.  Check with your health care provider or pharmacist before using salt substitutes.  Eat lower-sodium products, often labeled as "lower sodium" or "no salt added."  Eat fresh foods.  Eat more vegetables, fruits, and low-fat dairy products.  Choose whole grains. Look for the word "whole" as the first word in the ingredient list.  Choose fish and skinless chicken or [place] more often than red meat. Limit fish, poultry, and meat to 6 oz (170 g) each day.  Limit sweets, desserts, sugars, and sugary drinks.  Choose heart-healthy fats.  Limit cheese to 1 oz (28 g) per day.  Eat more home-cooked food and less restaurant, buffet, and fast food.  Limit fried foods.  Cook foods using methods other than frying.  Limit canned vegetables. If you do use them, rinse them well to decrease the sodium.  When eating at a restaurant, ask that your food be prepared with less salt, or no salt if possible. WHAT FOODS CAN I EAT? Seek help from a dietitian for individual calorie needs. Grains Whole grain or whole wheat bread. Brown rice. Whole grain or whole wheat pasta. Quinoa, bulgur, and whole grain cereals. Low-sodium cereals. Corn or whole wheat flour tortillas. Whole grain cornbread. Whole grain crackers. Low-sodium crackers. Vegetables Fresh or frozen vegetables (raw, steamed, roasted, or grilled). Low-sodium or reduced-sodium tomato and vegetable juices. Low-sodium or reduced-sodium tomato sauce and paste. Low-sodium or reduced-sodium canned vegetables.  Fruits All fresh, canned (in natural juice), or frozen fruits. Meat and Other Protein Products Ground beef (85% or leaner), grass-fed beef, or beef trimmed of fat. Skinless chicken or [place]. Ground chicken or [place]. Pork trimmed of fat. All fish and seafood. Eggs. Dried beans, peas, or lentils.  Unsalted nuts and seeds. Unsalted canned beans. Dairy Low-fat dairy products, such as skim or 1% milk, 2% or reduced-fat cheeses, low-fat ricotta or cottage cheese, or plain low-fat yogurt. Low-sodium or reduced-sodium cheeses. Fats and Oils Tub margarines without trans fats. Light or reduced-fat mayonnaise and salad dressings (reduced sodium). Avocado. Safflower, olive, or canola oils. Natural peanut or almond butter. Other Unsalted popcorn and pretzels. The items listed above may not be a complete list of recommended foods or beverages. Contact your dietitian for more options. WHAT FOODS ARE NOT RECOMMENDED? Grains White bread. White pasta. White rice. Refined cornbread. Bagels and croissants. Crackers that contain trans fat. Vegetables Creamed or fried vegetables. Vegetables in a cheese sauce. Regular canned vegetables. Regular canned tomato sauce and paste. Regular tomato and vegetable juices. Fruits Dried fruits. Canned fruit in light or heavy syrup. Fruit juice. Meat and Other Protein Products Fatty cuts of meat. Ribs, chicken wings, bacon, sausage, bologna, salami, chitterlings, fatback, hot dogs, bratwurst, and packaged luncheon meats. Salted nuts and seeds. Canned beans with salt. Dairy Whole or 2% milk, cream,  half-and-half, and cream cheese. Whole-fat or sweetened yogurt. Full-fat cheeses or blue cheese. Nondairy creamers and whipped toppings. Processed cheese, cheese spreads, or cheese curds. Condiments Onion and garlic salt, seasoned salt, table salt, and sea salt. Canned and packaged gravies. Worcestershire sauce. Tartar sauce. Barbecue sauce. Teriyaki sauce. Soy sauce, including reduced sodium. Steak sauce. Fish sauce. Oyster sauce. Cocktail sauce. Horseradish. Ketchup and mustard. Meat flavorings and tenderizers. Bouillon cubes. Hot sauce. Tabasco sauce. Marinades. Taco seasonings. Relishes. Fats and Oils Butter, stick margarine, lard, shortening, ghee, and bacon fat. Coconut,  palm kernel, or palm oils. Regular salad dressings. Other Pickles and olives. Salted popcorn and pretzels. The items listed above may not be a complete list of foods and beverages to avoid. Contact your dietitian for more information. WHERE CAN I FIND MORE INFORMATION? National [employer], [employer], [employer] Institute: travelstabloid.com Document Released: 07/12/2011 Document Revised: 12/07/2013 Document Reviewed: 05/27/2013 [facility] Patient Information 2015 [place], [place]. This information is not intended to replace advice given to you by your health care provider. Make sure you discuss any questions you have with your health care provider.

## 2014-09-24 LAB — CMP14+EGFR
ALT: 10 IU/L (ref 0–32)
AST: 13 IU/L (ref 0–40)
Albumin/Globulin Ratio: 1.4 (ref 1.1–2.5)
Albumin: 4.2 g/dL (ref 3.5–5.5)
Alkaline Phosphatase: 51 IU/L (ref 39–117)
BILIRUBIN TOTAL: 0.3 mg/dL (ref 0.0–1.2)
BUN/Creatinine Ratio: 13 (ref 9–23)
BUN: 15 mg/dL (ref 6–24)
CO2: 25 mmol/L (ref 18–29)
CREATININE: 1.17 mg/dL — AB (ref 0.57–1.00)
Calcium: 9.6 mg/dL (ref 8.7–10.2)
Chloride: 98 mmol/L (ref 97–108)
GFR calc non Af Amer: 58 mL/min/{1.73_m2} — ABNORMAL LOW (ref >59)
GFR, EST AFRICAN AMERICAN: 67 mL/min/{1.73_m2} (ref >59)
GLOBULIN, TOTAL: 3 g/dL (ref 1.5–4.5)
GLUCOSE: 109 mg/dL — AB (ref 65–99)
POTASSIUM: 3.3 mmol/L — AB (ref 3.5–5.2)
SODIUM: 137 mmol/L (ref 134–144)
TOTAL PROTEIN: 7.2 g/dL (ref 6.0–8.5)

## 2014-10-08 ENCOUNTER — Ambulatory Visit: Payer: Medicaid Other | Admitting: Family

## 2014-10-25 ENCOUNTER — Encounter: Payer: Self-pay | Admitting: Family

## 2014-10-25 ENCOUNTER — Ambulatory Visit (INDEPENDENT_AMBULATORY_CARE_PROVIDER_SITE_OTHER): Payer: Medicaid Other | Admitting: Family

## 2014-10-25 VITALS — BP 157/104 | HR 84 | Temp 98.4°F | Ht 62.0 in | Wt 243.0 lb

## 2014-10-25 DIAGNOSIS — J069 Acute upper respiratory infection, unspecified: Secondary | ICD-10-CM | POA: Diagnosis not present

## 2014-10-25 DIAGNOSIS — I10 Essential (primary) hypertension: Secondary | ICD-10-CM

## 2014-10-25 DIAGNOSIS — R05 Cough: Secondary | ICD-10-CM | POA: Diagnosis not present

## 2014-10-25 DIAGNOSIS — R059 Cough, unspecified: Secondary | ICD-10-CM

## 2014-10-25 DIAGNOSIS — G43909 Migraine, unspecified, not intractable, without status migrainosus: Secondary | ICD-10-CM | POA: Insufficient documentation

## 2014-10-25 MED ORDER — BENZONATATE 200 MG PO CAPS: 200.0000 mg | ORAL_CAPSULE | Freq: Three times a day (TID) | ORAL | Status: DC | PRN

## 2014-10-25 NOTE — Progress Notes (Signed)
Subjective:    Patient ID: Kelli Walls, female    DOB: 03-09-1971, 44 y.o.   MRN: [id number]  Pt presents to the office to recheck BP. Her BP is not at goal.  Hypertension This is a chronic problem. The current episode started more than 1 year ago. The problem has been waxing and waning since onset. The problem is uncontrolled. Pertinent negatives include no anxiety, headaches, palpitations, peripheral edema or shortness of breath. Agents associated with hypertension include decongestants. Risk factors for coronary artery disease include obesity, dyslipidemia and sedentary lifestyle. Past treatments include angiotensin blockers, beta blockers, calcium channel blockers, diuretics and lifestyle changes. The current treatment provides moderate improvement. There is no history of kidney disease, CAD/MI, CVA or heart failure.    *Pt states she has been taking OTC cough medication for the last several days.  Pt is also following up with ENT about facial swelling.  Review of Systems  Constitutional: Negative.   HENT: Negative.   Eyes: Negative.   Respiratory: Negative.  Negative for shortness of breath.   Cardiovascular: Negative.  Negative for palpitations.  Gastrointestinal: Negative.   Endocrine: Negative.   Genitourinary: Negative.   Musculoskeletal: Negative.   Neurological: Negative.  Negative for headaches.  Hematological: Negative.   Psychiatric/Behavioral: Negative.   All other systems reviewed and are negative.      Objective:   Physical Exam  Constitutional: She is oriented to person, place, and time. She appears well-developed and well-nourished. No distress.  HENT:  Head: Normocephalic and atraumatic.  Right Ear: External ear normal.  Left Ear: External ear normal.  Nose: Nose normal.  Mouth/Throat: Oropharynx is clear and moist.  Eyes: Pupils are equal, round, and reactive to light.  Neck: Normal range of motion. Neck supple. No thyromegaly present.  Cardiovascular:  Normal rate, regular rhythm, normal heart sounds and intact distal pulses.   No murmur heard. Pulmonary/Chest: Effort normal and breath sounds normal. No respiratory distress. She has no wheezes.  Dry nonproductive cough   Abdominal: Soft. Bowel sounds are normal. She exhibits no distension. There is no tenderness.  Musculoskeletal: Normal range of motion. She exhibits no edema or tenderness.  Neurological: She is alert and oriented to person, place, and time. She has normal reflexes. No cranial nerve deficit.  Skin: Skin is warm and dry.  Psychiatric: She has a normal mood and affect. Her behavior is normal. Judgment and thought content normal.  Vitals reviewed.   BP 157/104 mmHg  Pulse 84  Temp(Src) 98.4 F (36.9 C) (Oral)  Ht 5\' 2"  (1.575 m)  Wt 243 lb (110.224 kg)  BMI 44.43 kg/m2       Assessment & Plan:  1. Essential hypertension, benign To follow up in 2 weeks- Pt to stop all decongestant!!!  2. Cough - benzonatate (TESSALON) 200 MG capsule; Take 1 capsule (200 mg total) by mouth 3 (three) times daily as needed.  Dispense: 30 capsule; Refill: 1  3. Acute upper respiratory infection -/- Take meds as prescribed - Use a cool mist humidifier  -Use saline nose sprays frequently -Saline irrigations of the nose can be very helpful if done frequently.  * 4X daily for 1 week*  * Use of a nettie pot can be helpful with this. Follow directions with this* -Force fluids -For any cough or congestion  Use plain Mucinex- regular strength or max strength is fine   * Children- consult with Pharmacist for dosing -For fever or aces or pains- take tylenol or ibuprofen appropriate  for age and weight.  * for fevers greater than 101 orally you may alternate ibuprofen and tylenol every  3 hours. -Throat lozenges if help - benzonatate (TESSALON) 200 MG capsule; Take 1 capsule (200 mg total) by mouth 3 (three) times daily as needed.  Dispense: 30 capsule; Refill: [place], FNP

## 2014-10-25 NOTE — Patient Instructions (Signed)
Upper Respiratory Infection, Adult An upper respiratory infection (URI) is also sometimes known as the common cold. The upper respiratory tract includes the nose, sinuses, throat, trachea, and bronchi. Bronchi are the airways leading to the lungs. Most people improve within 1 week, but symptoms can last up to 2 weeks. A residual cough may last even longer.  CAUSES Many different viruses can infect the tissues lining the upper respiratory tract. The tissues become irritated and inflamed and often become very moist. Mucus production is also common. A cold is contagious. You can easily spread the virus to others by oral contact. This includes kissing, sharing a glass, coughing, or sneezing. Touching your mouth or nose and then touching a surface, which is then touched by another person, can also spread the virus. SYMPTOMS  Symptoms typically develop 1 to 3 days after you come in contact with a cold virus. Symptoms vary from person to person. They may include:  Runny nose.  Sneezing.  Nasal congestion.  Sinus irritation.  Sore throat.  Loss of voice (laryngitis).  Cough.  Fatigue.  Muscle aches.  Loss of appetite.  Headache.  Low-grade fever. DIAGNOSIS  You might diagnose your own cold based on familiar symptoms, since most people get a cold 2 to 3 times a year. Your caregiver can confirm this based on your exam. Most importantly, your caregiver can check that your symptoms are not due to another disease such as strep throat, sinusitis, pneumonia, asthma, or epiglottitis. Blood tests, throat tests, and X-rays are not necessary to diagnose a common cold, but they may sometimes be helpful in excluding other more serious diseases. Your caregiver will decide if any further tests are required. RISKS AND COMPLICATIONS  You may be at risk for a more severe case of the common cold if you smoke cigarettes, have chronic heart disease (such as heart failure) or lung disease (such as asthma), or if  you have a weakened immune system. The very young and very old are also at risk for more serious infections. Bacterial sinusitis, middle ear infections, and bacterial pneumonia can complicate the common cold. The common cold can worsen asthma and chronic obstructive pulmonary disease (COPD). Sometimes, these complications can require emergency medical care and may be life-threatening. PREVENTION  The best way to protect against getting a cold is to practice good hygiene. Avoid oral or hand contact with people with cold symptoms. Wash your hands often if contact occurs. There is no clear evidence that vitamin C, vitamin E, echinacea, or exercise reduces the chance of developing a cold. However, it is always recommended to get plenty of rest and practice good nutrition. TREATMENT  Treatment is directed at relieving symptoms. There is no cure. Antibiotics are not effective, because the infection is caused by a virus, not by bacteria. Treatment may include:  Increased fluid intake. Sports drinks offer valuable electrolytes, sugars, and fluids.  Breathing heated mist or steam (vaporizer or shower).  Eating chicken soup or other clear broths, and maintaining good nutrition.  Getting plenty of rest.  Using gargles or lozenges for comfort.  Controlling fevers with ibuprofen or acetaminophen as directed by your caregiver.  Increasing usage of your inhaler if you have asthma. Zinc gel and zinc lozenges, taken in the first 24 hours of the common cold, can shorten the duration and lessen the severity of symptoms. Pain medicines may help with fever, muscle aches, and throat pain. A variety of non-prescription medicines are available to treat congestion and runny nose. Your caregiver   can make recommendations and may suggest nasal or lung inhalers for other symptoms.  HOME CARE INSTRUCTIONS   Only take over-the-counter or prescription medicines for pain, discomfort, or fever as directed by your  caregiver.  Use a warm mist humidifier or inhale steam from a shower to increase air moisture. This may keep secretions moist and make it easier to breathe.  Drink enough water and fluids to keep your urine clear or pale yellow.  Rest as needed.  Return to work when your temperature has returned to normal or as your caregiver advises. You may need to stay home longer to avoid infecting others. You can also use a face mask and careful hand washing to prevent spread of the virus. SEEK MEDICAL CARE IF:   After the first few days, you feel you are getting worse rather than better.  You need your caregiver's advice about medicines to control symptoms.  You develop chills, worsening shortness of breath, or brown or red sputum. These may be signs of pneumonia.  You develop yellow or brown nasal discharge or pain in the face, especially when you bend forward. These may be signs of sinusitis.  You develop a fever, swollen neck glands, pain with swallowing, or white areas in the back of your throat. These may be signs of strep throat. SEEK IMMEDIATE MEDICAL CARE IF:   You have a fever.  You develop severe or persistent headache, ear pain, sinus pain, or chest pain.  You develop wheezing, a prolonged cough, cough up blood, or have a change in your usual mucus (if you have chronic lung disease).  You develop sore muscles or a stiff neck. Document Released: 01/16/2001 Document Revised: 10/15/2011 Document Reviewed: 10/28/2013 [facility] Patient Information 2015 [place], [place]. This information is not intended to replace advice given to you by your health care provider. Make sure you discuss any questions you have with your health care provider. Guaifenesin oral ER tablets What is this medicine? GUAIFENESIN (gwye FEN e sin) is an expectorant. It helps to thin mucous and make coughs more productive. This medicine is used to treat coughs caused by colds or the flu. It is not intended to treat chronic  cough caused by smoking, asthma, emphysema, or heart failure. This medicine may be used for other purposes; ask your health care provider or pharmacist if you have questions. COMMON BRAND NAME(S): Humibid, Mucinex What should I tell my health care provider before I take this medicine? They need to know if you have any of these conditions: -fever -kidney disease -an unusual or allergic reaction to guaifenesin, other medicines, foods, dyes, or preservatives -pregnant or trying to get pregnant -breast-feeding How should I use this medicine? Take this medicine by mouth with a full glass of water. Follow the directions on the prescription label. Do not break, chew or crush this medicine. You may take with food or on an empty stomach. Take your medicine at regular intervals. Do not take your medicine more often than directed. Talk to your pediatrician regarding the use of this medicine in children. While this drug may be prescribed for children as young as 55 years old for selected conditions, precautions do apply. Overdosage: If you think you have taken too much of this medicine contact a poison control center or emergency room at once. NOTE: This medicine is only for you. Do not share this medicine with others. What if I miss a dose? If you miss a dose, take it as soon as you can. If it  is almost time for your next dose, take only that dose. Do not take double or extra doses. What may interact with this medicine? Interactions are not expected. This list may not describe all possible interactions. Give your health care provider a list of all the medicines, herbs, non-prescription drugs, or dietary supplements you use. Also tell them if you smoke, drink alcohol, or use illegal drugs. Some items may interact with your medicine. What should I watch for while using this medicine? Do not treat a cough for more than 1 week without consulting your doctor or health care professional. If you also have a high  fever, skin rash, continuing headache, or sore throat, see your doctor. For best results, drink 6 to 8 glasses water daily while you are taking this medicine. What side effects may I notice from receiving this medicine? Side effects that you should report to your doctor or health care professional as soon as possible: -allergic reactions like skin rash, itching or hives, swelling of the face, lips, or tongue Side effects that usually do not require medical attention (report to your doctor or health care professional if they continue or are bothersome): -dizziness -headache -stomach upset This list may not describe all possible side effects. Call your doctor for medical advice about side effects. You may report side effects to FDA at 1-800-FDA-1088. Where should I keep my medicine? Keep out of the reach of children. Store at room temperature between 20 and 25 degrees C (68 and 77 degrees F). Keep container tightly closed. Throw away any unused medicine after the expiration date. NOTE: This sheet is a summary. It may not cover all possible information. If you have questions about this medicine, talk to your doctor, pharmacist, or health care provider.  2015, [employer] Standard. (2007-12-03 12:14:14)

## 2014-11-11 ENCOUNTER — Ambulatory Visit: Payer: Medicaid Other | Admitting: Family

## 2014-11-17 ENCOUNTER — Ambulatory Visit (INDEPENDENT_AMBULATORY_CARE_PROVIDER_SITE_OTHER): Payer: Medicaid Other | Admitting: Family

## 2014-11-17 ENCOUNTER — Encounter: Payer: Self-pay | Admitting: Family

## 2014-11-17 VITALS — BP 159/112 | HR 84 | Temp 99.2°F | Ht 62.0 in | Wt 239.6 lb

## 2014-11-17 DIAGNOSIS — I10 Essential (primary) hypertension: Secondary | ICD-10-CM

## 2014-11-17 MED ORDER — METOPROLOL SUCCINATE ER 100 MG PO TB24: 100.0000 mg | ORAL_TABLET | Freq: Every day | ORAL | Status: DC

## 2014-11-17 NOTE — Progress Notes (Signed)
   Subjective:    Patient ID: Kelli Walls, female    DOB: 12/13/1970, 44 y.o.   MRN: [id number]  Pt presents to the office today to recheck HTN. Pt's BP is not at goal.  Hypertension This is a chronic problem. The current episode started more than 1 year ago. The problem has been waxing and waning since onset. The problem is uncontrolled. Associated symptoms include headaches. Pertinent negatives include no anxiety, palpitations, peripheral edema or shortness of breath. Risk factors for coronary artery disease include dyslipidemia, obesity, sedentary lifestyle and family history. Past treatments include angiotensin blockers, diuretics, calcium channel blockers and beta blockers. There is no history of kidney disease, CAD/MI, CVA, heart failure or a thyroid problem. There is no history of sleep apnea.      Review of Systems  Constitutional: Negative.   Eyes: Negative.   Respiratory: Negative.  Negative for shortness of breath.   Cardiovascular: Negative.  Negative for palpitations.  Gastrointestinal: Negative.   Endocrine: Negative.   Genitourinary: Negative.   Musculoskeletal: Negative.   Neurological: Positive for headaches.  Hematological: Negative.   Psychiatric/Behavioral: Negative.   All other systems reviewed and are negative.      Objective:   Physical Exam  Constitutional: She is oriented to person, place, and time. She appears well-developed and well-nourished. No distress.  HENT:  Head: Normocephalic and atraumatic.  Right Ear: External ear normal.  Left Ear: External ear normal.  Nose: Nose normal.  Mouth/Throat: Oropharynx is clear and moist.  Eyes: Pupils are equal, round, and reactive to light.  Neck: Normal range of motion. Neck supple. No thyromegaly present.  Cardiovascular: Normal rate, regular rhythm, normal heart sounds and intact distal pulses.   No murmur heard. Pulmonary/Chest: Effort normal and breath sounds normal. No respiratory distress. She has no  wheezes.  Abdominal: Soft. Bowel sounds are normal. She exhibits no distension. There is no tenderness.  Musculoskeletal: Normal range of motion. She exhibits no edema or tenderness.  Neurological: She is alert and oriented to person, place, and time. She has normal reflexes. No cranial nerve deficit.  Skin: Skin is warm and dry.  Psychiatric: She has a normal mood and affect. Her behavior is normal. Judgment and thought content normal.  Vitals reviewed.     BP 159/112 mmHg  Pulse 84  Temp(Src) 99.2 F (37.3 C) (Oral)  Ht $R'5\' 2"'IY$  (1.575 m)  Wt 239 lb 9.6 oz (108.682 kg)  BMI 43.81 kg/m2     Assessment & Plan:  1. Essential hypertension, benign -Metoprolol increased to 100 mg from 50 mg -Dash diet information given -Exercise encouraged - Stress Management  -Continue current meds -RTO in 2 weeks - BMP8+EGFR - metoprolol succinate (TOPROL-XL) 100 MG 24 hr tablet; Take 1 tablet (100 mg total) by mouth daily. Take with or immediately following a meal.  Dispense: 90 tablet; Refill: [place], FNP

## 2014-11-17 NOTE — Patient Instructions (Addendum)
Hypertension Hypertension, commonly called high blood pressure, is when the force of blood pumping through your arteries is too strong. Your arteries are the blood vessels that carry blood from your heart throughout your body. A blood pressure reading consists of a higher number over a lower number, such as 110/72. The higher number (systolic) is the pressure inside your arteries when your heart pumps. The lower number (diastolic) is the pressure inside your arteries when your heart relaxes. Ideally you want your blood pressure below 120/80. Hypertension forces your heart to work harder to pump blood. Your arteries may become narrow or stiff. Having hypertension puts you at risk for heart disease, stroke, and other problems.  RISK FACTORS Some risk factors for high blood pressure are controllable. Others are not.  Risk factors you cannot control include:   Race. You may be at higher risk if you are African American.  Age. Risk increases with age.  Gender. Men are at higher risk than women before age 45 years. After age 65, women are at higher risk than men. Risk factors you can control include:  Not getting enough exercise or physical activity.  Being overweight.  Getting too much fat, sugar, calories, or salt in your diet.  Drinking too much alcohol. SIGNS AND SYMPTOMS Hypertension does not usually cause signs or symptoms. Extremely high blood pressure (hypertensive crisis) may cause headache, anxiety, shortness of breath, and nosebleed. DIAGNOSIS  To check if you have hypertension, your health care provider will measure your blood pressure while you are seated, with your arm held at the level of your heart. It should be measured at least twice using the same arm. Certain conditions can cause a difference in blood pressure between your right and left arms. A blood pressure reading that is higher than normal on one occasion does not mean that you need treatment. If one blood pressure reading  is high, ask your health care provider about having it checked again. TREATMENT  Treating high blood pressure includes making lifestyle changes and possibly taking medicine. Living a healthy lifestyle can help lower high blood pressure. You may need to change some of your habits. Lifestyle changes may include:  Following the DASH diet. This diet is high in fruits, vegetables, and whole grains. It is low in salt, red meat, and added sugars.  Getting at least 2 hours of brisk physical activity every week.  Losing weight if necessary.  Not smoking.  Limiting alcoholic beverages.  Learning ways to reduce stress. If lifestyle changes are not enough to get your blood pressure under control, your health care provider may prescribe medicine. You may need to take more than one. Work closely with your health care provider to understand the risks and benefits. HOME CARE INSTRUCTIONS  Have your blood pressure rechecked as directed by your health care provider.   Take medicines only as directed by your health care provider. Follow the directions carefully. Blood pressure medicines must be taken as prescribed. The medicine does not work as well when you skip doses. Skipping doses also puts you at risk for problems.   Do not smoke.   Monitor your blood pressure at home as directed by your health care provider. SEEK MEDICAL CARE IF:   You think you are having a reaction to medicines taken.  You have recurrent headaches or feel dizzy.  You have swelling in your ankles.  You have trouble with your vision. SEEK IMMEDIATE MEDICAL CARE IF:  You develop a severe headache or confusion.    You have unusual weakness, numbness, or feel faint.  You have severe chest or abdominal pain.  You vomit repeatedly.  You have trouble breathing. MAKE SURE YOU:   Understand these instructions.  Will watch your condition.  Will get help right away if you are not doing well or get worse. Document  Released: 07/23/2005 Document Revised: 12/07/2013 Document Reviewed: 05/15/2013 [employer] Patient Information 2015 [employer]. This information is not intended to replace advice given to you by your health care provider. Make sure you discuss any questions you have with your health care provider. DASH Eating Plan DASH stands for "Dietary Approaches to Stop Hypertension." The DASH eating plan is a healthy eating plan that has been shown to reduce high blood pressure (hypertension). Additional health benefits may include reducing the risk of type 2 diabetes mellitus, heart disease, and stroke. The DASH eating plan may also help with weight loss. WHAT DO I NEED TO KNOW ABOUT THE DASH EATING PLAN? For the DASH eating plan, you will follow these general guidelines:  Choose foods with a percent daily value for sodium of less than 5% (as listed on the food label).  Use salt-free seasonings or herbs instead of table salt or sea salt.  Check with your health care provider or pharmacist before using salt substitutes.  Eat lower-sodium products, often labeled as "lower sodium" or "no salt added."  Eat fresh foods.  Eat more vegetables, fruits, and low-fat dairy products.  Choose whole grains. Look for the word "whole" as the first word in the ingredient list.  Choose fish and skinless chicken or turkey more often than red meat. Limit fish, poultry, and meat to 6 oz (170 g) each day.  Limit sweets, desserts, sugars, and sugary drinks.  Choose heart-healthy fats.  Limit cheese to 1 oz (28 g) per day.  Eat more home-cooked food and less restaurant, buffet, and fast food.  Limit fried foods.  Cook foods using methods other than frying.  Limit canned vegetables. If you do use them, rinse them well to decrease the sodium.  When eating at a restaurant, ask that your food be prepared with less salt, or no salt if possible. WHAT FOODS CAN I EAT? Seek help from a dietitian for individual  calorie needs. Grains Whole grain or whole wheat bread. Brown rice. Whole grain or whole wheat pasta. Quinoa, bulgur, and whole grain cereals. Low-sodium cereals. Corn or whole wheat flour tortillas. Whole grain cornbread. Whole grain crackers. Low-sodium crackers. Vegetables Fresh or frozen vegetables (raw, steamed, roasted, or grilled). Low-sodium or reduced-sodium tomato and vegetable juices. Low-sodium or reduced-sodium tomato sauce and paste. Low-sodium or reduced-sodium canned vegetables.  Fruits All fresh, canned (in natural juice), or frozen fruits. Meat and Other Protein Products Ground beef (85% or leaner), grass-fed beef, or beef trimmed of fat. Skinless chicken or turkey. Ground chicken or turkey. Pork trimmed of fat. All fish and seafood. Eggs. Dried beans, peas, or lentils. Unsalted nuts and seeds. Unsalted canned beans. Dairy Low-fat dairy products, such as skim or 1% milk, 2% or reduced-fat cheeses, low-fat ricotta or cottage cheese, or plain low-fat yogurt. Low-sodium or reduced-sodium cheeses. Fats and Oils Tub margarines without trans fats. Light or reduced-fat mayonnaise and salad dressings (reduced sodium). Avocado. Safflower, olive, or canola oils. Natural peanut or almond butter. Other Unsalted popcorn and pretzels. The items listed above may not be a complete list of recommended foods or beverages. Contact your dietitian for more options. WHAT FOODS ARE NOT RECOMMENDED? Grains White bread.   White pasta. White rice. Refined cornbread. Bagels and croissants. Crackers that contain trans fat. Vegetables Creamed or fried vegetables. Vegetables in a cheese sauce. Regular canned vegetables. Regular canned tomato sauce and paste. Regular tomato and vegetable juices. Fruits Dried fruits. Canned fruit in light or heavy syrup. Fruit juice. Meat and Other Protein Products Fatty cuts of meat. Ribs, chicken wings, bacon, sausage, bologna, salami, chitterlings, fatback, hot dogs,  bratwurst, and packaged luncheon meats. Salted nuts and seeds. Canned beans with salt. Dairy Whole or 2% milk, cream, half-and-half, and cream cheese. Whole-fat or sweetened yogurt. Full-fat cheeses or blue cheese. Nondairy creamers and whipped toppings. Processed cheese, cheese spreads, or cheese curds. Condiments Onion and garlic salt, seasoned salt, table salt, and sea salt. Canned and packaged gravies. Worcestershire sauce. Tartar sauce. Barbecue sauce. Teriyaki sauce. Soy sauce, including reduced sodium. Steak sauce. Fish sauce. Oyster sauce. Cocktail sauce. Horseradish. Ketchup and mustard. Meat flavorings and tenderizers. Bouillon cubes. Hot sauce. Tabasco sauce. Marinades. Taco seasonings. Relishes. Fats and Oils Butter, stick margarine, lard, shortening, ghee, and bacon fat. Coconut, palm kernel, or palm oils. Regular salad dressings. Other Pickles and olives. Salted popcorn and pretzels. The items listed above may not be a complete list of foods and beverages to avoid. Contact your dietitian for more information. WHERE CAN I FIND MORE INFORMATION? National Heart, Lung, and Blood Institute: www.nhlbi.nih.gov/health/health-topics/topics/dash/ Document Released: 07/12/2011 Document Revised: 12/07/2013 Document Reviewed: 05/27/2013 [employer] Patient Information 2015 [employer]. This information is not intended to replace advice given to you by your health care provider. Make sure you discuss any questions you have with your health care provider.  

## 2014-11-18 LAB — BMP8+EGFR
BUN/Creatinine Ratio: 11 (ref 9–23)
BUN: 11 mg/dL (ref 6–24)
CO2: 26 mmol/L (ref 18–29)
Calcium: 9.1 mg/dL (ref 8.7–10.2)
Chloride: 100 mmol/L (ref 97–108)
Creatinine, Ser: 1.02 mg/dL — ABNORMAL HIGH (ref 0.57–1.00)
GFR calc Af Amer: 79 mL/min/{1.73_m2} (ref >59)
GFR calc non Af Amer: 68 mL/min/{1.73_m2} (ref >59)
Glucose: 93 mg/dL (ref 65–99)
Potassium: 3.9 mmol/L (ref 3.5–5.2)
Sodium: 140 mmol/L (ref 134–144)

## 2014-11-30 ENCOUNTER — Telehealth: Payer: Self-pay | Admitting: Family

## 2014-12-06 ENCOUNTER — Ambulatory Visit (INDEPENDENT_AMBULATORY_CARE_PROVIDER_SITE_OTHER): Payer: Medicaid Other | Admitting: Family

## 2014-12-06 ENCOUNTER — Encounter: Payer: Self-pay | Admitting: Family

## 2014-12-06 VITALS — BP 134/82 | HR 89 | Temp 97.0°F | Ht 62.0 in | Wt 236.0 lb

## 2014-12-06 DIAGNOSIS — I10 Essential (primary) hypertension: Secondary | ICD-10-CM

## 2014-12-06 NOTE — Addendum Note (Signed)
Addended by: Earlene Plater on: 12/06/2014 04:16 PM   Modules accepted: Miquel Dunn

## 2014-12-06 NOTE — Progress Notes (Signed)
   Subjective:    Patient ID: Kelli Walls, female    DOB: Jun 02, 1971, 44 y.o.   MRN: [id number]  Pt presents to the office today to recheck HTN. Pt's BP is at goal today!!  Hypertension This is a chronic problem. The current episode started more than 1 year ago. The problem has been resolved since onset. The problem is controlled. Pertinent negatives include no anxiety, headaches, palpitations, peripheral edema or shortness of breath. Risk factors for coronary artery disease include family history, obesity, sedentary lifestyle and dyslipidemia. Past treatments include calcium channel blockers, alpha 1 blockers, diuretics and beta blockers. The current treatment provides significant improvement. There is no history of kidney disease, CAD/MI, CVA, heart failure or a thyroid problem. There is no history of sleep apnea.      Review of Systems  Constitutional: Negative.   HENT: Negative.   Eyes: Negative.   Respiratory: Negative.  Negative for shortness of breath.   Cardiovascular: Negative.  Negative for palpitations.  Gastrointestinal: Negative.   Endocrine: Negative.   Genitourinary: Negative.   Musculoskeletal: Negative.   Neurological: Negative.  Negative for headaches.  Hematological: Negative.   Psychiatric/Behavioral: Negative.   All other systems reviewed and are negative.      Objective:   Physical Exam  Constitutional: She is oriented to person, place, and time. She appears well-developed and well-nourished. No distress.  HENT:  Head: Normocephalic and atraumatic.  Eyes: Pupils are equal, round, and reactive to light.  Neck: Normal range of motion. Neck supple. No thyromegaly present.  Cardiovascular: Normal rate, regular rhythm, normal heart sounds and intact distal pulses.   No murmur heard. Pulmonary/Chest: Effort normal and breath sounds normal. No respiratory distress. She has no wheezes.  Abdominal: Soft. Bowel sounds are normal. She exhibits no distension. There is  no tenderness.  Musculoskeletal: Normal range of motion. She exhibits no edema or tenderness.  Neurological: She is alert and oriented to person, place, and time. She has normal reflexes. No cranial nerve deficit.  Skin: Skin is warm and dry.  Psychiatric: She has a normal mood and affect. Her behavior is normal. Judgment and thought content normal.  Vitals reviewed.     BP 134/82 mmHg  Pulse 89  Temp(Src) 97 F (36.1 C) (Oral)  Ht $R'5\' 2"'qL$  (1.575 m)  Wt 236 lb (107.049 kg)  BMI 43.15 kg/m2     Assessment & Plan:  1. Essential hypertension, benign -Dash diet information given -Exercise encouraged - Stress Management  -Continue current meds -RTO in 6 months - BMP8+EGFR   Evelina Dun, FNP

## 2014-12-06 NOTE — Patient Instructions (Signed)

## 2014-12-07 LAB — BMP8+EGFR
BUN / CREAT RATIO: 11 (ref 9–23)
BUN: 17 mg/dL (ref 6–24)
CHLORIDE: 103 mmol/L (ref 97–108)
CO2: 23 mmol/L (ref 18–29)
Calcium: 9.6 mg/dL (ref 8.7–10.2)
Creatinine, Ser: 1.49 mg/dL — ABNORMAL HIGH (ref 0.57–1.00)
GFR calc Af Amer: 50 mL/min/{1.73_m2} — ABNORMAL LOW (ref >59)
GFR calc non Af Amer: 43 mL/min/{1.73_m2} — ABNORMAL LOW (ref >59)
Glucose: 107 mg/dL — ABNORMAL HIGH (ref 65–99)
Potassium: 3.9 mmol/L (ref 3.5–5.2)
SODIUM: 142 mmol/L (ref 134–144)

## 2014-12-08 ENCOUNTER — Other Ambulatory Visit: Payer: Self-pay | Admitting: Family

## 2014-12-08 DIAGNOSIS — R7989 Other specified abnormal findings of blood chemistry: Secondary | ICD-10-CM

## 2015-01-25 ENCOUNTER — Ambulatory Visit (INDEPENDENT_AMBULATORY_CARE_PROVIDER_SITE_OTHER): Payer: Medicaid Other | Admitting: Family

## 2015-01-25 ENCOUNTER — Encounter (INDEPENDENT_AMBULATORY_CARE_PROVIDER_SITE_OTHER): Payer: Self-pay

## 2015-01-25 ENCOUNTER — Encounter: Payer: Self-pay | Admitting: Family

## 2015-01-25 VITALS — BP 177/126 | HR 67 | Temp 98.5°F | Ht 62.0 in | Wt 237.8 lb

## 2015-01-25 DIAGNOSIS — I10 Essential (primary) hypertension: Secondary | ICD-10-CM | POA: Diagnosis not present

## 2015-01-25 MED ORDER — AMLODIPINE BESYLATE 10 MG PO TABS: 10.0000 mg | ORAL_TABLET | Freq: Every day | ORAL | Status: DC

## 2015-01-25 MED ORDER — LOSARTAN POTASSIUM-HCTZ 100-25 MG PO TABS: 1.0000 | ORAL_TABLET | Freq: Every day | ORAL | Status: DC

## 2015-01-25 MED ORDER — METOPROLOL SUCCINATE ER 100 MG PO TB24: 100.0000 mg | ORAL_TABLET | Freq: Every day | ORAL | Status: DC

## 2015-01-25 NOTE — Patient Instructions (Signed)
DASH Eating Plan DASH stands for "Dietary Approaches to Stop Hypertension." The DASH eating plan is a healthy eating plan that has been shown to reduce high blood pressure (hypertension). Additional health benefits may include reducing the risk of type 2 diabetes mellitus, heart disease, and stroke. The DASH eating plan may also help with weight loss. WHAT DO I NEED TO KNOW ABOUT THE DASH EATING PLAN? For the DASH eating plan, you will follow these general guidelines:  Choose foods with a percent daily value for sodium of less than 5% (as listed on the food label).  Use salt-free seasonings or herbs instead of table salt or sea salt.  Check with your health care provider or pharmacist before using salt substitutes.  Eat lower-sodium products, often labeled as "lower sodium" or "no salt added."  Eat fresh foods.  Eat more vegetables, fruits, and low-fat dairy products.  Choose whole grains. Look for the word "whole" as the first word in the ingredient list.  Choose fish and skinless chicken or turkey more often than red meat. Limit fish, poultry, and meat to 6 oz (170 g) each day.  Limit sweets, desserts, sugars, and sugary drinks.  Choose heart-healthy fats.  Limit cheese to 1 oz (28 g) per day.  Eat more home-cooked food and less restaurant, buffet, and fast food.  Limit fried foods.  Cook foods using methods other than frying.  Limit canned vegetables. If you do use them, rinse them well to decrease the sodium.  When eating at a restaurant, ask that your food be prepared with less salt, or no salt if possible. WHAT FOODS CAN I EAT? Seek help from a dietitian for individual calorie needs. Grains Whole grain or whole wheat bread. Brown rice. Whole grain or whole wheat pasta. Quinoa, bulgur, and whole grain cereals. Low-sodium cereals. Corn or whole wheat flour tortillas. Whole grain cornbread. Whole grain crackers. Low-sodium crackers. Vegetables Fresh or frozen vegetables  (raw, steamed, roasted, or grilled). Low-sodium or reduced-sodium tomato and vegetable juices. Low-sodium or reduced-sodium tomato sauce and paste. Low-sodium or reduced-sodium canned vegetables.  Fruits All fresh, canned (in natural juice), or frozen fruits. Meat and Other Protein Products Ground beef (85% or leaner), grass-fed beef, or beef trimmed of fat. Skinless chicken or turkey. Ground chicken or turkey. Pork trimmed of fat. All fish and seafood. Eggs. Dried beans, peas, or lentils. Unsalted nuts and seeds. Unsalted canned beans. Dairy Low-fat dairy products, such as skim or 1% milk, 2% or reduced-fat cheeses, low-fat ricotta or cottage cheese, or plain low-fat yogurt. Low-sodium or reduced-sodium cheeses. Fats and Oils Tub margarines without trans fats. Light or reduced-fat mayonnaise and salad dressings (reduced sodium). Avocado. Safflower, olive, or canola oils. Natural peanut or almond butter. Other Unsalted popcorn and pretzels. The items listed above may not be a complete list of recommended foods or beverages. Contact your dietitian for more options. WHAT FOODS ARE NOT RECOMMENDED? Grains White bread. White pasta. White rice. Refined cornbread. Bagels and croissants. Crackers that contain trans fat. Vegetables Creamed or fried vegetables. Vegetables in a cheese sauce. Regular canned vegetables. Regular canned tomato sauce and paste. Regular tomato and vegetable juices. Fruits Dried fruits. Canned fruit in light or heavy syrup. Fruit juice. Meat and Other Protein Products Fatty cuts of meat. Ribs, chicken wings, bacon, sausage, bologna, salami, chitterlings, fatback, hot dogs, bratwurst, and packaged luncheon meats. Salted nuts and seeds. Canned beans with salt. Dairy Whole or 2% milk, cream, half-and-half, and cream cheese. Whole-fat or sweetened yogurt. Full-fat   cheeses or blue cheese. Nondairy creamers and whipped toppings. Processed cheese, cheese spreads, or cheese  curds. Condiments Onion and garlic salt, seasoned salt, table salt, and sea salt. Canned and packaged gravies. Worcestershire sauce. Tartar sauce. Barbecue sauce. Teriyaki sauce. Soy sauce, including reduced sodium. Steak sauce. Fish sauce. Oyster sauce. Cocktail sauce. Horseradish. Ketchup and mustard. Meat flavorings and tenderizers. Bouillon cubes. Hot sauce. Tabasco sauce. Marinades. Taco seasonings. Relishes. Fats and Oils Butter, stick margarine, lard, shortening, ghee, and bacon fat. Coconut, palm kernel, or palm oils. Regular salad dressings. Other Pickles and olives. Salted popcorn and pretzels. The items listed above may not be a complete list of foods and beverages to avoid. Contact your dietitian for more information. WHERE CAN I FIND MORE INFORMATION? National Heart, [employer], [employer] Institute: www.nhlbi.nih.gov/health/health-topics/topics/dash/ Document Released: 07/12/2011 Document Revised: 12/07/2013 Document Reviewed: 05/27/2013 ExitCare Patient Information 2015 [employer]. This information is not intended to replace advice given to you by your health care provider. Make sure you discuss any questions you have with your health care provider. Hypertension Hypertension, commonly called high blood pressure, is when the force of blood pumping through your arteries is too strong. Your arteries are the blood vessels that carry blood from your heart throughout your body. A blood pressure reading consists of a higher number over a lower number, such as 110/72. The higher number (systolic) is the pressure inside your arteries when your heart pumps. The lower number (diastolic) is the pressure inside your arteries when your heart relaxes. Ideally you want your blood pressure below 120/80. Hypertension forces your heart to work harder to pump blood. Your arteries may become narrow or stiff. Having hypertension puts you at risk for heart disease, stroke, and other problems.  RISK  FACTORS Some risk factors for high blood pressure are controllable. Others are not.  Risk factors you cannot control include:   Race. You may be at higher risk if you are African American.  Age. Risk increases with age.  Gender. Men are at higher risk than women before age 45 years. After age 65, women are at higher risk than men. Risk factors you can control include:  Not getting enough exercise or physical activity.  Being overweight.  Getting too much fat, sugar, calories, or salt in your diet.  Drinking too much alcohol. SIGNS AND SYMPTOMS Hypertension does not usually cause signs or symptoms. Extremely high blood pressure (hypertensive crisis) may cause headache, anxiety, shortness of breath, and nosebleed. DIAGNOSIS  To check if you have hypertension, your health care provider will measure your blood pressure while you are seated, with your arm held at the level of your heart. It should be measured at least twice using the same arm. Certain conditions can cause a difference in blood pressure between your right and left arms. A blood pressure reading that is higher than normal on one occasion does not mean that you need treatment. If one blood pressure reading is high, ask your health care provider about having it checked again. TREATMENT  Treating high blood pressure includes making lifestyle changes and possibly taking medicine. Living a healthy lifestyle can help lower high blood pressure. You may need to change some of your habits. Lifestyle changes may include:  Following the DASH diet. This diet is high in fruits, vegetables, and whole grains. It is low in salt, red meat, and added sugars.  Getting at least 2 hours of brisk physical activity every week.  Losing weight if necessary.  Not smoking.  Limiting   alcoholic beverages.  Learning ways to reduce stress. If lifestyle changes are not enough to get your blood pressure under control, your health care provider may  prescribe medicine. You may need to take more than one. Work closely with your health care provider to understand the risks and benefits. HOME CARE INSTRUCTIONS  Have your blood pressure rechecked as directed by your health care provider.   Take medicines only as directed by your health care provider. Follow the directions carefully. Blood pressure medicines must be taken as prescribed. The medicine does not work as well when you skip doses. Skipping doses also puts you at risk for problems.   Do not smoke.   Monitor your blood pressure at home as directed by your health care provider. SEEK MEDICAL CARE IF:   You think you are having a reaction to medicines taken.  You have recurrent headaches or feel dizzy.  You have swelling in your ankles.  You have trouble with your vision. SEEK IMMEDIATE MEDICAL CARE IF:  You develop a severe headache or confusion.  You have unusual weakness, numbness, or feel faint.  You have severe chest or abdominal pain.  You vomit repeatedly.  You have trouble breathing. MAKE SURE YOU:   Understand these instructions.  Will watch your condition.  Will get help right away if you are not doing well or get worse. Document Released: 07/23/2005 Document Revised: 12/07/2013 Document Reviewed: 05/15/2013 ExitCare Patient Information 2015 [employer]. This information is not intended to replace advice given to you by your health care provider. Make sure you discuss any questions you have with your health care provider.  

## 2015-01-25 NOTE — Progress Notes (Signed)
   Subjective:    Patient ID: Kelli Walls, female    DOB: 06-Oct-1970, 44 y.o.   MRN: [id number]  Pt presents to the office to recheck HTN and creatinine. Pt states she has not taken any of her blood pressure medications for "at least a week". Pt states she "just forgot".  Hypertension The current episode started more than 1 year ago. The problem has been waxing and waning since onset. The problem is uncontrolled. Associated symptoms include headaches. Pertinent negatives include no anxiety, palpitations, peripheral edema or shortness of breath. Risk factors for coronary artery disease include dyslipidemia, obesity, sedentary lifestyle and family history. Past treatments include nothing. The current treatment provides no improvement. There is no history of kidney disease, CAD/MI, CVA, heart failure or a thyroid problem. There is no history of sleep apnea.      Review of Systems  Constitutional: Negative.   Eyes: Negative.   Respiratory: Negative.  Negative for shortness of breath.   Cardiovascular: Negative.  Negative for palpitations.  Gastrointestinal: Negative.   Endocrine: Negative.   Genitourinary: Negative.   Musculoskeletal: Negative.   Neurological: Positive for headaches.  Hematological: Negative.   Psychiatric/Behavioral: Negative.   All other systems reviewed and are negative.      Objective:   Physical Exam  Constitutional: She is oriented to person, place, and time. She appears well-developed and well-nourished. No distress.  HENT:  Head: Normocephalic and atraumatic.  Eyes: Pupils are equal, round, and reactive to light.  Neck: Normal range of motion. Neck supple. No thyromegaly present.  Cardiovascular: Normal rate, regular rhythm, normal heart sounds and intact distal pulses.   No murmur heard. Pulmonary/Chest: Effort normal and breath sounds normal. No respiratory distress. She has no wheezes.  Abdominal: Soft. Bowel sounds are normal. She exhibits no distension.  There is no tenderness.  Musculoskeletal: Normal range of motion. She exhibits no edema or tenderness.  Neurological: She is alert and oriented to person, place, and time. She has normal reflexes. No cranial nerve deficit.  Skin: Skin is warm and dry.  Psychiatric: She has a normal mood and affect. Her behavior is normal. Judgment and thought content normal.  Vitals reviewed.   BP 177/126 mmHg  Pulse 67  Temp(Src) 98.5 F (36.9 C) (Oral)  Ht $R'5\' 2"'AK$  (1.575 m)  Wt 237 lb 12.8 oz (107.865 kg)  BMI 43.48 kg/m2       Assessment & Plan:  1. Essential hypertension, benign -All of pt's BP medications reordered- Pt educated on importance of taking medications daily -Dash diet information given -Exercise encouraged - Stress Management  -Continue current meds -RTO in 1 week  - amLODipine (NORVASC) 10 MG tablet; Take 1 tablet (10 mg total) by mouth daily.  Dispense: 90 tablet; Refill: 3 - losartan-hydrochlorothiazide (HYZAAR) 100-25 MG per tablet; Take 1 tablet by mouth daily.  Dispense: 30 tablet; Refill: 11 - metoprolol succinate (TOPROL-XL) 100 MG 24 hr tablet; Take 1 tablet (100 mg total) by mouth daily. Take with or immediately following a meal.  Dispense: 90 tablet; Refill: 3 - BMP8+EGFR  Evelina Dun, FNP

## 2015-01-25 NOTE — Addendum Note (Signed)
Addended by: Earlene Plater on: 01/25/2015 02:36 PM   Modules accepted: Miquel Dunn

## 2015-01-26 LAB — BMP8+EGFR
BUN/Creatinine Ratio: 12 (ref 9–23)
BUN: 14 mg/dL (ref 6–24)
CO2: 25 mmol/L (ref 18–29)
CREATININE: 1.18 mg/dL — AB (ref 0.57–1.00)
Calcium: 9.9 mg/dL (ref 8.7–10.2)
Chloride: 98 mmol/L (ref 97–108)
GFR calc Af Amer: 66 mL/min/{1.73_m2} (ref >59)
GFR, EST NON AFRICAN AMERICAN: 57 mL/min/{1.73_m2} — AB (ref >59)
Glucose: 81 mg/dL (ref 65–99)
Potassium: 4.4 mmol/L (ref 3.5–5.2)
Sodium: 138 mmol/L (ref 134–144)

## 2015-02-02 ENCOUNTER — Ambulatory Visit: Payer: Medicaid Other | Admitting: Family

## 2015-02-03 ENCOUNTER — Ambulatory Visit (INDEPENDENT_AMBULATORY_CARE_PROVIDER_SITE_OTHER): Payer: Medicaid Other | Admitting: Family

## 2015-02-03 ENCOUNTER — Encounter: Payer: Self-pay | Admitting: Family

## 2015-02-03 VITALS — BP 122/87 | HR 99 | Temp 100.0°F | Ht 62.0 in | Wt 236.6 lb

## 2015-02-03 DIAGNOSIS — I10 Essential (primary) hypertension: Secondary | ICD-10-CM | POA: Diagnosis not present

## 2015-02-03 NOTE — Addendum Note (Signed)
Addended by: Earlene Plater on: 02/03/2015 12:21 PM   Modules accepted: Miquel Dunn

## 2015-02-03 NOTE — Patient Instructions (Signed)
DASH Eating Plan DASH stands for "Dietary Approaches to Stop Hypertension." The DASH eating plan is a healthy eating plan that has been shown to reduce high blood pressure (hypertension). Additional health benefits may include reducing the risk of type 2 diabetes mellitus, heart disease, and stroke. The DASH eating plan may also help with weight loss. WHAT DO I NEED TO KNOW ABOUT THE DASH EATING PLAN? For the DASH eating plan, you will follow these general guidelines:  Choose foods with a percent daily value for sodium of less than 5% (as listed on the food label).  Use salt-free seasonings or herbs instead of table salt or sea salt.  Check with your health care provider or pharmacist before using salt substitutes.  Eat lower-sodium products, often labeled as "lower sodium" or "no salt added."  Eat fresh foods.  Eat more vegetables, fruits, and low-fat dairy products.  Choose whole grains. Look for the word "whole" as the first word in the ingredient list.  Choose fish and skinless chicken or turkey more often than red meat. Limit fish, poultry, and meat to 6 oz (170 g) each day.  Limit sweets, desserts, sugars, and sugary drinks.  Choose heart-healthy fats.  Limit cheese to 1 oz (28 g) per day.  Eat more home-cooked food and less restaurant, buffet, and fast food.  Limit fried foods.  Cook foods using methods other than frying.  Limit canned vegetables. If you do use them, rinse them well to decrease the sodium.  When eating at a restaurant, ask that your food be prepared with less salt, or no salt if possible. WHAT FOODS CAN I EAT? Seek help from a dietitian for individual calorie needs. Grains Whole grain or whole wheat bread. Brown rice. Whole grain or whole wheat pasta. Quinoa, bulgur, and whole grain cereals. Low-sodium cereals. Corn or whole wheat flour tortillas. Whole grain cornbread. Whole grain crackers. Low-sodium crackers. Vegetables Fresh or frozen vegetables  (raw, steamed, roasted, or grilled). Low-sodium or reduced-sodium tomato and vegetable juices. Low-sodium or reduced-sodium tomato sauce and paste. Low-sodium or reduced-sodium canned vegetables.  Fruits All fresh, canned (in natural juice), or frozen fruits. Meat and Other Protein Products Ground beef (85% or leaner), grass-fed beef, or beef trimmed of fat. Skinless chicken or turkey. Ground chicken or turkey. Pork trimmed of fat. All fish and seafood. Eggs. Dried beans, peas, or lentils. Unsalted nuts and seeds. Unsalted canned beans. Dairy Low-fat dairy products, such as skim or 1% milk, 2% or reduced-fat cheeses, low-fat ricotta or cottage cheese, or plain low-fat yogurt. Low-sodium or reduced-sodium cheeses. Fats and Oils Tub margarines without trans fats. Light or reduced-fat mayonnaise and salad dressings (reduced sodium). Avocado. Safflower, olive, or canola oils. Natural peanut or almond butter. Other Unsalted popcorn and pretzels. The items listed above may not be a complete list of recommended foods or beverages. Contact your dietitian for more options. WHAT FOODS ARE NOT RECOMMENDED? Grains White bread. White pasta. White rice. Refined cornbread. Bagels and croissants. Crackers that contain trans fat. Vegetables Creamed or fried vegetables. Vegetables in a cheese sauce. Regular canned vegetables. Regular canned tomato sauce and paste. Regular tomato and vegetable juices. Fruits Dried fruits. Canned fruit in light or heavy syrup. Fruit juice. Meat and Other Protein Products Fatty cuts of meat. Ribs, chicken wings, bacon, sausage, bologna, salami, chitterlings, fatback, hot dogs, bratwurst, and packaged luncheon meats. Salted nuts and seeds. Canned beans with salt. Dairy Whole or 2% milk, cream, half-and-half, and cream cheese. Whole-fat or sweetened yogurt. Full-fat   cheeses or blue cheese. Nondairy creamers and whipped toppings. Processed cheese, cheese spreads, or cheese  curds. Condiments Onion and garlic salt, seasoned salt, table salt, and sea salt. Canned and packaged gravies. Worcestershire sauce. Tartar sauce. Barbecue sauce. Teriyaki sauce. Soy sauce, including reduced sodium. Steak sauce. Fish sauce. Oyster sauce. Cocktail sauce. Horseradish. Ketchup and mustard. Meat flavorings and tenderizers. Bouillon cubes. Hot sauce. Tabasco sauce. Marinades. Taco seasonings. Relishes. Fats and Oils Butter, stick margarine, lard, shortening, ghee, and bacon fat. Coconut, palm kernel, or palm oils. Regular salad dressings. Other Pickles and olives. Salted popcorn and pretzels. The items listed above may not be a complete list of foods and beverages to avoid. Contact your dietitian for more information. WHERE CAN I FIND MORE INFORMATION? National Heart, [employer], [employer] Institute: www.nhlbi.nih.gov/health/health-topics/topics/dash/ Document Released: 07/12/2011 Document Revised: 12/07/2013 Document Reviewed: 05/27/2013 ExitCare Patient Information 2015 [employer]. This information is not intended to replace advice given to you by your health care provider. Make sure you discuss any questions you have with your health care provider. Hypertension Hypertension, commonly called high blood pressure, is when the force of blood pumping through your arteries is too strong. Your arteries are the blood vessels that carry blood from your heart throughout your body. A blood pressure reading consists of a higher number over a lower number, such as 110/72. The higher number (systolic) is the pressure inside your arteries when your heart pumps. The lower number (diastolic) is the pressure inside your arteries when your heart relaxes. Ideally you want your blood pressure below 120/80. Hypertension forces your heart to work harder to pump blood. Your arteries may become narrow or stiff. Having hypertension puts you at risk for heart disease, stroke, and other problems.  RISK  FACTORS Some risk factors for high blood pressure are controllable. Others are not.  Risk factors you cannot control include:   Race. You may be at higher risk if you are African American.  Age. Risk increases with age.  Gender. Men are at higher risk than women before age 45 years. After age 65, women are at higher risk than men. Risk factors you can control include:  Not getting enough exercise or physical activity.  Being overweight.  Getting too much fat, sugar, calories, or salt in your diet.  Drinking too much alcohol. SIGNS AND SYMPTOMS Hypertension does not usually cause signs or symptoms. Extremely high blood pressure (hypertensive crisis) may cause headache, anxiety, shortness of breath, and nosebleed. DIAGNOSIS  To check if you have hypertension, your health care provider will measure your blood pressure while you are seated, with your arm held at the level of your heart. It should be measured at least twice using the same arm. Certain conditions can cause a difference in blood pressure between your right and left arms. A blood pressure reading that is higher than normal on one occasion does not mean that you need treatment. If one blood pressure reading is high, ask your health care provider about having it checked again. TREATMENT  Treating high blood pressure includes making lifestyle changes and possibly taking medicine. Living a healthy lifestyle can help lower high blood pressure. You may need to change some of your habits. Lifestyle changes may include:  Following the DASH diet. This diet is high in fruits, vegetables, and whole grains. It is low in salt, red meat, and added sugars.  Getting at least 2 hours of brisk physical activity every week.  Losing weight if necessary.  Not smoking.  Limiting   alcoholic beverages.  Learning ways to reduce stress. If lifestyle changes are not enough to get your blood pressure under control, your health care provider may  prescribe medicine. You may need to take more than one. Work closely with your health care provider to understand the risks and benefits. HOME CARE INSTRUCTIONS  Have your blood pressure rechecked as directed by your health care provider.   Take medicines only as directed by your health care provider. Follow the directions carefully. Blood pressure medicines must be taken as prescribed. The medicine does not work as well when you skip doses. Skipping doses also puts you at risk for problems.   Do not smoke.   Monitor your blood pressure at home as directed by your health care provider. SEEK MEDICAL CARE IF:   You think you are having a reaction to medicines taken.  You have recurrent headaches or feel dizzy.  You have swelling in your ankles.  You have trouble with your vision. SEEK IMMEDIATE MEDICAL CARE IF:  You develop a severe headache or confusion.  You have unusual weakness, numbness, or feel faint.  You have severe chest or abdominal pain.  You vomit repeatedly.  You have trouble breathing. MAKE SURE YOU:   Understand these instructions.  Will watch your condition.  Will get help right away if you are not doing well or get worse. Document Released: 07/23/2005 Document Revised: 12/07/2013 Document Reviewed: 05/15/2013 ExitCare Patient Information 2015 [employer]. This information is not intended to replace advice given to you by your health care provider. Make sure you discuss any questions you have with your health care provider.  

## 2015-02-03 NOTE — Progress Notes (Signed)
   Subjective:    Patient ID: Kelli Walls, female    DOB: 11-Jun-1971, 44 y.o.   MRN: [id number]  Pt presents to the office today to recheck HTN. Pt's BP is at goal today. Pt reports taking medications as prescribed.  Hypertension This is a chronic problem. The current episode started more than 1 year ago. The problem has been resolved since onset. The problem is controlled. Associated symptoms include palpitations. Pertinent negatives include no anxiety, headaches, peripheral edema or shortness of breath. Risk factors for coronary artery disease include family history, obesity, dyslipidemia and sedentary lifestyle. Past treatments include calcium channel blockers, beta blockers and angiotensin blockers. The current treatment provides significant improvement. There is no history of kidney disease, CAD/MI, CVA, heart failure or a thyroid problem.      Review of Systems  Constitutional: Negative.   HENT: Negative.   Eyes: Negative.   Respiratory: Negative.  Negative for shortness of breath.   Cardiovascular: Positive for palpitations.  Gastrointestinal: Negative.   Endocrine: Negative.   Genitourinary: Negative.   Musculoskeletal: Negative.   Neurological: Negative.  Negative for headaches.  Hematological: Negative.   Psychiatric/Behavioral: Negative.   All other systems reviewed and are negative.      Objective:   Physical Exam  Constitutional: She is oriented to person, place, and time. She appears well-developed and well-nourished. No distress.  HENT:  Head: Normocephalic and atraumatic.  Right Ear: External ear normal.  Left Ear: External ear normal.  Nose: Nose normal.  Mouth/Throat: Oropharynx is clear and moist.  Eyes: Pupils are equal, round, and reactive to light.  Neck: Normal range of motion. Neck supple. No thyromegaly present.  Cardiovascular: Normal rate, regular rhythm, normal heart sounds and intact distal pulses.   No murmur heard. Pulmonary/Chest: Effort normal  and breath sounds normal. No respiratory distress. She has no wheezes.  Abdominal: Soft. Bowel sounds are normal. She exhibits no distension. There is no tenderness.  Musculoskeletal: Normal range of motion. She exhibits no edema or tenderness.  Neurological: She is alert and oriented to person, place, and time. She has normal reflexes. No cranial nerve deficit.  Skin: Skin is warm and dry.  Psychiatric: She has a normal mood and affect. Her behavior is normal. Judgment and thought content normal.  Vitals reviewed.   BP 122/87 mmHg  Pulse 99  Temp(Src) 100 F (37.8 C) (Oral)  Ht $R'5\' 2"'md$  (1.575 m)  Wt 236 lb 9.6 oz (107.321 kg)  BMI 43.26 kg/m2       Assessment & Plan:  1. Essential hypertension, benign --Daily blood pressure log given with instructions on how to fill out and told to bring to next visit -Dash diet information given -Exercise encouraged - Stress Management  -Continue current meds -RTO in 3 months for chronic follow up - [place], FNP

## 2015-02-04 LAB — BMP8+EGFR
BUN/Creatinine Ratio: 16 (ref 9–23)
BUN: 21 mg/dL (ref 6–24)
CHLORIDE: 98 mmol/L (ref 97–108)
CO2: 24 mmol/L (ref 18–29)
CREATININE: 1.32 mg/dL — AB (ref 0.57–1.00)
Calcium: 9.9 mg/dL (ref 8.7–10.2)
GFR calc Af Amer: 57 mL/min/{1.73_m2} — ABNORMAL LOW (ref >59)
GFR calc non Af Amer: 50 mL/min/{1.73_m2} — ABNORMAL LOW (ref >59)
Glucose: 95 mg/dL (ref 65–99)
Potassium: 3.6 mmol/L (ref 3.5–5.2)
Sodium: 140 mmol/L (ref 134–144)

## 2015-04-06 ENCOUNTER — Other Ambulatory Visit: Payer: Self-pay

## 2015-04-06 DIAGNOSIS — Z1231 Encounter for screening mammogram for malignant neoplasm of breast: Secondary | ICD-10-CM

## 2015-05-10 ENCOUNTER — Encounter: Payer: Self-pay | Admitting: Family

## 2015-05-10 ENCOUNTER — Ambulatory Visit (INDEPENDENT_AMBULATORY_CARE_PROVIDER_SITE_OTHER): Payer: Medicaid Other | Admitting: Family

## 2015-05-10 VITALS — BP 152/96 | HR 105 | Temp 98.6°F | Ht 62.0 in | Wt 236.4 lb

## 2015-05-10 DIAGNOSIS — F331 Major depressive disorder, recurrent, moderate: Secondary | ICD-10-CM | POA: Diagnosis not present

## 2015-05-10 DIAGNOSIS — I10 Essential (primary) hypertension: Secondary | ICD-10-CM

## 2015-05-10 DIAGNOSIS — Z23 Encounter for immunization: Secondary | ICD-10-CM

## 2015-05-10 DIAGNOSIS — G43009 Migraine without aura, not intractable, without status migrainosus: Secondary | ICD-10-CM | POA: Diagnosis not present

## 2015-05-10 DIAGNOSIS — E876 Hypokalemia: Secondary | ICD-10-CM

## 2015-05-10 DIAGNOSIS — E785 Hyperlipidemia, unspecified: Secondary | ICD-10-CM

## 2015-05-10 MED ORDER — LOSARTAN POTASSIUM-HCTZ 100-25 MG PO TABS: 1.0000 | ORAL_TABLET | Freq: Every day | ORAL | Status: DC

## 2015-05-10 MED ORDER — PRAVASTATIN SODIUM 40 MG PO TABS: 40.0000 mg | ORAL_TABLET | Freq: Every day | ORAL | Status: DC

## 2015-05-10 MED ORDER — AMLODIPINE BESYLATE 10 MG PO TABS: 10.0000 mg | ORAL_TABLET | Freq: Every day | ORAL | Status: DC

## 2015-05-10 NOTE — Progress Notes (Signed)
Subjective:    Patient ID: Kelli Walls, female    DOB: August 26, 1970, 44 y.o.   MRN: [id number]  Pt presents to the office today for chronic follow up.  Hypertension This is a chronic problem. The current episode started more than 1 year ago. The problem has been waxing and waning since onset. The problem is uncontrolled. Pertinent negatives include no anxiety, headaches, palpitations, peripheral edema or shortness of breath. Risk factors for coronary artery disease include family history, obesity, dyslipidemia and sedentary lifestyle. Past treatments include calcium channel blockers. The current treatment provides mild improvement. There is no history of kidney disease, CAD/MI, CVA, heart failure or a thyroid problem.  Depression        This is a chronic problem.  The current episode started more than 1 year ago.   The onset quality is gradual.   The problem occurs rarely.  Associated symptoms include no helplessness, no hopelessness, no restlessness, no headaches, not sad and no suicidal ideas.  Past treatments include SNRIs - Serotonin and norepinephrine reuptake inhibitors.   Pertinent negatives include no thyroid problem and no anxiety. Migraine  This is a chronic problem. The current episode started more than 1 year ago. The problem occurs intermittently. The problem has been waxing and waning. The pain is at a severity of 5/10. The pain is mild. Her past medical history is significant for hypertension.  Hyperlipidemia This is a chronic problem. The current episode started more than 1 year ago. The problem is uncontrolled. Recent lipid tests were reviewed and are high. She has no history of diabetes. Pertinent negatives include no leg pain or shortness of breath. Current antihyperlipidemic treatment includes statins. The current treatment provides moderate improvement of lipids. Risk factors for coronary artery disease include dyslipidemia, hypertension, obesity and family history.      Review  of Systems  Constitutional: Negative.   HENT: Negative.   Eyes: Negative.   Respiratory: Negative.  Negative for shortness of breath.   Cardiovascular: Negative.  Negative for palpitations.  Gastrointestinal: Negative.   Endocrine: Negative.   Genitourinary: Negative.   Musculoskeletal: Negative.   Neurological: Negative.  Negative for headaches.  Hematological: Negative.   Psychiatric/Behavioral: Positive for depression. Negative for suicidal ideas.  All other systems reviewed and are negative.      Objective:   Physical Exam  Constitutional: She is oriented to person, place, and time. She appears well-developed and well-nourished. No distress.  HENT:  Head: Normocephalic and atraumatic.  Right Ear: External ear normal.  Left Ear: External ear normal.  Nose: Nose normal.  Mouth/Throat: Oropharynx is clear and moist.  Eyes: Pupils are equal, round, and reactive to light.  Neck: Normal range of motion. Neck supple. No thyromegaly present.  Cardiovascular: Normal rate, regular rhythm, normal heart sounds and intact distal pulses.   No murmur heard. Pulmonary/Chest: Effort normal and breath sounds normal. No respiratory distress. She has no wheezes.  Abdominal: Soft. Bowel sounds are normal. She exhibits no distension. There is no tenderness.  Musculoskeletal: Normal range of motion. She exhibits no edema or tenderness.  Neurological: She is alert and oriented to person, place, and time. She has normal reflexes. No cranial nerve deficit.  Skin: Skin is warm and dry.  Psychiatric: She has a normal mood and affect. Her behavior is normal. Judgment and thought content normal.  Vitals reviewed.   BP 152/96 mmHg  Pulse 105  Temp(Src) 98.6 F (37 C) (Oral)  Ht _0  (1.575 m)  Wt 236  lb 6.4 oz (107.23 kg)  BMI 43.23 kg/m2       Assessment & Plan:  1. Essential hypertension, benign -Pt states she has only been taking her Norvac 10 mg- She has not been taking her Hyzaar or  Metoprolol -I reordered her Hyzaar today -Daily blood pressure log given with instructions on how to fill out and told to bring to next visit -Dash diet information given -Exercise encouraged - Stress Management  -Continue current meds -RTO in 2 weeks - CMP14+EGFR - losartan-hydrochlorothiazide (HYZAAR) 100-25 MG tablet; Take 1 tablet by mouth daily.  Dispense: 30 tablet; Refill: 11 - amLODipine (NORVASC) 10 MG tablet; Take 1 tablet (10 mg total) by mouth daily.  Dispense: 90 tablet; Refill: 3  2. Migraine without aura and without status migrainosus, not intractable - CMP14+EGFR  3. Morbid obesity, unspecified obesity type ([place]) - CMP14+EGFR  4. Moderate episode of recurrent major depressive disorder (HCC) - CMP14+EGFR  5. Hypokalemia - CMP14+EGFR  6. Hyperlipidemia  - CMP14+EGFR - Lipid panel - pravastatin (PRAVACHOL) 40 MG tablet; Take 1 tablet (40 mg total) by mouth at bedtime.  Dispense: 30 tablet   Continue all meds Labs pending Health Maintenance reviewed-Flu vaccine given today Diet and exercise encouraged RTO 2 weeks to recheck HTN  Evelina Dun, FNP

## 2015-05-10 NOTE — Patient Instructions (Signed)

## 2015-05-11 LAB — CMP14+EGFR
A/G RATIO: 1.5 (ref 1.1–2.5)
ALT: 11 IU/L (ref 0–32)
AST: 13 IU/L (ref 0–40)
Albumin: 4.1 g/dL (ref 3.5–5.5)
Alkaline Phosphatase: 50 IU/L (ref 39–117)
BILIRUBIN TOTAL: 0.3 mg/dL (ref 0.0–1.2)
BUN/Creatinine Ratio: 12 (ref 9–23)
BUN: 12 mg/dL (ref 6–24)
CALCIUM: 9.6 mg/dL (ref 8.7–10.2)
CHLORIDE: 104 mmol/L (ref 97–108)
CO2: 23 mmol/L (ref 18–29)
Creatinine, Ser: 1.04 mg/dL — ABNORMAL HIGH (ref 0.57–1.00)
GFR calc Af Amer: 77 mL/min/{1.73_m2} (ref >59)
GFR, EST NON AFRICAN AMERICAN: 66 mL/min/{1.73_m2} (ref >59)
GLOBULIN, TOTAL: 2.8 g/dL (ref 1.5–4.5)
Glucose: 98 mg/dL (ref 65–99)
POTASSIUM: 4.1 mmol/L (ref 3.5–5.2)
SODIUM: 140 mmol/L (ref 134–144)
Total Protein: 6.9 g/dL (ref 6.0–8.5)

## 2015-05-11 LAB — LIPID PANEL
CHOL/HDL RATIO: 4.7 ratio — AB (ref 0.0–4.4)
Cholesterol, Total: 199 mg/dL (ref 100–199)
HDL: 42 mg/dL (ref >39)
LDL Calculated: 121 mg/dL — ABNORMAL HIGH (ref 0–99)
Triglycerides: 182 mg/dL — ABNORMAL HIGH (ref 0–149)
VLDL Cholesterol Cal: 36 mg/dL (ref 5–40)

## 2015-05-12 ENCOUNTER — Other Ambulatory Visit: Payer: Self-pay | Admitting: Family

## 2015-05-12 MED ORDER — ATORVASTATIN CALCIUM 40 MG PO TABS: 40.0000 mg | ORAL_TABLET | Freq: Every day | ORAL | Status: DC

## 2015-05-17 ENCOUNTER — Telehealth: Payer: Self-pay

## 2015-05-17 NOTE — Telephone Encounter (Signed)
Medicaid prior authorized [place]/HCTZ 100/25 for 1 year  [id number]

## 2015-05-19 ENCOUNTER — Ambulatory Visit
Admission: RE | Admit: 2015-05-19 | Discharge: 2015-05-19 | Disposition: A | Discharge status: Home / Self Care | Payer: Medicaid Other | Source: Ambulatory Visit

## 2015-05-19 DIAGNOSIS — Z1231 Encounter for screening mammogram for malignant neoplasm of breast: Secondary | ICD-10-CM

## 2015-05-24 ENCOUNTER — Ambulatory Visit (INDEPENDENT_AMBULATORY_CARE_PROVIDER_SITE_OTHER): Payer: Medicaid Other | Admitting: Family

## 2015-05-24 ENCOUNTER — Encounter: Payer: Self-pay | Admitting: Family

## 2015-05-24 VITALS — BP 120/79 | HR 102 | Temp 98.9°F | Ht 62.0 in | Wt 235.8 lb

## 2015-05-24 DIAGNOSIS — Z23 Encounter for immunization: Secondary | ICD-10-CM

## 2015-05-24 DIAGNOSIS — I10 Essential (primary) hypertension: Secondary | ICD-10-CM | POA: Diagnosis not present

## 2015-05-24 DIAGNOSIS — E8881 Metabolic syndrome: Secondary | ICD-10-CM | POA: Diagnosis not present

## 2015-05-24 NOTE — Addendum Note (Signed)
Addended by: Shelbie Ammons on: 05/24/2015 02:38 PM   Modules accepted: Orders

## 2015-05-24 NOTE — Patient Instructions (Signed)
DASH Eating Plan °DASH stands for "Dietary Approaches to Stop Hypertension." The DASH eating plan is a healthy eating plan that has been shown to reduce high blood pressure (hypertension). Additional health benefits may include reducing the risk of type 2 diabetes mellitus, heart disease, and stroke. The DASH eating plan may also help with weight loss. °WHAT DO I NEED TO KNOW ABOUT THE DASH EATING PLAN? °For the DASH eating plan, you will follow these general guidelines: °· Choose foods with a percent daily value for sodium of less than 5% (as listed on the food label). °· Use salt-free seasonings or herbs instead of table salt or sea salt. °· Check with your health care provider or pharmacist before using salt substitutes. °· Eat lower-sodium products, often labeled as "lower sodium" or "no salt added." °· Eat fresh foods. °· Eat more vegetables, fruits, and low-fat dairy products. °· Choose whole grains. Look for the word "whole" as the first word in the ingredient list. °· Choose fish and skinless chicken or turkey more often than red meat. Limit fish, poultry, and meat to 6 oz (170 g) each day. °· Limit sweets, desserts, sugars, and sugary drinks. °· Choose heart-healthy fats. °· Limit cheese to 1 oz (28 g) per day. °· Eat more home-cooked food and less restaurant, buffet, and fast food. °· Limit fried foods. °· Cook foods using methods other than frying. °· Limit canned vegetables. If you do use them, rinse them well to decrease the sodium. °· When eating at a restaurant, ask that your food be prepared with less salt, or no salt if possible. °WHAT FOODS CAN I EAT? °Seek help from a dietitian for individual calorie needs. °Grains °Whole grain or whole wheat bread. Brown rice. Whole grain or whole wheat pasta. Quinoa, bulgur, and whole grain cereals. Low-sodium cereals. Corn or whole wheat flour tortillas. Whole grain cornbread. Whole grain crackers. Low-sodium crackers. °Vegetables °Fresh or frozen vegetables  (raw, steamed, roasted, or grilled). Low-sodium or reduced-sodium tomato and vegetable juices. Low-sodium or reduced-sodium tomato sauce and paste. Low-sodium or reduced-sodium canned vegetables.  °Fruits °All fresh, canned (in natural juice), or frozen fruits. °Meat and Other Protein Products °Ground beef (85% or leaner), grass-fed beef, or beef trimmed of fat. Skinless chicken or turkey. Ground chicken or turkey. Pork trimmed of fat. All fish and seafood. Eggs. Dried beans, peas, or lentils. Unsalted nuts and seeds. Unsalted canned beans. °Dairy °Low-fat dairy products, such as skim or 1% milk, 2% or reduced-fat cheeses, low-fat ricotta or cottage cheese, or plain low-fat yogurt. Low-sodium or reduced-sodium cheeses. °Fats and Oils °Tub margarines without trans fats. Light or reduced-fat mayonnaise and salad dressings (reduced sodium). Avocado. Safflower, olive, or canola oils. Natural peanut or almond butter. °Other °Unsalted popcorn and pretzels. °The items listed above may not be a complete list of recommended foods or beverages. Contact your dietitian for more options. °WHAT FOODS ARE NOT RECOMMENDED? °Grains °White bread. White pasta. White rice. Refined cornbread. Bagels and croissants. Crackers that contain trans fat. °Vegetables °Creamed or fried vegetables. Vegetables in a cheese sauce. Regular canned vegetables. Regular canned tomato sauce and paste. Regular tomato and vegetable juices. °Fruits °Dried fruits. Canned fruit in light or heavy syrup. Fruit juice. °Meat and Other Protein Products °Fatty cuts of meat. Ribs, chicken wings, bacon, sausage, bologna, salami, chitterlings, fatback, hot dogs, bratwurst, and packaged luncheon meats. Salted nuts and seeds. Canned beans with salt. °Dairy °Whole or 2% milk, cream, half-and-half, and cream cheese. Whole-fat or sweetened yogurt. Full-fat   cheeses or blue cheese. Nondairy creamers and whipped toppings. Processed cheese, cheese spreads, or cheese  curds. °Condiments °Onion and garlic salt, seasoned salt, table salt, and sea salt. Canned and packaged gravies. Worcestershire sauce. Tartar sauce. Barbecue sauce. Teriyaki sauce. Soy sauce, including reduced sodium. Steak sauce. Fish sauce. Oyster sauce. Cocktail sauce. Horseradish. Ketchup and mustard. Meat flavorings and tenderizers. Bouillon cubes. Hot sauce. Tabasco sauce. Marinades. Taco seasonings. Relishes. °Fats and Oils °Butter, stick margarine, lard, shortening, ghee, and bacon fat. Coconut, palm kernel, or palm oils. Regular salad dressings. °Other °Pickles and olives. Salted popcorn and pretzels. °The items listed above may not be a complete list of foods and beverages to avoid. Contact your dietitian for more information. °WHERE CAN I FIND MORE INFORMATION? °National [employer], [employer], [employer]: www.nhlbi.nih.gov/health/health-topics/topics/dash/ °  °This information is not intended to replace advice given to you by your health care provider. Make sure you discuss any questions you have with your health care provider. °  °Document Released: 07/12/2011 Document Revised: 08/13/2014 Document Reviewed: 05/27/2013 °Elsevier Interactive Patient Education ©2016 Elsevier Inc. ° °Hypertension °Hypertension, commonly called high blood pressure, is when the force of blood pumping through your arteries is too strong. Your arteries are the blood vessels that carry blood from your heart throughout your body. A blood pressure reading consists of a higher number over a lower number, such as 110/72. The higher number (systolic) is the pressure inside your arteries when your heart pumps. The lower number (diastolic) is the pressure inside your arteries when your heart relaxes. Ideally you want your blood pressure below 120/80. °Hypertension forces your heart to work harder to pump blood. Your arteries may become narrow or stiff. Having untreated or uncontrolled hypertension can cause heart attack, stroke, kidney  disease, and other problems. °RISK FACTORS °Some risk factors for high blood pressure are controllable. Others are not.  °Risk factors you cannot control include:  °· Race. You may be at higher risk if you are African American. °· Age. Risk increases with age. °· Gender. Men are at higher risk than women before age 45 years. After age 65, women are at higher risk than men. °Risk factors you can control include: °· Not getting enough exercise or physical activity. °· Being overweight. °· Getting too much fat, sugar, calories, or salt in your diet. °· Drinking too much alcohol. °SIGNS AND SYMPTOMS °Hypertension does not usually cause signs or symptoms. Extremely high blood pressure (hypertensive crisis) may cause headache, anxiety, shortness of breath, and nosebleed. °DIAGNOSIS °To check if you have hypertension, your health care provider will measure your blood pressure while you are seated, with your arm held at the level of your heart. It should be measured at least twice using the same arm. Certain conditions can cause a difference in blood pressure between your right and left arms. A blood pressure reading that is higher than normal on one occasion does not mean that you need treatment. If it is not clear whether you have high blood pressure, you may be asked to return on a different day to have your blood pressure checked again. Or, you may be asked to monitor your blood pressure at home for 1 or more weeks. °TREATMENT °Treating high blood pressure includes making lifestyle changes and possibly taking medicine. Living a healthy lifestyle can help lower high blood pressure. You may need to change some of your habits. °Lifestyle changes may include: °· Following the DASH diet. This diet is high in fruits, vegetables, and whole   grains. It is low in salt, red meat, and added sugars. °· Keep your sodium intake below 2,300 mg per day. °· Getting at least 30-45 minutes of aerobic exercise at least 4 times per  week. °· Losing weight if necessary. °· Not smoking. °· Limiting alcoholic beverages. °· Learning ways to reduce stress. °Your health care provider may prescribe medicine if lifestyle changes are not enough to get your blood pressure under control, and if one of the following is true: °· You are 18-59 years of age and your systolic blood pressure is above 140. °· You are 60 years of age or older, and your systolic blood pressure is above 150. °· Your diastolic blood pressure is above 90. °· You have diabetes, and your systolic blood pressure is over 140 or your diastolic blood pressure is over 90. °· You have kidney disease and your blood pressure is above 140/90. °· You have heart disease and your blood pressure is above 140/90. °Your personal target blood pressure may vary depending on your medical conditions, your age, and other factors. °HOME CARE INSTRUCTIONS °· Have your blood pressure rechecked as directed by your health care provider.   °· Take medicines only as directed by your health care provider. Follow the directions carefully. Blood pressure medicines must be taken as prescribed. The medicine does not work as well when you skip doses. Skipping doses also puts you at risk for problems. °· Do not smoke.   °· Monitor your blood pressure at home as directed by your health care provider.  °SEEK MEDICAL CARE IF:  °· You think you are having a reaction to medicines taken. °· You have recurrent headaches or feel dizzy. °· You have swelling in your ankles. °· You have trouble with your vision. °SEEK IMMEDIATE MEDICAL CARE IF: °· You develop a severe headache or confusion. °· You have unusual weakness, numbness, or feel faint. °· You have severe chest or abdominal pain. °· You vomit repeatedly. °· You have trouble breathing. °MAKE SURE YOU:  °· Understand these instructions. °· Will watch your condition. °· Will get help right away if you are not doing well or get worse. °  °This information is not intended to  replace advice given to you by your health care provider. Make sure you discuss any questions you have with your health care provider. °  °Document Released: 07/23/2005 Document Revised: 12/07/2014 Document Reviewed: 05/15/2013 °Elsevier Interactive Patient Education ©2016 Elsevier Inc. ° °

## 2015-05-24 NOTE — Progress Notes (Signed)
   Subjective:    Patient ID: Kelli Walls, female    DOB: Jul 15, 1971, 44 y.o.   MRN: [id number]  Pt presents to the office today for HTN. PT's BP is  Hypertension This is a chronic problem. The current episode started more than 1 year ago. The problem has been resolved since onset. The problem is controlled. Pertinent negatives include no anxiety, headaches, malaise/fatigue, palpitations, peripheral edema or shortness of breath. Risk factors for coronary artery disease include dyslipidemia, family history and obesity. Past treatments include calcium channel blockers, angiotensin blockers and diuretics. The current treatment provides significant improvement. There is no history of kidney disease, CAD/MI, CVA, heart failure or a thyroid problem.      Review of Systems  Constitutional: Negative for malaise/fatigue.  HENT: Negative.   Eyes: Negative.   Respiratory: Negative.  Negative for shortness of breath.   Cardiovascular: Negative.  Negative for palpitations.  Gastrointestinal: Negative.   Endocrine: Negative.   Genitourinary: Negative.   Musculoskeletal: Negative.   Neurological: Negative.  Negative for headaches.  Hematological: Negative.   Psychiatric/Behavioral: Negative.   All other systems reviewed and are negative.      Objective:   Physical Exam  Constitutional: She is oriented to person, place, and time. She appears well-developed and well-nourished. No distress.  HENT:  Head: Normocephalic and atraumatic.  Right Ear: External ear normal.  Mouth/Throat: Oropharynx is clear and moist.  Eyes: Pupils are equal, round, and reactive to light.  Neck: Normal range of motion. Neck supple. No thyromegaly present.  Cardiovascular: Normal rate, regular rhythm, normal heart sounds and intact distal pulses.   No murmur heard. Pulmonary/Chest: Effort normal and breath sounds normal. No respiratory distress. She has no wheezes.  Abdominal: Soft. Bowel sounds are normal. She exhibits  no distension. There is no tenderness.  Musculoskeletal: Normal range of motion. She exhibits no edema or tenderness.  Neurological: She is alert and oriented to person, place, and time. She has normal reflexes. No cranial nerve deficit.  Skin: Skin is warm and dry.  Psychiatric: She has a normal mood and affect. Her behavior is normal. Judgment and thought content normal.  Vitals reviewed.     BP 120/79 mmHg  Pulse 102  Temp(Src) 98.9 F (37.2 C) (Oral)  Ht _0  (1.575 m)  Wt 235 lb 12.8 oz (106.958 kg)  BMI 43.12 kg/m2     Assessment & Plan:  1. Essential hypertension, benign --Daily blood pressure log given with instructions on how to fill out and told to bring to next visit -Dash diet information given -Exercise encouraged - Stress Management  -Continue current meds -RTO in 6 months - ODG4+ZDVI  2. Metabolic syndrome Low carb diet   Evelina Dun, FNP

## 2015-06-16 ENCOUNTER — Ambulatory Visit (INDEPENDENT_AMBULATORY_CARE_PROVIDER_SITE_OTHER): Payer: Medicaid Other | Admitting: Family

## 2015-06-16 ENCOUNTER — Ambulatory Visit (INDEPENDENT_AMBULATORY_CARE_PROVIDER_SITE_OTHER): Payer: Medicaid Other

## 2015-06-16 ENCOUNTER — Encounter: Payer: Self-pay | Admitting: Family

## 2015-06-16 VITALS — BP 136/101 | HR 99 | Temp 99.5°F | Ht 62.0 in | Wt 237.0 lb

## 2015-06-16 DIAGNOSIS — M25572 Pain in left ankle and joints of left foot: Secondary | ICD-10-CM

## 2015-06-16 DIAGNOSIS — S93402A Sprain of unspecified ligament of left ankle, initial encounter: Secondary | ICD-10-CM

## 2015-06-16 MED ORDER — NAPROXEN 500 MG PO TABS: 500.0000 mg | ORAL_TABLET | Freq: Two times a day (BID) | ORAL | Status: DC

## 2015-06-16 NOTE — Progress Notes (Signed)
   Subjective:    Patient ID: Kelli Walls, female    DOB: [birth year], 44 y.o.   MRN: [id number]  Ankle Pain  The incident occurred 3 to 5 days ago. The incident occurred in the yard. The injury mechanism was a twisting injury. The pain is present in the left ankle. The quality of the pain is described as aching. The pain is at a severity of 5/10. The pain is moderate. The pain has been intermittent since onset. Associated symptoms include muscle weakness and tingling. She reports no foreign bodies present. The symptoms are aggravated by weight bearing. She has tried acetaminophen for the symptoms. The treatment provided mild relief.      Review of Systems  Constitutional: Negative.   HENT: Negative.   Eyes: Negative.   Respiratory: Negative.  Negative for shortness of breath.   Cardiovascular: Negative.  Negative for palpitations.  Gastrointestinal: Negative.   Endocrine: Negative.   Genitourinary: Negative.   Musculoskeletal: Negative.   Neurological: Positive for tingling. Negative for headaches.  Hematological: Negative.   Psychiatric/Behavioral: Negative.   All other systems reviewed and are negative.      Objective:   Physical Exam  Constitutional: She is oriented to person, place, and time. She appears well-developed and well-nourished. No distress.  HENT:  Head: Normocephalic and atraumatic.  Eyes: Pupils are equal, round, and reactive to light.  Neck: Normal range of motion. Neck supple. No thyromegaly present.  Cardiovascular: Normal rate, regular rhythm, normal heart sounds and intact distal pulses.   No murmur heard. Pulmonary/Chest: Effort normal and breath sounds normal. No respiratory distress. She has no wheezes.  Abdominal: Soft. Bowel sounds are normal. She exhibits no distension. There is no tenderness.  Musculoskeletal: Normal range of motion. She exhibits edema. She exhibits no tenderness.  Neurological: She is alert and oriented to person, place, and time.  She has normal reflexes. No cranial nerve deficit.  Skin: Skin is warm and dry.  Psychiatric: She has a normal mood and affect. Her behavior is normal. Judgment and thought content normal.  Vitals reviewed.  Left ankle- WN Preliminary reading by Evelina Dun, FNP [facility]   BP 136/101 mmHg  Pulse 99  Temp(Src) 99.5 F (37.5 C) (Oral)  Ht 5\' 2"  (1.575 m)  Wt 237 lb (107.502 kg)  BMI 43.34 kg/m2      Assessment & Plan:  1. Left ankle pain - DG Ankle Complete Left; Future  2. Left ankle sprain, initial encounter -Rest -Ice -Compression -Elevation -Exercises discussed -RTO prn - naproxen (NAPROSYN) 500 MG tablet; Take 1 tablet (500 mg total) by mouth 2 (two) times daily with a meal.  Dispense: 30 tablet; Refill: 0  Evelina Dun, FNP

## 2015-06-16 NOTE — Patient Instructions (Signed)

## 2015-07-18 ENCOUNTER — Encounter: Payer: Self-pay | Admitting: Nurse Practitioner

## 2015-07-18 ENCOUNTER — Ambulatory Visit (INDEPENDENT_AMBULATORY_CARE_PROVIDER_SITE_OTHER): Payer: Medicaid Other | Admitting: Nurse Practitioner

## 2015-07-18 VITALS — BP 134/87 | HR 81 | Ht 62.0 in | Wt 230.6 lb

## 2015-07-18 DIAGNOSIS — G43009 Migraine without aura, not intractable, without status migrainosus: Secondary | ICD-10-CM

## 2015-07-18 MED ORDER — SUMATRIPTAN SUCCINATE 50 MG PO TABS
50.0000 mg | ORAL_TABLET | ORAL | Status: DC | PRN
Start: 2015-07-18 — End: 2016-07-17

## 2015-07-18 NOTE — Patient Instructions (Signed)
Continue Imitrex acutely for migraine, will refill Follow-up yearly and when necessary

## 2015-07-18 NOTE — Progress Notes (Signed)
[facility]  PATIENT: Kelli Walls DOB: 1971-06-06   REASON FOR VISIT: Follow-up for migraine HISTORY FROM: Patient    HISTORY OF PRESENT ILLNESS: HISTORY:Kelli Walls is a 44 yo RH African American female, accompanied by her mother, referred by her primary care physician Dr. Redge Gainer for evaluation of mild abnormal MRI scan She was born only 4 pounds, developmentally delayed, she learned to walk at 80 and 44 years old, was diagnosed with mild mental retardation, she graduated from special grade school at 12th grade, lives with her mother now, she has frequent raging outburst when she was younger, which is even worse now. In March 24 2013, she had argument with her boyfriend, began to experience elevated blood pressure, numbness at her mouth, at her forehead, with associated severe headache, this led to MRI of the brain, scattered subcortical T2 hyperintensities are likely within normal limits for age. The largest lesion is in the anterior left frontal lobe, measuring 8 mm.  She has a history of headaches since teenager, her headache are retrorbital area severe pounding headache with associated light noise sensitivity, pressure, she sees flashlight in her visual field, lasting for 15 minutes, headaches can last up to 6 hours, trigger for her headaches are stress, exertion, menstruation, She took tylenol, ibuprofen, Excedrin Migraine are helpful too.   UPDATE 07/15/2014: Her headaches has much improved, only a couple times a month, sumatriptan has been very helpful,However, she has stopped taking her medications including antihypertension, antidepression medications without medical advice, she has missed her psychiatry appointment, she denies significant depression at this point. UPDATE 07/18/2015 Kelli Walls, 44 year old female returns for follow-up with her mother. She has a history of migraine headaches and is currently having less than 1 headache per month. Her headaches  are relieved with Imitrex. She returns for reevaluation   REVIEW OF SYSTEMS: Full 14 system review of systems performed and notable only for those listed, all others are neg:  Constitutional: neg  Cardiovascular: neg Ear/Nose/Throat: neg  Skin: neg Eyes: neg Respiratory: Cough Gastroitestinal: neg  Hematology/Lymphatic: neg  Endocrine: neg Musculoskeletal:neg Allergy/Immunology: neg Neurological: neg Psychiatric: neg Sleep : neg   ALLERGIES: Allergies  Allergen Reactions  . Penicillins Itching    HOME MEDICATIONS: Outpatient Prescriptions Prior to Visit  Medication Sig Dispense Refill  . amLODipine (NORVASC) 10 MG tablet Take 1 tablet (10 mg total) by mouth daily. 90 tablet 3  . atorvastatin (LIPITOR) 40 MG tablet Take 1 tablet (40 mg total) by mouth daily. 90 tablet 3  . citalopram (CELEXA) 10 MG tablet Take 1 tablet (10 mg total) by mouth daily. 30 tablet 0  . losartan-hydrochlorothiazide (HYZAAR) 100-25 MG tablet Take 1 tablet by mouth daily. 30 tablet 11  . metoprolol succinate (TOPROL-XL) 100 MG 24 hr tablet Take 1 tablet (100 mg total) by mouth daily. Take with or immediately following a meal. 90 tablet 3  . Multiple Vitamin (MULTIVITAMIN WITH MINERALS) TABS tablet Take 1 tablet by mouth daily.    . naproxen (NAPROSYN) 500 MG tablet Take 1 tablet (500 mg total) by mouth 2 (two) times daily with a meal. 30 tablet 0  . risperiDONE (RISPERDAL M-TABS) 0.5 MG disintegrating tablet Take 1 tablet (0.5 mg total) by mouth 2 (two) times daily. 60 tablet 0  . SUMAtriptan (IMITREX) 50 MG tablet Take 1 tablet (50 mg total) by mouth every 2 (two) hours as needed for migraine. May repeat in 2 hours if headache persists or recurs. 15 tablet 11   No facility-administered  medications prior to visit.    PAST MEDICAL HISTORY: Past Medical History  Diagnosis Date  . Hypertension   . Asthma   . Depression   . Suicide attempt ([place])   . Mental retardation, mild (I.Q. 50-70)   .  Hyperlipidemia   . Migraine     PAST SURGICAL HISTORY: Past Surgical History  Procedure Laterality Date  . Tonsillectomy    . Endometrial ablation      FAMILY HISTORY: Family History  Problem Relation Age of Onset  . Diabetes Mother   . Hyperlipidemia Mother     SOCIAL HISTORY: Social History   Social History  . Marital Status: Divorced    Spouse Name: N/A  . Number of Children: 0  . Years of Education: 12   Occupational History  . Unemployed     Social History Main Topics  . Smoking status: Never Smoker   . Smokeless tobacco: Never Used  . Alcohol Use: No     Comment: pt reports a history of drinking etoh but denies current use  . Drug Use: No  . Sexual Activity: No   Other Topics Concern  . Not on file   Social History Narrative   Patient lives at home with her mother Mellody Dance.    Patient is single.    Patient has no children.    Patient has 12th grade education.      PHYSICAL EXAM  Filed Vitals:   07/18/15 1311  BP: 134/87  Pulse: 81  Height: 5\' 2"  (1.575 m)  Weight: 230 lb 9.6 oz (104.599 kg)   Body mass index is 42.17 kg/(m^2). Generalized: In no acute distress Neck: Supple, no carotid bruits  Cardiac: Regular rate rhythm Pulmonary: Clear to auscultation bilaterally Musculoskeletal: No deformity  Neurological examination Mentation: Alert oriented to time, place, history taking, and causual conversation Cranial nerve II-XII: Pupils were equal round reactive to light. visual field were full on confrontational test. She has difficulty with left eye horizontal movement, could not do left eye abduction or adduction. She also has difficulty with right eye abduction. Mild asymmetry, underdeveloped right face. hearing was intact to finger rubbing bilaterally. Uvula tongue midline. head turning and shoulder shrug and were normal and symmetric.Tongue protrusion into cheek strength was normal. Motor: normal tone, bulk and strength. Sensory: Intact to fine  touch, pinprick, preserved vibratory sensation, and proprioception at toes. Coordination: Normal finger to nose, heel-to-shin bilaterally there was no truncal ataxia Gait: Rising up from seated position without assistance, normal stance, without trunk ataxia, moderate stride, good arm swing, smooth turning, able to perform tiptoe, and heel walking without difficulty.  Romberg signs: Negative Deep tendon reflexes: Brachioradialis 2/2, biceps 2/2, triceps 2/2, patellar 2/2, Achilles 2/2, plantar responses were flexor bilaterally.  DIAGNOSTIC DATA (LABS, IMAGING, TESTING) - I reviewed patient records, labs, notes, testing and imaging myself where available.      Component Value Date/Time   NA 140 05/10/2015 1200   NA 136 05/23/2013 0434   K 4.1 05/10/2015 1200   CL 104 05/10/2015 1200   CO2 23 05/10/2015 1200   GLUCOSE 98 05/10/2015 1200   GLUCOSE 91 05/23/2013 0434   BUN 12 05/10/2015 1200   BUN 8 05/23/2013 0434   CREATININE 1.04* 05/10/2015 1200   CREATININE 1.12* 12/19/2012 1017   CALCIUM 9.6 05/10/2015 1200   PROT 6.9 05/10/2015 1200   PROT 6.6 05/23/2013 0434   ALBUMIN 4.1 05/10/2015 1200   ALBUMIN 3.3* 05/23/2013 0434   AST 13 05/10/2015 1200  ALT 11 05/10/2015 1200   ALKPHOS 50 05/10/2015 1200   BILITOT 0.3 05/10/2015 1200   BILITOT 0.2* 05/23/2013 0434   GFRNONAA 66 05/10/2015 1200   GFRAA 77 05/10/2015 1200   Lab Results  Component Value Date   CHOL 199 05/10/2015   HDL 42 05/10/2015   LDLCALC 121* 05/10/2015   TRIG 182* 05/10/2015   CHOLHDL 4.7* 05/10/2015    ASSESSMENT AND PLAN  44 y.o. year old female  has a past medical history of Hypertension; Asthma; Depression; Suicide attempt ([place]); Mental retardation, mild (I.Q. 50-70); Hyperlipidemia; and Migraine. here to follow-up. She has less than one headache per month, currently relieved with Imitrex.  Continue Imitrex acutely for migraine, will refill Follow-up yearly and when necessary Dennie Bible,  [facility], [facility], APRN  [facility] [address], [place], [place] [address] [phone]

## 2015-07-18 NOTE — Progress Notes (Signed)
I have reviewed and agreed above plan. 

## 2015-08-22 ENCOUNTER — Telehealth: Payer: Self-pay | Admitting: Family

## 2015-08-22 DIAGNOSIS — F339 Major depressive disorder, recurrent, unspecified: Secondary | ICD-10-CM

## 2015-08-22 NOTE — Telephone Encounter (Signed)
Stp and she currently sees psych in [place] but wants to see someone in [place]. Referral placed.

## 2015-09-29 ENCOUNTER — Encounter: Payer: Self-pay | Admitting: Family Medicine

## 2015-09-29 ENCOUNTER — Ambulatory Visit (INDEPENDENT_AMBULATORY_CARE_PROVIDER_SITE_OTHER): Payer: Medicaid Other | Admitting: Family Medicine

## 2015-09-29 VITALS — BP 138/89 | HR 99 | Temp 97.7°F | Ht 62.0 in | Wt 230.6 lb

## 2015-09-29 DIAGNOSIS — J029 Acute pharyngitis, unspecified: Secondary | ICD-10-CM

## 2015-09-29 DIAGNOSIS — J0110 Acute frontal sinusitis, unspecified: Secondary | ICD-10-CM

## 2015-09-29 LAB — POCT RAPID STREP A (OFFICE): RAPID STREP A SCREEN: NEGATIVE

## 2015-09-29 MED ORDER — AZITHROMYCIN 250 MG PO TABS: ORAL_TABLET | ORAL | Status: DC

## 2015-09-29 MED ORDER — BENZONATATE 200 MG PO CAPS: 200.0000 mg | ORAL_CAPSULE | Freq: Two times a day (BID) | ORAL | Status: DC | PRN

## 2015-09-29 MED ORDER — FLUTICASONE PROPIONATE 50 MCG/ACT NA SUSP: 2.0000 | Freq: Every day | NASAL | Status: DC

## 2015-09-29 NOTE — Addendum Note (Signed)
Addended by: Pollyann Kennedy F on: 09/29/2015 01:28 PM   Modules accepted: Orders

## 2015-09-29 NOTE — Progress Notes (Signed)
   HPI  Patient presents today here for acute visit.  Patient explains that over the last 3-4 days she's had cough, sore throat, nasal congestion, left-sided frontal sinus pressure, and intermittent tinnitus.  She denies any fevers or body aches. She denies any shortness of breath or food or fluid intolerance.  She also feels she has very large swollen tender lymph nodes.  PMH: Smoking status noted ROS: Per HPI  Objective: BP 138/89 mmHg  Pulse 99  Temp(Src) 97.7 F (36.5 C) (Oral)  Ht 5\' 2"  (1.575 m)  Wt 230 lb 9.6 oz (104.599 kg)  BMI 42.17 kg/m2 Gen: NAD, alert, cooperative with exam HEENT: NCAT, TMs bilaterally normal to partially obscured by cerumen, nares with some swelling of the turbinates bilaterally, oropharynx clear, left-sided frontal sinus tenderness to palpation CV: RRR, good S1/S2, no murmur Resp: CTABL, no wheezes, non-labored Ext: No edema, warm Neuro: Alert and oriented, No gross deficits  Assessment and plan:  # Acute frontal sinusitis Treat with azithro - early in course with sore throat and cough so I chose longer duration Penicillin allergic RTC with any concerns Flonase and tessalon as well    Orders Placed This Encounter  Procedures  . Culture, Group A Strep    Order Specific Question:  Source    Answer:  throat  . POCT rapid strep A    Meds ordered this encounter  Medications  . azithromycin (ZITHROMAX) 250 MG tablet    Sig: Take 2 tablets on day 1 and 1 tablet daily after that    Dispense:  6 tablet    Refill:  0  . benzonatate (TESSALON) 200 MG capsule    Sig: Take 1 capsule (200 mg total) by mouth 2 (two) times daily as needed for cough.    Dispense:  20 capsule    Refill:  0  . fluticasone (FLONASE) 50 MCG/ACT nasal spray    Sig: Place 2 sprays into both nostrils daily.    Dispense:  16 g    Refill:  M[place], MD [facility] 09/29/2015, 1:22 PM

## 2015-09-29 NOTE — Patient Instructions (Addendum)
Great to meet you!  Be sure to finish all of the antibiotics  Try the flonase 2 sprays per nostril once daily   Sinusitis, Adult Sinusitis is redness, soreness, and puffiness (inflammation) of the air pockets in the bones of your face (sinuses). The redness, soreness, and puffiness can cause air and mucus to get trapped in your sinuses. This can allow germs to grow and cause an infection.  HOME CARE   Drink enough fluids to keep your pee (urine) clear or pale yellow.  Use a humidifier in your home.  Run a hot shower to create steam in the bathroom. Sit in the bathroom with the door closed. Breathe in the steam 3-4 times a day.  Put a warm, moist washcloth on your face 3-4 times a day, or as told by your doctor.  Use salt water sprays (saline sprays) to wet the thick fluid in your nose. This can help the sinuses drain.  Only take medicine as told by your doctor. GET HELP RIGHT AWAY IF:   Your pain gets worse.  You have very bad headaches.  You are sick to your stomach (nauseous).  You throw up (vomit).  You are very sleepy (drowsy) all the time.  Your face is puffy (swollen).  Your vision changes.  You have a stiff neck.  You have trouble breathing. MAKE SURE YOU:   Understand these instructions.  Will watch your condition.  Will get help right away if you are not doing well or get worse.   This information is not intended to replace advice given to you by your health care provider. Make sure you discuss any questions you have with your health care provider.   Document Released: 01/09/2008 Document Revised: 08/13/2014 Document Reviewed: 02/26/2012 Elsevier Interactive Patient Education [employer].

## 2015-11-22 ENCOUNTER — Encounter: Payer: Self-pay | Admitting: Family

## 2015-11-22 ENCOUNTER — Ambulatory Visit (INDEPENDENT_AMBULATORY_CARE_PROVIDER_SITE_OTHER): Payer: Medicaid Other | Admitting: Family

## 2015-11-22 ENCOUNTER — Ambulatory Visit: Payer: Medicaid Other | Admitting: Family

## 2015-11-22 VITALS — BP 163/100 | HR 80 | Temp 97.3°F | Ht 62.0 in | Wt 232.6 lb

## 2015-11-22 DIAGNOSIS — F331 Major depressive disorder, recurrent, moderate: Secondary | ICD-10-CM | POA: Diagnosis not present

## 2015-11-22 DIAGNOSIS — E785 Hyperlipidemia, unspecified: Secondary | ICD-10-CM | POA: Diagnosis not present

## 2015-11-22 DIAGNOSIS — G43009 Migraine without aura, not intractable, without status migrainosus: Secondary | ICD-10-CM | POA: Diagnosis not present

## 2015-11-22 DIAGNOSIS — I10 Essential (primary) hypertension: Secondary | ICD-10-CM | POA: Diagnosis not present

## 2015-11-22 DIAGNOSIS — E876 Hypokalemia: Secondary | ICD-10-CM

## 2015-11-22 DIAGNOSIS — E8881 Metabolic syndrome: Secondary | ICD-10-CM

## 2015-11-22 MED ORDER — ATORVASTATIN CALCIUM 40 MG PO TABS: 40.0000 mg | ORAL_TABLET | Freq: Every day | ORAL | Status: DC

## 2015-11-22 MED ORDER — LOSARTAN POTASSIUM-HCTZ 100-25 MG PO TABS: 1.0000 | ORAL_TABLET | Freq: Every day | ORAL | Status: DC

## 2015-11-22 MED ORDER — CITALOPRAM HYDROBROMIDE 10 MG PO TABS: 10.0000 mg | ORAL_TABLET | Freq: Every day | ORAL | Status: DC

## 2015-11-22 MED ORDER — METOPROLOL SUCCINATE ER 100 MG PO TB24: 100.0000 mg | ORAL_TABLET | Freq: Every day | ORAL | Status: DC

## 2015-11-22 NOTE — Patient Instructions (Signed)
DASH Eating Plan °DASH stands for "Dietary Approaches to Stop Hypertension." The DASH eating plan is a healthy eating plan that has been shown to reduce high blood pressure (hypertension). Additional health benefits may include reducing the risk of type 2 diabetes mellitus, heart disease, and stroke. The DASH eating plan may also help with weight loss. °WHAT DO I NEED TO KNOW ABOUT THE DASH EATING PLAN? °For the DASH eating plan, you will follow these general guidelines: °· Choose foods with a percent daily value for sodium of less than 5% (as listed on the food label). °· Use salt-free seasonings or herbs instead of table salt or sea salt. °· Check with your health care provider or pharmacist before using salt substitutes. °· Eat lower-sodium products, often labeled as "lower sodium" or "no salt added." °· Eat fresh foods. °· Eat more vegetables, fruits, and low-fat dairy products. °· Choose whole grains. Look for the word "whole" as the first word in the ingredient list. °· Choose fish and skinless chicken or turkey more often than red meat. Limit fish, poultry, and meat to 6 oz (170 g) each day. °· Limit sweets, desserts, sugars, and sugary drinks. °· Choose heart-healthy fats. °· Limit cheese to 1 oz (28 g) per day. °· Eat more home-cooked food and less restaurant, buffet, and fast food. °· Limit fried foods. °· Cook foods using methods other than frying. °· Limit canned vegetables. If you do use them, rinse them well to decrease the sodium. °· When eating at a restaurant, ask that your food be prepared with less salt, or no salt if possible. °WHAT FOODS CAN I EAT? °Seek help from a dietitian for individual calorie needs. °Grains °Whole grain or whole wheat bread. Brown rice. Whole grain or whole wheat pasta. Quinoa, bulgur, and whole grain cereals. Low-sodium cereals. Corn or whole wheat flour tortillas. Whole grain cornbread. Whole grain crackers. Low-sodium crackers. °Vegetables °Fresh or frozen vegetables  (raw, steamed, roasted, or grilled). Low-sodium or reduced-sodium tomato and vegetable juices. Low-sodium or reduced-sodium tomato sauce and paste. Low-sodium or reduced-sodium canned vegetables.  °Fruits °All fresh, canned (in natural juice), or frozen fruits. °Meat and Other Protein Products °Ground beef (85% or leaner), grass-fed beef, or beef trimmed of fat. Skinless chicken or turkey. Ground chicken or turkey. Pork trimmed of fat. All fish and seafood. Eggs. Dried beans, peas, or lentils. Unsalted nuts and seeds. Unsalted canned beans. °Dairy °Low-fat dairy products, such as skim or 1% milk, 2% or reduced-fat cheeses, low-fat ricotta or cottage cheese, or plain low-fat yogurt. Low-sodium or reduced-sodium cheeses. °Fats and Oils °Tub margarines without trans fats. Light or reduced-fat mayonnaise and salad dressings (reduced sodium). Avocado. Safflower, olive, or canola oils. Natural peanut or almond butter. °Other °Unsalted popcorn and pretzels. °The items listed above may not be a complete list of recommended foods or beverages. Contact your dietitian for more options. °WHAT FOODS ARE NOT RECOMMENDED? °Grains °White bread. White pasta. White rice. Refined cornbread. Bagels and croissants. Crackers that contain trans fat. °Vegetables °Creamed or fried vegetables. Vegetables in a cheese sauce. Regular canned vegetables. Regular canned tomato sauce and paste. Regular tomato and vegetable juices. °Fruits °Dried fruits. Canned fruit in light or heavy syrup. Fruit juice. °Meat and Other Protein Products °Fatty cuts of meat. Ribs, chicken wings, bacon, sausage, bologna, salami, chitterlings, fatback, hot dogs, bratwurst, and packaged luncheon meats. Salted nuts and seeds. Canned beans with salt. °Dairy °Whole or 2% milk, cream, half-and-half, and cream cheese. Whole-fat or sweetened yogurt. Full-fat   cheeses or blue cheese. Nondairy creamers and whipped toppings. Processed cheese, cheese spreads, or cheese  curds. °Condiments °Onion and garlic salt, seasoned salt, table salt, and sea salt. Canned and packaged gravies. Worcestershire sauce. Tartar sauce. Barbecue sauce. Teriyaki sauce. Soy sauce, including reduced sodium. Steak sauce. Fish sauce. Oyster sauce. Cocktail sauce. Horseradish. Ketchup and mustard. Meat flavorings and tenderizers. Bouillon cubes. Hot sauce. Tabasco sauce. Marinades. Taco seasonings. Relishes. °Fats and Oils °Butter, stick margarine, lard, shortening, ghee, and bacon fat. Coconut, palm kernel, or palm oils. Regular salad dressings. °Other °Pickles and olives. Salted popcorn and pretzels. °The items listed above may not be a complete list of foods and beverages to avoid. Contact your dietitian for more information. °WHERE CAN I FIND MORE INFORMATION? °National [employer], [employer], [employer]: www.nhlbi.nih.gov/health/health-topics/topics/dash/ °  °This information is not intended to replace advice given to you by your health care provider. Make sure you discuss any questions you have with your health care provider. °  °Document Released: 07/12/2011 Document Revised: 08/13/2014 Document Reviewed: 05/27/2013 °Elsevier Interactive Patient Education ©2016 Elsevier Inc. ° °Hypertension °Hypertension, commonly called high blood pressure, is when the force of blood pumping through your arteries is too strong. Your arteries are the blood vessels that carry blood from your heart throughout your body. A blood pressure reading consists of a higher number over a lower number, such as 110/72. The higher number (systolic) is the pressure inside your arteries when your heart pumps. The lower number (diastolic) is the pressure inside your arteries when your heart relaxes. Ideally you want your blood pressure below 120/80. °Hypertension forces your heart to work harder to pump blood. Your arteries may become narrow or stiff. Having untreated or uncontrolled hypertension can cause heart attack, stroke, kidney  disease, and other problems. °RISK FACTORS °Some risk factors for high blood pressure are controllable. Others are not.  °Risk factors you cannot control include:  °· Race. You may be at higher risk if you are African American. °· Age. Risk increases with age. °· Gender. Men are at higher risk than women before age 45 years. After age 65, women are at higher risk than men. °Risk factors you can control include: °· Not getting enough exercise or physical activity. °· Being overweight. °· Getting too much fat, sugar, calories, or salt in your diet. °· Drinking too much alcohol. °SIGNS AND SYMPTOMS °Hypertension does not usually cause signs or symptoms. Extremely high blood pressure (hypertensive crisis) may cause headache, anxiety, shortness of breath, and nosebleed. °DIAGNOSIS °To check if you have hypertension, your health care provider will measure your blood pressure while you are seated, with your arm held at the level of your heart. It should be measured at least twice using the same arm. Certain conditions can cause a difference in blood pressure between your right and left arms. A blood pressure reading that is higher than normal on one occasion does not mean that you need treatment. If it is not clear whether you have high blood pressure, you may be asked to return on a different day to have your blood pressure checked again. Or, you may be asked to monitor your blood pressure at home for 1 or more weeks. °TREATMENT °Treating high blood pressure includes making lifestyle changes and possibly taking medicine. Living a healthy lifestyle can help lower high blood pressure. You may need to change some of your habits. °Lifestyle changes may include: °· Following the DASH diet. This diet is high in fruits, vegetables, and whole   grains. It is low in salt, red meat, and added sugars. °· Keep your sodium intake below 2,300 mg per day. °· Getting at least 30-45 minutes of aerobic exercise at least 4 times per  week. °· Losing weight if necessary. °· Not smoking. °· Limiting alcoholic beverages. °· Learning ways to reduce stress. °Your health care provider may prescribe medicine if lifestyle changes are not enough to get your blood pressure under control, and if one of the following is true: °· You are 18-59 years of age and your systolic blood pressure is above 140. °· You are 60 years of age or older, and your systolic blood pressure is above 150. °· Your diastolic blood pressure is above 90. °· You have diabetes, and your systolic blood pressure is over 140 or your diastolic blood pressure is over 90. °· You have kidney disease and your blood pressure is above 140/90. °· You have heart disease and your blood pressure is above 140/90. °Your personal target blood pressure may vary depending on your medical conditions, your age, and other factors. °HOME CARE INSTRUCTIONS °· Have your blood pressure rechecked as directed by your health care provider.   °· Take medicines only as directed by your health care provider. Follow the directions carefully. Blood pressure medicines must be taken as prescribed. The medicine does not work as well when you skip doses. Skipping doses also puts you at risk for problems. °· Do not smoke.   °· Monitor your blood pressure at home as directed by your health care provider.  °SEEK MEDICAL CARE IF:  °· You think you are having a reaction to medicines taken. °· You have recurrent headaches or feel dizzy. °· You have swelling in your ankles. °· You have trouble with your vision. °SEEK IMMEDIATE MEDICAL CARE IF: °· You develop a severe headache or confusion. °· You have unusual weakness, numbness, or feel faint. °· You have severe chest or abdominal pain. °· You vomit repeatedly. °· You have trouble breathing. °MAKE SURE YOU:  °· Understand these instructions. °· Will watch your condition. °· Will get help right away if you are not doing well or get worse. °  °This information is not intended to  replace advice given to you by your health care provider. Make sure you discuss any questions you have with your health care provider. °  °Document Released: 07/23/2005 Document Revised: 12/07/2014 Document Reviewed: 05/15/2013 °Elsevier Interactive Patient Education ©2016 Elsevier Inc. ° °

## 2015-11-22 NOTE — Progress Notes (Signed)
Subjective:    Patient ID: Kelli Walls, female    DOB: 10-08-1970, 45 y.o.   MRN: [id number]  Pt presents to the office today for HTN. PT's BP is 164/103.  She has not been taking her Toprol or Norvasc but has been sporadically taking her Losartan/HCTZ 100-25 mg daily.  Last refil was 09/28/15 and there are still 9 pills in the bottle.  Medication adherence encouraged to patient and mother. She is due for an eye exam and her mother is going to make an appointment today.  Suggestions made to obtain cell phone apps and pill boxes to help remind her to take her medications and monitor her blood pressure at home. Both seem responsive to the suggestions.  She has not been seeing a psychologist for depression and feels that it is not a problem at the present time.    Hypertension This is a chronic problem. The current episode started more than 1 year ago. The problem is unchanged. The problem is uncontrolled. Pertinent negatives include no anxiety, chest pain, headaches, malaise/fatigue, palpitations, peripheral edema or shortness of breath. Risk factors for coronary artery disease include dyslipidemia, family history and obesity. Past treatments include angiotensin blockers and diuretics. The current treatment provides significant improvement. Compliance problems include medication cost, medication side effects, diet and psychosocial issues.  There is no history of kidney disease, CAD/MI, CVA, heart failure or a thyroid problem.  Depression        This is a chronic problem.  The current episode started more than 1 year ago.   The onset quality is gradual.   The problem occurs rarely.  The problem has been waxing and waning since onset.  Associated symptoms include restlessness and sad.  Associated symptoms include no fatigue, no helplessness, no hopelessness, no decreased interest, no headaches and no suicidal ideas.  Past treatments include nothing.  Compliance with treatment is variable.   Pertinent negatives  include no thyroid problem and no anxiety. Hyperlipidemia This is a chronic problem. The current episode started more than 1 year ago. The problem is uncontrolled. Recent lipid tests were reviewed and are high. Pertinent negatives include no chest pain or shortness of breath. Current antihyperlipidemic treatment includes diet change. The current treatment provides no improvement of lipids. Risk factors for coronary artery disease include dyslipidemia, hypertension and a sedentary lifestyle.  Migraine  This is a chronic problem. The current episode started more than 1 year ago. The problem occurs intermittently. The problem has been waxing and waning. The pain is located in the frontal region. The pain does not radiate. The pain quality is similar to prior headaches. The quality of the pain is described as aching. The pain is at a severity of 5/10. The pain is moderate. Associated symptoms include phonophobia and photophobia. Pertinent negatives include no ear pain, eye pain, hearing loss, loss of balance or weakness. The symptoms are aggravated by emotional stress. She has tried triptans for the symptoms. The treatment provided moderate relief. Her past medical history is significant for hypertension and migraine headaches.      Review of Systems  Constitutional: Negative for malaise/fatigue, activity change, fatigue and unexpected weight change.  HENT: Negative.  Negative for ear pain and hearing loss.   Eyes: Positive for photophobia. Negative for pain.  Respiratory: Negative.  Negative for chest tightness and shortness of breath.   Cardiovascular: Negative.  Negative for chest pain, palpitations and leg swelling.  Endocrine: Negative.   Genitourinary: Negative.   Musculoskeletal: Negative.  Allergic/Immunologic: Negative.   Neurological: Negative.  Negative for weakness, headaches and loss of balance.  Hematological: Negative.   Psychiatric/Behavioral: Positive for depression. Negative for  suicidal ideas.  All other systems reviewed and are negative.      Objective:   Physical Exam  Constitutional: She is oriented to person, place, and time. She appears well-developed and well-nourished. No distress.  HENT:  Head: Normocephalic and atraumatic.  Right Ear: Tympanic membrane and external ear normal.  Left Ear: Tympanic membrane normal.  Nose: No mucosal edema.  Mouth/Throat: Oropharynx is clear and moist.  Eyes: Conjunctivae and EOM are normal. Pupils are equal, round, and reactive to light.  Neck: Normal range of motion. Neck supple. Carotid bruit is not present. No thyroid mass and no thyromegaly present.  Cardiovascular: Normal rate, regular rhythm, normal heart sounds and intact distal pulses.   No murmur heard. Pulses:      Radial pulses are 2+ on the right side, and 2+ on the left side.       Posterior tibial pulses are 2+ on the right side, and 2+ on the left side.  Pulmonary/Chest: Effort normal and breath sounds normal. No respiratory distress. She has no wheezes.  Abdominal: Soft. Bowel sounds are normal. She exhibits no distension. There is no tenderness.  Musculoskeletal: Normal range of motion. She exhibits no edema or tenderness.  Lymphadenopathy:    She has no cervical adenopathy.  Neurological: She is alert and oriented to person, place, and time. She has normal reflexes. No cranial nerve deficit. Coordination normal.  Right eye mild ptosis since childhood  Skin: Skin is warm and dry.  Psychiatric: She has a normal mood and affect. Her behavior is normal. Judgment and thought content normal. Cognition and memory are impaired.  Vitals reviewed.     BP 163/100 mmHg  Pulse 80  Temp(Src) 97.3 F (36.3 C) (Oral)  Ht 5' 2"  (1.575 m)  Wt 232 lb 9.6 oz (105.507 kg)  BMI 42.53 kg/m2     Assessment & Plan:  1. Essential hypertension -PT's medication reordered!! Long discussion with patient on the importance of taking medications everyday!!!! -RTO in 2  weeks afer taking medications everyday!! - losartan-hydrochlorothiazide (HYZAAR) 100-25 MG tablet; Take 1 tablet by mouth daily.  Dispense: 30 tablet; Refill: 11 - metoprolol succinate (TOPROL-XL) 100 MG 24 hr tablet; Take 1 tablet (100 mg total) by mouth daily. Take with or immediately following a meal.  Dispense: 90 tablet; Refill: 3 - ONG29+BMWU  2. Metabolic syndrome - XLK44+WNUU  3. Morbid obesity, unspecified obesity type ([place]) - CMP14+EGFR  4. Moderate episode of recurrent major depressive disorder ([place]) -Celexa reordered for patient to start - citalopram (CELEXA) 10 MG tablet; Take 1 tablet (10 mg total) by mouth daily.  Dispense: 30 tablet; Refill: 0 - CMP14+EGFR  5. Hypokalemia - CMP14+EGFR  6. Essential hypertension, benign  7. Migraine without aura and without status migrainosus, not intractable - CMP14+EGFR  8. Hyperlipidemia - atorvastatin (LIPITOR) 40 MG tablet; Take 1 tablet (40 mg total) by mouth daily.  Dispense: 90 tablet; Refill: 3 - CMP14+EGFR - Lipid panel   Continue all meds Labs pending Health Maintenance reviewed Diet and exercise encouraged RTO 2  Evelina Dun, FNP

## 2015-11-23 LAB — CMP14+EGFR
A/G RATIO: 1.3 (ref 1.2–2.2)
ALBUMIN: 4.1 g/dL (ref 3.5–5.5)
ALT: 16 IU/L (ref 0–32)
AST: 14 IU/L (ref 0–40)
Alkaline Phosphatase: 47 IU/L (ref 39–117)
BUN / CREAT RATIO: 12 (ref 9–23)
BUN: 13 mg/dL (ref 6–24)
Bilirubin Total: 0.3 mg/dL (ref 0.0–1.2)
CALCIUM: 9.1 mg/dL (ref 8.7–10.2)
CO2: 23 mmol/L (ref 18–29)
CREATININE: 1.09 mg/dL — AB (ref 0.57–1.00)
Chloride: 98 mmol/L (ref 96–106)
GFR calc Af Amer: 72 mL/min/{1.73_m2} (ref >59)
GFR, EST NON AFRICAN AMERICAN: 63 mL/min/{1.73_m2} (ref >59)
GLOBULIN, TOTAL: 3.2 g/dL (ref 1.5–4.5)
Glucose: 83 mg/dL (ref 65–99)
POTASSIUM: 4 mmol/L (ref 3.5–5.2)
SODIUM: 139 mmol/L (ref 134–144)
Total Protein: 7.3 g/dL (ref 6.0–8.5)

## 2015-11-23 LAB — LIPID PANEL
CHOL/HDL RATIO: 4.7 ratio — AB (ref 0.0–4.4)
Cholesterol, Total: 239 mg/dL — ABNORMAL HIGH (ref 100–199)
HDL: 51 mg/dL (ref >39)
LDL CALC: 162 mg/dL — AB (ref 0–99)
Triglycerides: 129 mg/dL (ref 0–149)
VLDL Cholesterol Cal: 26 mg/dL (ref 5–40)

## 2015-12-12 ENCOUNTER — Encounter (INDEPENDENT_AMBULATORY_CARE_PROVIDER_SITE_OTHER): Payer: Self-pay

## 2015-12-12 ENCOUNTER — Ambulatory Visit (INDEPENDENT_AMBULATORY_CARE_PROVIDER_SITE_OTHER): Payer: Medicaid Other | Admitting: Family

## 2015-12-12 ENCOUNTER — Encounter: Payer: Self-pay | Admitting: Family

## 2015-12-12 VITALS — BP 121/73 | HR 68 | Temp 98.4°F | Ht 62.0 in | Wt 227.0 lb

## 2015-12-12 DIAGNOSIS — I10 Essential (primary) hypertension: Secondary | ICD-10-CM

## 2015-12-12 NOTE — Progress Notes (Signed)
   Subjective:    Patient ID: Kelli Walls, female    DOB: 07-20-1971, 45 y.o.   MRN: [id number]  Pt presents to the office today to recheck HTN. PT's BP is at goal today. Our last visit, she had not been taking all of her medications as she should have been. PT reports taking all of he medications and doing well.  Hypertension This is a chronic problem. The current episode started more than 1 year ago. The problem has been resolved since onset. The problem is controlled. Pertinent negatives include no anxiety, headaches, palpitations, peripheral edema or shortness of breath. Risk factors for coronary artery disease include dyslipidemia, obesity and sedentary lifestyle. Past treatments include angiotensin blockers, diuretics, beta blockers and calcium channel blockers. The current treatment provides moderate improvement. There is no history of kidney disease, CAD/MI, CVA, heart failure or a thyroid problem. There is no history of sleep apnea.      Review of Systems  Respiratory: Negative for shortness of breath.   Cardiovascular: Negative for palpitations.  Neurological: Negative for headaches.  All other systems reviewed and are negative.      Objective:   Physical Exam  Constitutional: She is oriented to person, place, and time. She appears well-developed and well-nourished. No distress.  HENT:  Head: Normocephalic and atraumatic.  Eyes: Pupils are equal, round, and reactive to light.  Cardiovascular: Normal rate, regular rhythm, normal heart sounds and intact distal pulses.   No murmur heard. Pulmonary/Chest: Effort normal and breath sounds normal. No respiratory distress. She has no wheezes.  Abdominal: Soft. Bowel sounds are normal. She exhibits no distension. There is no tenderness.  Musculoskeletal: Normal range of motion. She exhibits no edema or tenderness.  Neurological: She is alert and oriented to person, place, and time.  Skin: Skin is warm and dry.  Psychiatric: She has a  normal mood and affect. Her behavior is normal. Judgment and thought content normal.  Vitals reviewed.   BP 121/73 mmHg  Pulse 68  Temp(Src) 98.4 F (36.9 C) (Oral)  Ht _0  (1.575 m)  Wt 227 lb (102.967 kg)  BMI 41.51 kg/m2       Assessment & Plan:  1. Essential hypertension, benign -Dash diet information given -Exercise encouraged - Stress Management  -Continue current meds -RTO in 3 months - BMP8+EGFR  Evelina Dun, FNP

## 2015-12-12 NOTE — Patient Instructions (Signed)
Hypertension Hypertension, commonly called high blood pressure, is when the force of blood pumping through your arteries is too strong. Your arteries are the blood vessels that carry blood from your heart throughout your body. A blood pressure reading consists of a higher number over a lower number, such as 110/72. The higher number (systolic) is the pressure inside your arteries when your heart pumps. The lower number (diastolic) is the pressure inside your arteries when your heart relaxes. Ideally you want your blood pressure below 120/80. Hypertension forces your heart to work harder to pump blood. Your arteries may become narrow or stiff. Having untreated or uncontrolled hypertension can cause heart attack, stroke, kidney disease, and other problems. RISK FACTORS Some risk factors for high blood pressure are controllable. Others are not.  Risk factors you cannot control include:   Race. You may be at higher risk if you are African American.  Age. Risk increases with age.  Gender. Men are at higher risk than women before age 45 years. After age 65, women are at higher risk than men. Risk factors you can control include:  Not getting enough exercise or physical activity.  Being overweight.  Getting too much fat, sugar, calories, or salt in your diet.  Drinking too much alcohol. SIGNS AND SYMPTOMS Hypertension does not usually cause signs or symptoms. Extremely high blood pressure (hypertensive crisis) may cause headache, anxiety, shortness of breath, and nosebleed. DIAGNOSIS To check if you have hypertension, your health care provider will measure your blood pressure while you are seated, with your arm held at the level of your heart. It should be measured at least twice using the same arm. Certain conditions can cause a difference in blood pressure between your right and left arms. A blood pressure reading that is higher than normal on one occasion does not mean that you need treatment. If  it is not clear whether you have high blood pressure, you may be asked to return on a different day to have your blood pressure checked again. Or, you may be asked to monitor your blood pressure at home for 1 or more weeks. TREATMENT Treating high blood pressure includes making lifestyle changes and possibly taking medicine. Living a healthy lifestyle can help lower high blood pressure. You may need to change some of your habits. Lifestyle changes may include:  Following the DASH diet. This diet is high in fruits, vegetables, and whole grains. It is low in salt, red meat, and added sugars.  Keep your sodium intake below 2,300 mg per day.  Getting at least 30-45 minutes of aerobic exercise at least 4 times per week.  Losing weight if necessary.  Not smoking.  Limiting alcoholic beverages.  Learning ways to reduce stress. Your health care provider may prescribe medicine if lifestyle changes are not enough to get your blood pressure under control, and if one of the following is true:  You are 18-59 years of age and your systolic blood pressure is above 140.  You are 60 years of age or older, and your systolic blood pressure is above 150.  Your diastolic blood pressure is above 90.  You have diabetes, and your systolic blood pressure is over 140 or your diastolic blood pressure is over 90.  You have kidney disease and your blood pressure is above 140/90.  You have heart disease and your blood pressure is above 140/90. Your personal target blood pressure may vary depending on your medical conditions, your age, and other factors. HOME CARE INSTRUCTIONS    Have your blood pressure rechecked as directed by your health care provider.   Take medicines only as directed by your health care provider. Follow the directions carefully. Blood pressure medicines must be taken as prescribed. The medicine does not work as well when you skip doses. Skipping doses also puts you at risk for  problems.  Do not smoke.   Monitor your blood pressure at home as directed by your health care provider. SEEK MEDICAL CARE IF:   You think you are having a reaction to medicines taken.  You have recurrent headaches or feel dizzy.  You have swelling in your ankles.  You have trouble with your vision. SEEK IMMEDIATE MEDICAL CARE IF:  You develop a severe headache or confusion.  You have unusual weakness, numbness, or feel faint.  You have severe chest or abdominal pain.  You vomit repeatedly.  You have trouble breathing. MAKE SURE YOU:   Understand these instructions.  Will watch your condition.  Will get help right away if you are not doing well or get worse.   This information is not intended to replace advice given to you by your health care provider. Make sure you discuss any questions you have with your health care provider.   Document Released: 07/23/2005 Document Revised: 12/07/2014 Document Reviewed: 05/15/2013 Elsevier Interactive Patient Education 2016 [employer]  

## 2015-12-13 LAB — BMP8+EGFR
BUN/Creatinine Ratio: 14 (ref 9–23)
BUN: 17 mg/dL (ref 6–24)
CALCIUM: 9.5 mg/dL (ref 8.7–10.2)
CO2: 27 mmol/L (ref 18–29)
Chloride: 101 mmol/L (ref 96–106)
Creatinine, Ser: 1.2 mg/dL — ABNORMAL HIGH (ref 0.57–1.00)
GFR, EST AFRICAN AMERICAN: 64 mL/min/{1.73_m2} (ref >59)
GFR, EST NON AFRICAN AMERICAN: 55 mL/min/{1.73_m2} — AB (ref >59)
Glucose: 84 mg/dL (ref 65–99)
Potassium: 3.6 mmol/L (ref 3.5–5.2)
Sodium: 143 mmol/L (ref 134–144)

## 2015-12-18 ENCOUNTER — Other Ambulatory Visit: Payer: Self-pay | Admitting: Family Medicine

## 2015-12-19 NOTE — Telephone Encounter (Signed)
Last seen 12/12/15  Kelli Walls

## 2016-03-13 ENCOUNTER — Ambulatory Visit: Payer: Medicaid Other | Admitting: Family

## 2016-03-14 ENCOUNTER — Encounter: Payer: Self-pay | Admitting: Family

## 2016-03-16 IMAGING — CR DG ANKLE COMPLETE 3+V*L*
3 series · 3 of 3 positions shown · non-contrast
Comparison: None.

CLINICAL DATA: 44-year-old who sustained a twisting injury to the
left ankle and complains of diffuse pain and swelling. Initial
encounter.

EXAM:
LEFT ANKLE COMPLETE - 3+ VIEW

[view not recorded (1 of 3)]
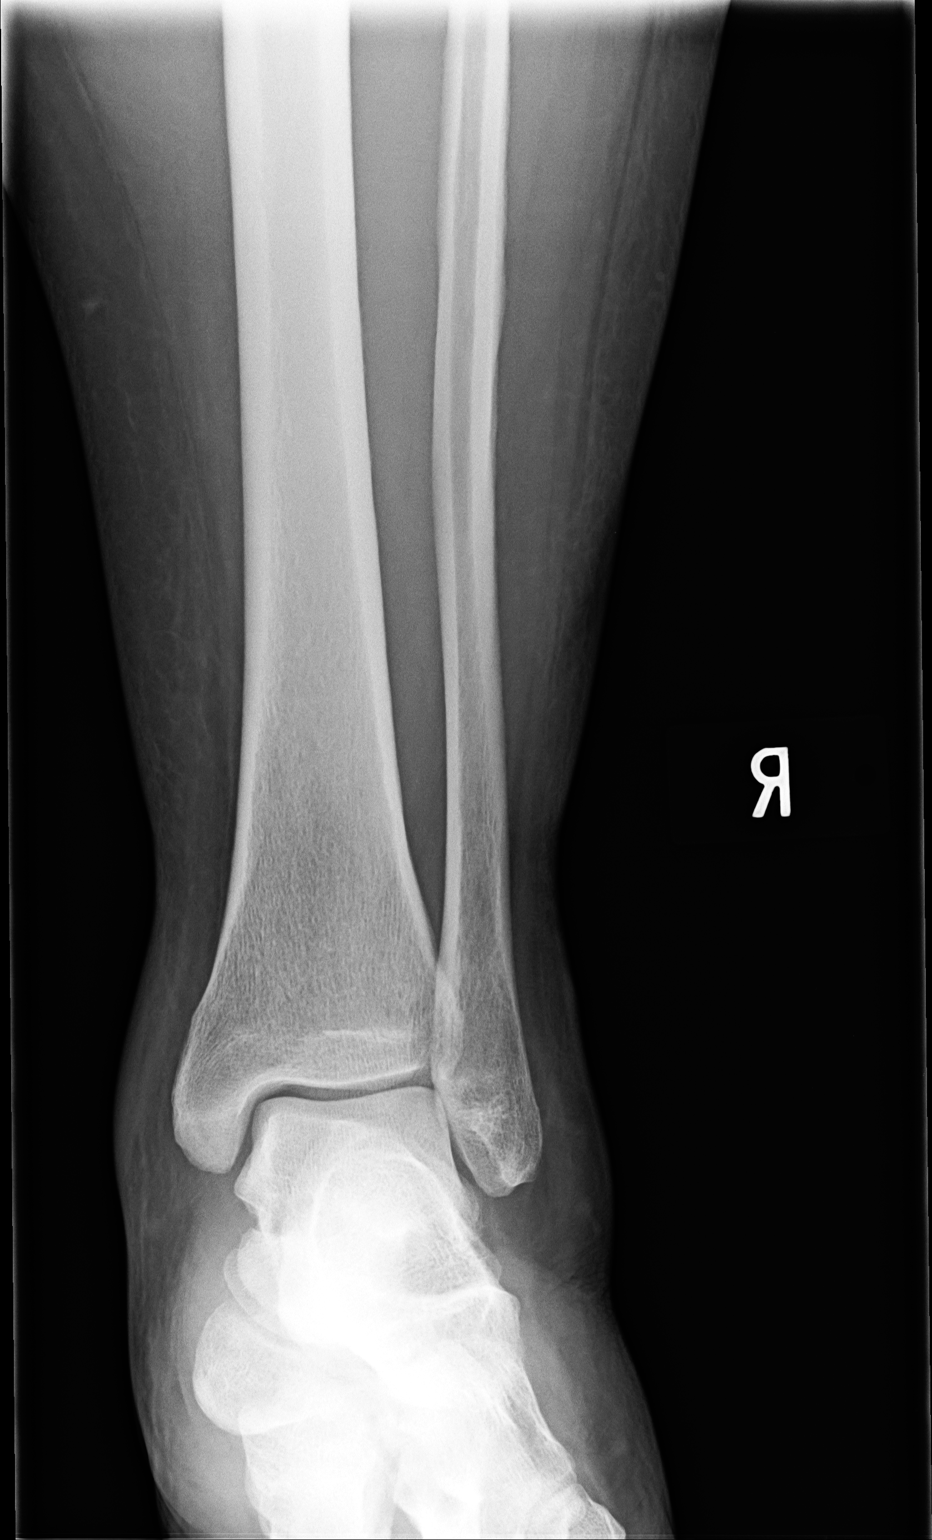

[view not recorded (2 of 3)]
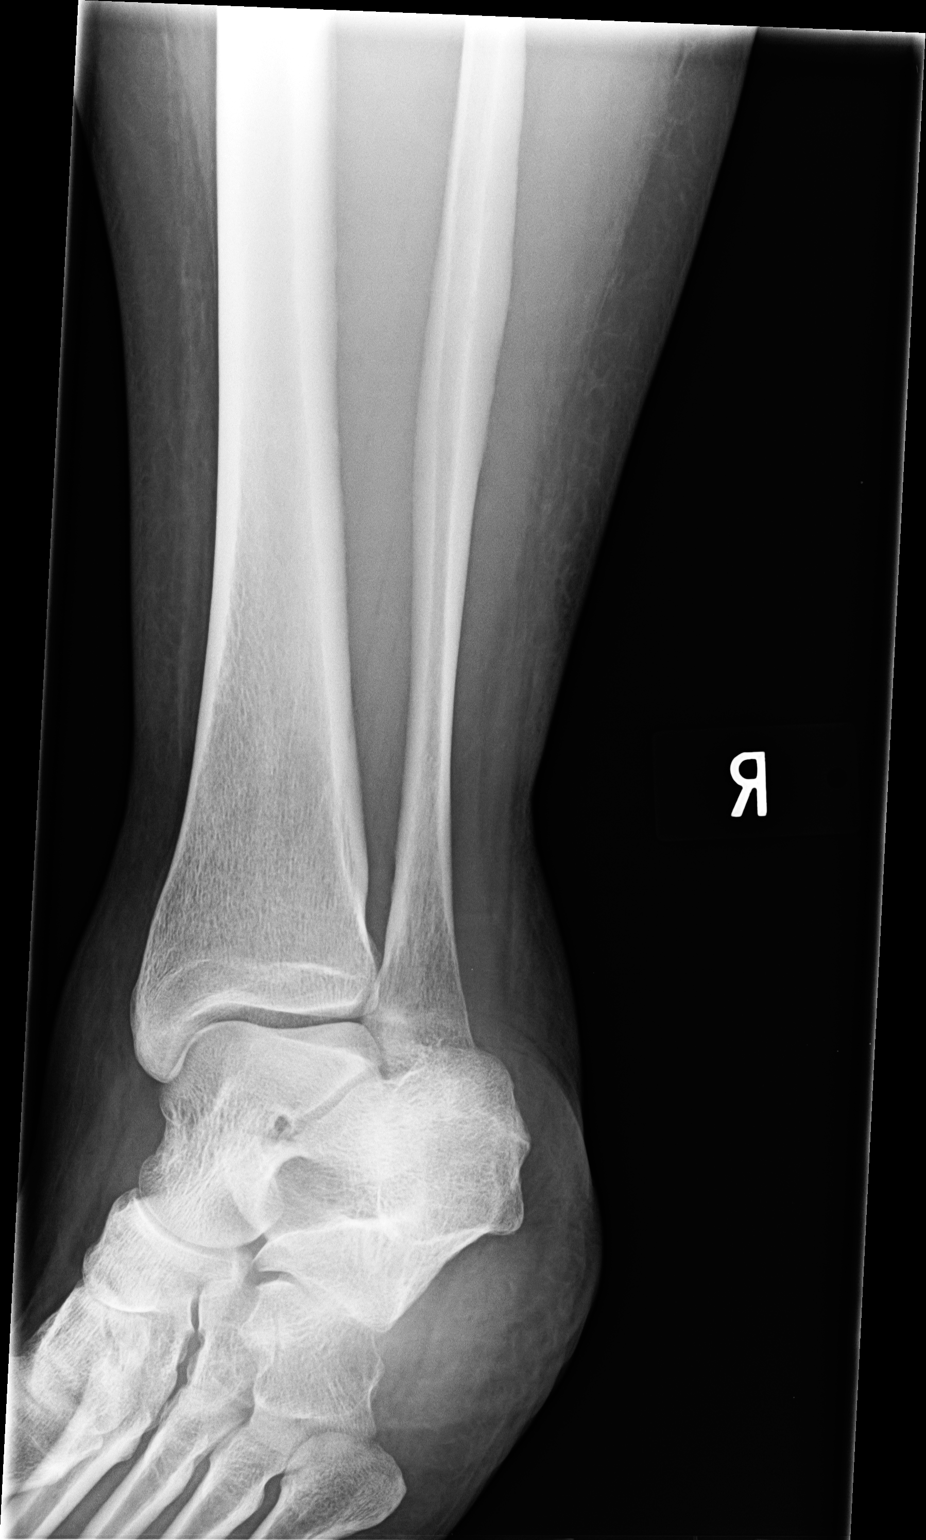

[view not recorded (3 of 3)]
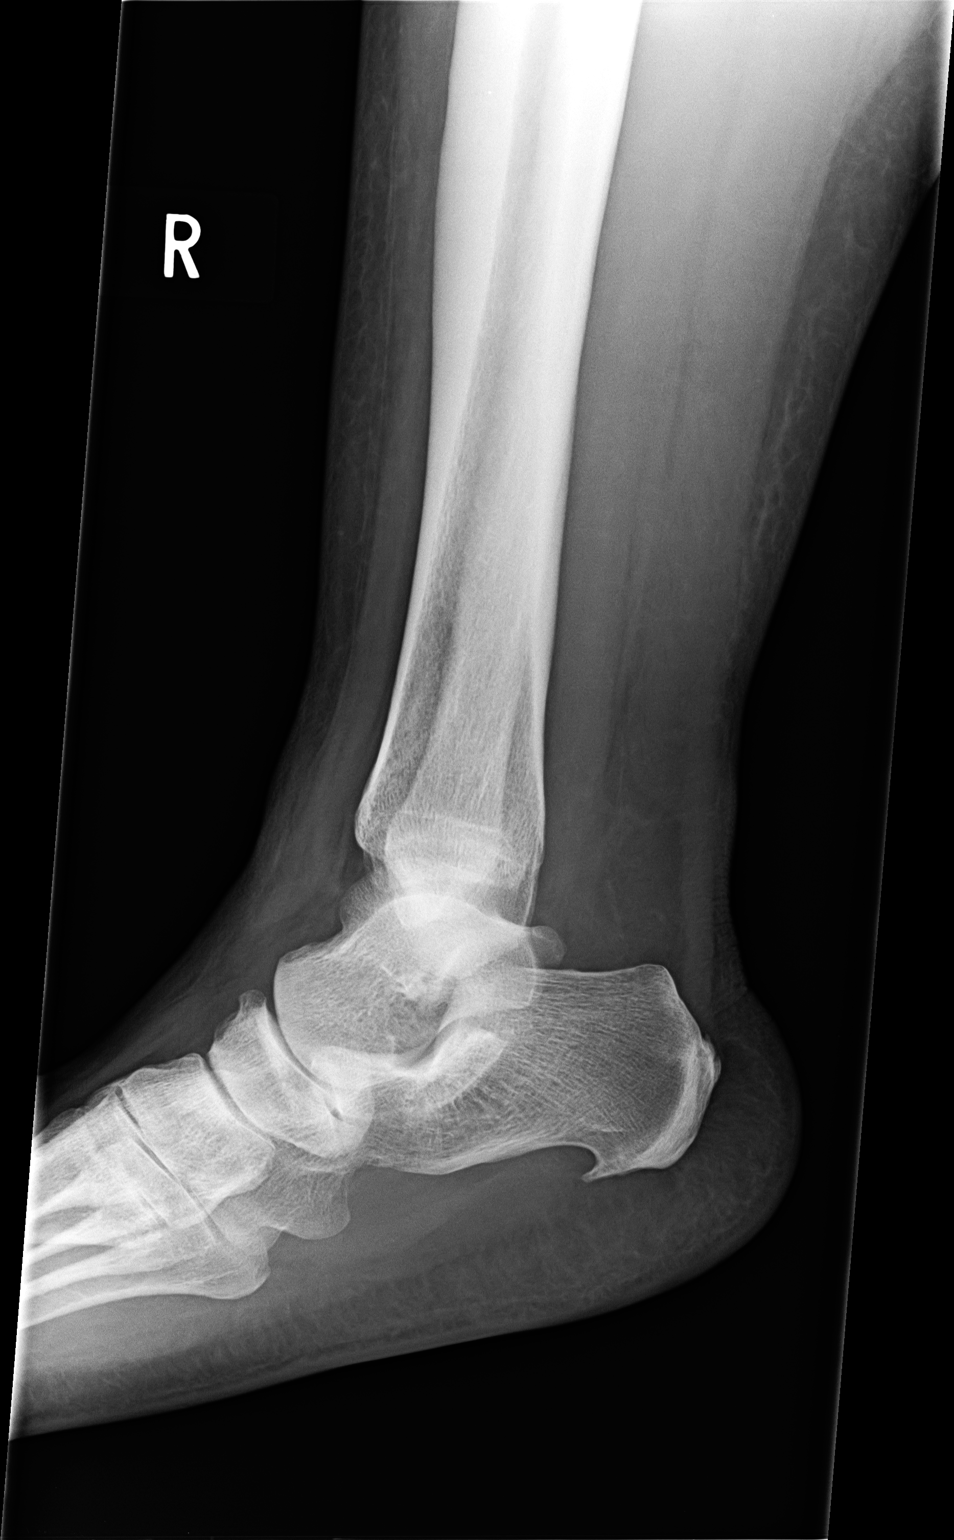

[3 of 3 positions shown; findings below may reference images not displayed]

FINDINGS: Diffuse soft tissue swelling. No evidence of acute fracture or
dislocation. Ankle mortise intact with well preserved joint space.
Bone mineral density well-preserved. No visible joint effusion.
Moderate-sized plantar calcaneal spur.
IMPRESSION: No acute osseous abnormality.

## 2016-04-10 ENCOUNTER — Telehealth: Payer: Self-pay | Admitting: Family

## 2016-04-16 ENCOUNTER — Ambulatory Visit (INDEPENDENT_AMBULATORY_CARE_PROVIDER_SITE_OTHER): Payer: Medicaid Other | Admitting: Family

## 2016-04-16 ENCOUNTER — Encounter: Payer: Self-pay | Admitting: Family

## 2016-04-16 VITALS — BP 157/113 | HR 81 | Temp 98.4°F | Ht 62.0 in | Wt 236.8 lb

## 2016-04-16 DIAGNOSIS — E785 Hyperlipidemia, unspecified: Secondary | ICD-10-CM | POA: Diagnosis not present

## 2016-04-16 DIAGNOSIS — I10 Essential (primary) hypertension: Secondary | ICD-10-CM

## 2016-04-16 DIAGNOSIS — E8881 Metabolic syndrome: Secondary | ICD-10-CM | POA: Diagnosis not present

## 2016-04-16 DIAGNOSIS — F331 Major depressive disorder, recurrent, moderate: Secondary | ICD-10-CM

## 2016-04-16 DIAGNOSIS — G43009 Migraine without aura, not intractable, without status migrainosus: Secondary | ICD-10-CM

## 2016-04-16 MED ORDER — CITALOPRAM HYDROBROMIDE 10 MG PO TABS: 10.0000 mg | ORAL_TABLET | Freq: Every day | ORAL | 0 refills | Status: DC

## 2016-04-16 NOTE — Progress Notes (Signed)
Subjective:    Patient ID: Kelli Walls, female    DOB: 02/15/1971, 45 y.o.   MRN: [id number]  Pt presents to the office today for chronic follow up.  Hypertension  This is a chronic problem. The current episode started more than 1 year ago. The problem has been waxing and waning since onset. The problem is uncontrolled. Pertinent negatives include no anxiety, headaches, palpitations, peripheral edema or shortness of breath. Risk factors for coronary artery disease include family history, obesity, dyslipidemia and sedentary lifestyle. Past treatments include calcium channel blockers, diuretics and angiotensin blockers. The current treatment provides mild improvement. There is no history of kidney disease, CAD/MI, CVA, heart failure or a thyroid problem.  Hyperlipidemia  This is a chronic problem. The current episode started more than 1 year ago. The problem is uncontrolled. Recent lipid tests were reviewed and are high. Exacerbating diseases include obesity. She has no history of diabetes. Pertinent negatives include no leg pain or shortness of breath. Current antihyperlipidemic treatment includes statins. The current treatment provides moderate improvement of lipids. Risk factors for coronary artery disease include dyslipidemia, hypertension, obesity and family history.  Depression       The patient presents with depression.  This is a chronic problem.  The current episode started more than 1 year ago.   The onset quality is gradual.   The problem occurs rarely.  Associated symptoms include hopelessness, irritable, decreased interest and sad.  Associated symptoms include no helplessness, no restlessness, no headaches and no suicidal ideas.  Past treatments include nothing.  Past medical history includes depression.     Pertinent negatives include no thyroid problem and no anxiety. Migraine   This is a chronic problem. The current episode started more than 1 year ago. The problem occurs intermittently  (once month). The problem has been waxing and waning. The pain is at a severity of 1/10. The pain is mild. Her past medical history is significant for hypertension and obesity.      Review of Systems  Constitutional: Negative.   HENT: Negative.   Eyes: Negative.   Respiratory: Negative.  Negative for shortness of breath.   Cardiovascular: Negative.  Negative for palpitations.  Gastrointestinal: Negative.   Endocrine: Negative.   Genitourinary: Negative.   Musculoskeletal: Negative.   Neurological: Negative.  Negative for headaches.  Hematological: Negative.   Psychiatric/Behavioral: Positive for depression. Negative for suicidal ideas.  All other systems reviewed and are negative.      Objective:   Physical Exam  Constitutional: She is oriented to person, place, and time. She appears well-developed and well-nourished. She is irritable. No distress.  Morbid obesity   HENT:  Head: Normocephalic and atraumatic.  Right Ear: External ear normal.  Left Ear: External ear normal.  Nose: Nose normal.  Mouth/Throat: Oropharynx is clear and moist.  Eyes: Pupils are equal, round, and reactive to light.  Neck: Normal range of motion. Neck supple. No thyromegaly present.  Cardiovascular: Normal rate, regular rhythm, normal heart sounds and intact distal pulses.   No murmur heard. Pulmonary/Chest: Effort normal and breath sounds normal. No respiratory distress. She has no wheezes.  Abdominal: Soft. Bowel sounds are normal. She exhibits no distension. There is no tenderness.  Musculoskeletal: Normal range of motion. She exhibits no edema or tenderness.  Neurological: She is alert and oriented to person, place, and time. She has normal reflexes. No cranial nerve deficit.  Skin: Skin is warm and dry.  Psychiatric: She has a normal mood and affect. Her  behavior is normal. Judgment and thought content normal.  Vitals reviewed.   BP (!) 157/113   Pulse 81   Temp 98.4 F (36.9 C) (Oral)    Ht 5' 2"  (1.575 m)   Wt 236 lb 12.8 oz (107.4 kg)   BMI 43.31 kg/m        Assessment & Plan:  1. Essential hypertension -Pt has not taken any of her hypertensive medications today. Pt told to take medications and follow up in 1 week. IF BP elevated at that visit we will add Cardura. Pt has not taken medications as prescribed in past.  - CMP14+EGFR  2. Morbid obesity, unspecified obesity type ([place]) - CMP14+EGFR  3. Hyperlipemi - CMP14+EGFR - Lipid panel  4. Migraine without aura and without status migrainosus, not intractable - CZY60+YTKZ  5. Metabolic syndrome - SWF09+NATF  6. Moderate episode of recurrent major depressive disorder ([place]) -Reordered celexa today. Pt states she never started rx List of local psychologists given patient to make appt - CMP14+EGFR - citalopram (CELEXA) 10 MG tablet; Take 1 tablet (10 mg total) by mouth daily.  Dispense: 30 tablet; Refill: 0   Continue all meds Labs pending Health Maintenance reviewed Diet and exercise encouraged RTO 1 week  Evelina Dun, FNP

## 2016-04-16 NOTE — Patient Instructions (Signed)
Hypertension Hypertension, commonly called high blood pressure, is when the force of blood pumping through your arteries is too strong. Your arteries are the blood vessels that carry blood from your heart throughout your body. A blood pressure reading consists of a higher number over a lower number, such as 110/72. The higher number (systolic) is the pressure inside your arteries when your heart pumps. The lower number (diastolic) is the pressure inside your arteries when your heart relaxes. Ideally you want your blood pressure below 120/80. Hypertension forces your heart to work harder to pump blood. Your arteries may become narrow or stiff. Having untreated or uncontrolled hypertension can cause heart attack, stroke, kidney disease, and other problems. RISK FACTORS Some risk factors for high blood pressure are controllable. Others are not.  Risk factors you cannot control include:   Race. You may be at higher risk if you are African American.  Age. Risk increases with age.  Gender. Men are at higher risk than women before age 45 years. After age 65, women are at higher risk than men. Risk factors you can control include:  Not getting enough exercise or physical activity.  Being overweight.  Getting too much fat, sugar, calories, or salt in your diet.  Drinking too much alcohol. SIGNS AND SYMPTOMS Hypertension does not usually cause signs or symptoms. Extremely high blood pressure (hypertensive crisis) may cause headache, anxiety, shortness of breath, and nosebleed. DIAGNOSIS To check if you have hypertension, your health care provider will measure your blood pressure while you are seated, with your arm held at the level of your heart. It should be measured at least twice using the same arm. Certain conditions can cause a difference in blood pressure between your right and left arms. A blood pressure reading that is higher than normal on one occasion does not mean that you need treatment. If  it is not clear whether you have high blood pressure, you may be asked to return on a different day to have your blood pressure checked again. Or, you may be asked to monitor your blood pressure at home for 1 or more weeks. TREATMENT Treating high blood pressure includes making lifestyle changes and possibly taking medicine. Living a healthy lifestyle can help lower high blood pressure. You may need to change some of your habits. Lifestyle changes may include:  Following the DASH diet. This diet is high in fruits, vegetables, and whole grains. It is low in salt, red meat, and added sugars.  Keep your sodium intake below 2,300 mg per day.  Getting at least 30-45 minutes of aerobic exercise at least 4 times per week.  Losing weight if necessary.  Not smoking.  Limiting alcoholic beverages.  Learning ways to reduce stress. Your health care provider may prescribe medicine if lifestyle changes are not enough to get your blood pressure under control, and if one of the following is true:  You are 18-59 years of age and your systolic blood pressure is above 140.  You are 60 years of age or older, and your systolic blood pressure is above 150.  Your diastolic blood pressure is above 90.  You have diabetes, and your systolic blood pressure is over 140 or your diastolic blood pressure is over 90.  You have kidney disease and your blood pressure is above 140/90.  You have heart disease and your blood pressure is above 140/90. Your personal target blood pressure may vary depending on your medical conditions, your age, and other factors. HOME CARE INSTRUCTIONS    Have your blood pressure rechecked as directed by your health care provider.   Take medicines only as directed by your health care provider. Follow the directions carefully. Blood pressure medicines must be taken as prescribed. The medicine does not work as well when you skip doses. Skipping doses also puts you at risk for  problems.  Do not smoke.   Monitor your blood pressure at home as directed by your health care provider. SEEK MEDICAL CARE IF:   You think you are having a reaction to medicines taken.  You have recurrent headaches or feel dizzy.  You have swelling in your ankles.  You have trouble with your vision. SEEK IMMEDIATE MEDICAL CARE IF:  You develop a severe headache or confusion.  You have unusual weakness, numbness, or feel faint.  You have severe chest or abdominal pain.  You vomit repeatedly.  You have trouble breathing. MAKE SURE YOU:   Understand these instructions.  Will watch your condition.  Will get help right away if you are not doing well or get worse.   This information is not intended to replace advice given to you by your health care provider. Make sure you discuss any questions you have with your health care provider.   Document Released: 07/23/2005 Document Revised: 12/07/2014 Document Reviewed: 05/15/2013 Elsevier Interactive Patient Education 2016 [employer]  

## 2016-04-17 LAB — CMP14+EGFR
ALK PHOS: 47 IU/L (ref 39–117)
ALT: 13 IU/L (ref 0–32)
AST: 11 IU/L (ref 0–40)
Albumin/Globulin Ratio: 1.4 (ref 1.2–2.2)
Albumin: 4 g/dL (ref 3.5–5.5)
BUN/Creatinine Ratio: 14 (ref 9–23)
BUN: 14 mg/dL (ref 6–24)
Bilirubin Total: 0.4 mg/dL (ref 0.0–1.2)
CO2: 26 mmol/L (ref 18–29)
CREATININE: 1.01 mg/dL — AB (ref 0.57–1.00)
Calcium: 9.2 mg/dL (ref 8.7–10.2)
Chloride: 100 mmol/L (ref 96–106)
GFR calc Af Amer: 79 mL/min/{1.73_m2} (ref >59)
GFR calc non Af Amer: 68 mL/min/{1.73_m2} (ref >59)
GLUCOSE: 95 mg/dL (ref 65–99)
Globulin, Total: 2.8 g/dL (ref 1.5–4.5)
Potassium: 3.9 mmol/L (ref 3.5–5.2)
SODIUM: 142 mmol/L (ref 134–144)
Total Protein: 6.8 g/dL (ref 6.0–8.5)

## 2016-04-17 LAB — LIPID PANEL
CHOLESTEROL TOTAL: 180 mg/dL (ref 100–199)
Chol/HDL Ratio: 3.8 ratio units (ref 0.0–4.4)
HDL: 48 mg/dL (ref >39)
LDL CALC: 105 mg/dL — AB (ref 0–99)
Triglycerides: 136 mg/dL (ref 0–149)
VLDL CHOLESTEROL CAL: 27 mg/dL (ref 5–40)

## 2016-04-19 NOTE — Telephone Encounter (Signed)
Patient seen 09/11

## 2016-04-23 ENCOUNTER — Ambulatory Visit (INDEPENDENT_AMBULATORY_CARE_PROVIDER_SITE_OTHER): Payer: Medicaid Other | Admitting: Family

## 2016-04-23 ENCOUNTER — Encounter: Payer: Self-pay | Admitting: Family

## 2016-04-23 VITALS — BP 128/79 | HR 77 | Temp 98.7°F | Wt 233.0 lb

## 2016-04-23 DIAGNOSIS — I10 Essential (primary) hypertension: Secondary | ICD-10-CM | POA: Diagnosis not present

## 2016-04-23 DIAGNOSIS — Z23 Encounter for immunization: Secondary | ICD-10-CM

## 2016-04-23 NOTE — Progress Notes (Signed)
   Subjective:    Patient ID: Kelli Walls, female    DOB: [birth year], 45 y.o.   MRN: [id number]  Pt presents to the office today to recheck HTN. Pt's BP is at goal today!! Hypertension  This is a chronic problem. The current episode started more than 1 year ago. The problem has been resolved since onset. The problem is controlled. Pertinent negatives include no anxiety, blurred vision, headaches, palpitations, peripheral edema or shortness of breath. Risk factors for coronary artery disease include dyslipidemia, obesity and sedentary lifestyle. Past treatments include diuretics, angiotensin blockers, beta blockers and calcium channel blockers. The current treatment provides moderate improvement. There is no history of CAD/MI, CVA or heart failure.      Review of Systems  Eyes: Negative for blurred vision.  Respiratory: Negative for shortness of breath.   Cardiovascular: Negative for palpitations.  Neurological: Negative for headaches.  All other systems reviewed and are negative.      Objective:   Physical Exam  Constitutional: She is oriented to person, place, and time. She appears well-developed and well-nourished. No distress.  HENT:  Head: Normocephalic.  Cardiovascular: Normal rate, regular rhythm, normal heart sounds and intact distal pulses.   No murmur heard. Pulmonary/Chest: Effort normal and breath sounds normal. No respiratory distress. She has no wheezes.  Abdominal: Soft. Bowel sounds are normal. She exhibits no distension. There is no tenderness.  Musculoskeletal: Normal range of motion. She exhibits no edema or tenderness.  Neurological: She is alert and oriented to person, place, and time.  Skin: Skin is warm and dry.  Psychiatric: She has a normal mood and affect. Her behavior is normal. Judgment and thought content normal.  Vitals reviewed.   BP 128/79   Pulse 77   Temp 98.7 F (37.1 C) (Oral)   Wt 233 lb (105.7 kg)   BMI 42.62 kg/m        Assessment &  Plan:  1. Essential hypertension -Dash diet information given -Exercise encouraged - Stress Management  -Continue current meds -RTO in 3 months  Influenza vaccine given today  Evelina Dun, FNP

## 2016-04-23 NOTE — Patient Instructions (Signed)
DASH Eating Plan °DASH stands for "Dietary Approaches to Stop Hypertension." The DASH eating plan is a healthy eating plan that has been shown to reduce high blood pressure (hypertension). Additional health benefits may include reducing the risk of type 2 diabetes mellitus, heart disease, and stroke. The DASH eating plan may also help with weight loss. °WHAT DO I NEED TO KNOW ABOUT THE DASH EATING PLAN? °For the DASH eating plan, you will follow these general guidelines: °· Choose foods with a percent daily value for sodium of less than 5% (as listed on the food label). °· Use salt-free seasonings or herbs instead of table salt or sea salt. °· Check with your health care provider or pharmacist before using salt substitutes. °· Eat lower-sodium products, often labeled as "lower sodium" or "no salt added." °· Eat fresh foods. °· Eat more vegetables, fruits, and low-fat dairy products. °· Choose whole grains. Look for the word "whole" as the first word in the ingredient list. °· Choose fish and skinless chicken or turkey more often than red meat. Limit fish, poultry, and meat to 6 oz (170 g) each day. °· Limit sweets, desserts, sugars, and sugary drinks. °· Choose heart-healthy fats. °· Limit cheese to 1 oz (28 g) per day. °· Eat more home-cooked food and less restaurant, buffet, and fast food. °· Limit fried foods. °· Cook foods using methods other than frying. °· Limit canned vegetables. If you do use them, rinse them well to decrease the sodium. °· When eating at a restaurant, ask that your food be prepared with less salt, or no salt if possible. °WHAT FOODS CAN I EAT? °Seek help from a dietitian for individual calorie needs. °Grains °Whole grain or whole wheat bread. Brown rice. Whole grain or whole wheat pasta. Quinoa, bulgur, and whole grain cereals. Low-sodium cereals. Corn or whole wheat flour tortillas. Whole grain cornbread. Whole grain crackers. Low-sodium crackers. °Vegetables °Fresh or frozen vegetables  (raw, steamed, roasted, or grilled). Low-sodium or reduced-sodium tomato and vegetable juices. Low-sodium or reduced-sodium tomato sauce and paste. Low-sodium or reduced-sodium canned vegetables.  °Fruits °All fresh, canned (in natural juice), or frozen fruits. °Meat and Other Protein Products °Ground beef (85% or leaner), grass-fed beef, or beef trimmed of fat. Skinless chicken or turkey. Ground chicken or turkey. Pork trimmed of fat. All fish and seafood. Eggs. Dried beans, peas, or lentils. Unsalted nuts and seeds. Unsalted canned beans. °Dairy °Low-fat dairy products, such as skim or 1% milk, 2% or reduced-fat cheeses, low-fat ricotta or cottage cheese, or plain low-fat yogurt. Low-sodium or reduced-sodium cheeses. °Fats and Oils °Tub margarines without trans fats. Light or reduced-fat mayonnaise and salad dressings (reduced sodium). Avocado. Safflower, olive, or canola oils. Natural peanut or almond butter. °Other °Unsalted popcorn and pretzels. °The items listed above may not be a complete list of recommended foods or beverages. Contact your dietitian for more options. °WHAT FOODS ARE NOT RECOMMENDED? °Grains °White bread. White pasta. White rice. Refined cornbread. Bagels and croissants. Crackers that contain trans fat. °Vegetables °Creamed or fried vegetables. Vegetables in a cheese sauce. Regular canned vegetables. Regular canned tomato sauce and paste. Regular tomato and vegetable juices. °Fruits °Dried fruits. Canned fruit in light or heavy syrup. Fruit juice. °Meat and Other Protein Products °Fatty cuts of meat. Ribs, chicken wings, bacon, sausage, bologna, salami, chitterlings, fatback, hot dogs, bratwurst, and packaged luncheon meats. Salted nuts and seeds. Canned beans with salt. °Dairy °Whole or 2% milk, cream, half-and-half, and cream cheese. Whole-fat or sweetened yogurt. Full-fat   cheeses or blue cheese. Nondairy creamers and whipped toppings. Processed cheese, cheese spreads, or cheese  curds. °Condiments °Onion and garlic salt, seasoned salt, table salt, and sea salt. Canned and packaged gravies. Worcestershire sauce. Tartar sauce. Barbecue sauce. Teriyaki sauce. Soy sauce, including reduced sodium. Steak sauce. Fish sauce. Oyster sauce. Cocktail sauce. Horseradish. Ketchup and mustard. Meat flavorings and tenderizers. Bouillon cubes. Hot sauce. Tabasco sauce. Marinades. Taco seasonings. Relishes. °Fats and Oils °Butter, stick margarine, lard, shortening, ghee, and bacon fat. Coconut, palm kernel, or palm oils. Regular salad dressings. °Other °Pickles and olives. Salted popcorn and pretzels. °The items listed above may not be a complete list of foods and beverages to avoid. Contact your dietitian for more information. °WHERE CAN I FIND MORE INFORMATION? °National [employer], [employer], [employer]: www.nhlbi.nih.gov/health/health-topics/topics/dash/ °  °This information is not intended to replace advice given to you by your health care provider. Make sure you discuss any questions you have with your health care provider. °  °Document Released: 07/12/2011 Document Revised: 08/13/2014 Document Reviewed: 05/27/2013 °Elsevier Interactive Patient Education ©2016 Elsevier Inc. ° °Hypertension °Hypertension, commonly called high blood pressure, is when the force of blood pumping through your arteries is too strong. Your arteries are the blood vessels that carry blood from your heart throughout your body. A blood pressure reading consists of a higher number over a lower number, such as 110/72. The higher number (systolic) is the pressure inside your arteries when your heart pumps. The lower number (diastolic) is the pressure inside your arteries when your heart relaxes. Ideally you want your blood pressure below 120/80. °Hypertension forces your heart to work harder to pump blood. Your arteries may become narrow or stiff. Having untreated or uncontrolled hypertension can cause heart attack, stroke, kidney  disease, and other problems. °RISK FACTORS °Some risk factors for high blood pressure are controllable. Others are not.  °Risk factors you cannot control include:  °· Race. You may be at higher risk if you are African American. °· Age. Risk increases with age. °· Gender. Men are at higher risk than women before age 45 years. After age 65, women are at higher risk than men. °Risk factors you can control include: °· Not getting enough exercise or physical activity. °· Being overweight. °· Getting too much fat, sugar, calories, or salt in your diet. °· Drinking too much alcohol. °SIGNS AND SYMPTOMS °Hypertension does not usually cause signs or symptoms. Extremely high blood pressure (hypertensive crisis) may cause headache, anxiety, shortness of breath, and nosebleed. °DIAGNOSIS °To check if you have hypertension, your health care provider will measure your blood pressure while you are seated, with your arm held at the level of your heart. It should be measured at least twice using the same arm. Certain conditions can cause a difference in blood pressure between your right and left arms. A blood pressure reading that is higher than normal on one occasion does not mean that you need treatment. If it is not clear whether you have high blood pressure, you may be asked to return on a different day to have your blood pressure checked again. Or, you may be asked to monitor your blood pressure at home for 1 or more weeks. °TREATMENT °Treating high blood pressure includes making lifestyle changes and possibly taking medicine. Living a healthy lifestyle can help lower high blood pressure. You may need to change some of your habits. °Lifestyle changes may include: °· Following the DASH diet. This diet is high in fruits, vegetables, and whole   grains. It is low in salt, red meat, and added sugars. °· Keep your sodium intake below 2,300 mg per day. °· Getting at least 30-45 minutes of aerobic exercise at least 4 times per  week. °· Losing weight if necessary. °· Not smoking. °· Limiting alcoholic beverages. °· Learning ways to reduce stress. °Your health care provider may prescribe medicine if lifestyle changes are not enough to get your blood pressure under control, and if one of the following is true: °· You are 18-59 years of age and your systolic blood pressure is above 140. °· You are 60 years of age or older, and your systolic blood pressure is above 150. °· Your diastolic blood pressure is above 90. °· You have diabetes, and your systolic blood pressure is over 140 or your diastolic blood pressure is over 90. °· You have kidney disease and your blood pressure is above 140/90. °· You have heart disease and your blood pressure is above 140/90. °Your personal target blood pressure may vary depending on your medical conditions, your age, and other factors. °HOME CARE INSTRUCTIONS °· Have your blood pressure rechecked as directed by your health care provider.   °· Take medicines only as directed by your health care provider. Follow the directions carefully. Blood pressure medicines must be taken as prescribed. The medicine does not work as well when you skip doses. Skipping doses also puts you at risk for problems. °· Do not smoke.   °· Monitor your blood pressure at home as directed by your health care provider.  °SEEK MEDICAL CARE IF:  °· You think you are having a reaction to medicines taken. °· You have recurrent headaches or feel dizzy. °· You have swelling in your ankles. °· You have trouble with your vision. °SEEK IMMEDIATE MEDICAL CARE IF: °· You develop a severe headache or confusion. °· You have unusual weakness, numbness, or feel faint. °· You have severe chest or abdominal pain. °· You vomit repeatedly. °· You have trouble breathing. °MAKE SURE YOU:  °· Understand these instructions. °· Will watch your condition. °· Will get help right away if you are not doing well or get worse. °  °This information is not intended to  replace advice given to you by your health care provider. Make sure you discuss any questions you have with your health care provider. °  °Document Released: 07/23/2005 Document Revised: 12/07/2014 Document Reviewed: 05/15/2013 °Elsevier Interactive Patient Education ©2016 Elsevier Inc. ° °

## 2016-05-07 ENCOUNTER — Other Ambulatory Visit: Payer: Self-pay | Admitting: Family

## 2016-05-07 DIAGNOSIS — Z1231 Encounter for screening mammogram for malignant neoplasm of breast: Secondary | ICD-10-CM

## 2016-05-21 ENCOUNTER — Ambulatory Visit
Admission: RE | Admit: 2016-05-21 | Discharge: 2016-05-21 | Disposition: A | Discharge status: Home / Self Care | Payer: Medicaid Other | Source: Ambulatory Visit | Attending: Family | Admitting: Family

## 2016-05-21 DIAGNOSIS — Z1231 Encounter for screening mammogram for malignant neoplasm of breast: Secondary | ICD-10-CM

## 2016-07-17 ENCOUNTER — Encounter: Payer: Self-pay | Admitting: Nurse Practitioner

## 2016-07-17 ENCOUNTER — Ambulatory Visit (INDEPENDENT_AMBULATORY_CARE_PROVIDER_SITE_OTHER): Payer: Medicaid Other | Admitting: Nurse Practitioner

## 2016-07-17 VITALS — BP 127/81 | HR 72 | Ht 62.0 in | Wt 237.8 lb

## 2016-07-17 DIAGNOSIS — I10 Essential (primary) hypertension: Secondary | ICD-10-CM

## 2016-07-17 DIAGNOSIS — G43009 Migraine without aura, not intractable, without status migrainosus: Secondary | ICD-10-CM | POA: Diagnosis not present

## 2016-07-17 MED ORDER — SUMATRIPTAN SUCCINATE 50 MG PO TABS: 50.0000 mg | ORAL_TABLET | ORAL | 6 refills | Status: DC | PRN

## 2016-07-17 NOTE — Progress Notes (Signed)
I have reviewed and agreed above plan. 

## 2016-07-17 NOTE — Patient Instructions (Signed)
Continue Imitrex acutely for migraine, will refill Migraine tracker APP Follow-up yearly and when necessary

## 2016-07-17 NOTE — Progress Notes (Signed)
[facility]  PATIENT: Kelli Walls DOB: Dec 08, 1970   REASON FOR VISIT: Follow-up for migraine HISTORY FROM: Patient, mother    HISTORY OF PRESENT ILLNESS: HISTORY:Kelli Walls is a 45 yo RH African American female, accompanied by her mother, referred by her primary care physician Dr. Redge Gainer for evaluation of mild abnormal MRI scan She was born only 4 pounds, developmentally delayed, she learned to walk at 42 and 45 years old, was diagnosed with mild mental retardation, she graduated from special grade school at 12th grade, lives with her mother now, she has frequent raging outburst when she was younger, which is even worse now. In March 24 2013, she had argument with her boyfriend, began to experience elevated blood pressure, numbness at her mouth, at her forehead, with associated severe headache, this led to MRI of the brain, scattered subcortical T2 hyperintensities are likely within normal limits for age. The largest lesion is in the anterior left frontal lobe, measuring 8 mm.  She has a history of headaches since teenager, her headache are retrorbital area severe pounding headache with associated light noise sensitivity, pressure, she sees flashlight in her visual field, lasting for 15 minutes, headaches can last up to 6 hours, trigger for her headaches are stress, exertion, menstruation, She took tylenol, ibuprofen, Excedrin Migraine are helpful too.   UPDATE 07/15/2014: Her headaches has much improved, only a couple times a month, sumatriptan has been very helpful,However, she has stopped taking her medications including antihypertension, antidepression medications without medical advice, she has missed her psychiatry appointment, she denies significant depression at this point. UPDATE 07/18/2015 Kelli Walls, 45 year old female returns for follow-up with her mother. She has a history of migraine headaches and is currently having less than 1 headache per month. Her  headaches are relieved with Imitrex. She returns for reevaluation UPDATE 12/12/2017CM Kelli Walls, 45 year old female returns for follow-up with her mom. She has history of migraine headaches which were in excellent control. She takes Imitrex acutely. She has no more than 1 headache per month. She returns for reevaluation she needs refills. No other neurologic complaints  REVIEW OF SYSTEMS: Full 14 system review of systems performed and notable only for those listed, all others are neg:  Constitutional: neg  Cardiovascular: neg Ear/Nose/Throat: neg  Skin: neg Eyes: neg Respiratory: Cough Gastroitestinal: neg  Hematology/Lymphatic: neg  Endocrine: neg Musculoskeletal:neg Allergy/Immunology: neg Neurological: neg Psychiatric: neg Sleep : neg   ALLERGIES: Allergies  Allergen Reactions  . Penicillins Itching    HOME MEDICATIONS: Outpatient Medications Prior to Visit  Medication Sig Dispense Refill  . amLODipine (NORVASC) 10 MG tablet Take 1 tablet (10 mg total) by mouth daily. 90 tablet 3  . atorvastatin (LIPITOR) 40 MG tablet Take 1 tablet (40 mg total) by mouth daily. 90 tablet 3  . citalopram (CELEXA) 10 MG tablet Take 1 tablet (10 mg total) by mouth daily. 30 tablet 0  . fluticasone (FLONASE) 50 MCG/ACT nasal spray Place 2 sprays into both nostrils daily. 16 g 6  . losartan-hydrochlorothiazide (HYZAAR) 100-25 MG tablet Take 1 tablet by mouth daily. 30 tablet 11  . metoprolol succinate (TOPROL-XL) 100 MG 24 hr tablet Take 1 tablet (100 mg total) by mouth daily. Take with or immediately following a meal. 90 tablet 3  . Multiple Vitamin (MULTIVITAMIN WITH MINERALS) TABS tablet Take 1 tablet by mouth daily.    . SUMAtriptan (IMITREX) 50 MG tablet Take 1 tablet (50 mg total) by mouth every 2 (two) hours as needed for migraine. May  repeat in 2 hours if headache persists or recurs. 10 tablet 11  . risperiDONE (RISPERDAL M-TABS) 0.5 MG disintegrating tablet Take 1 tablet (0.5 mg total) by  mouth 2 (two) times daily. (Patient not taking: Reported on 07/17/2016) 60 tablet 0   No facility-administered medications prior to visit.     PAST MEDICAL HISTORY: Past Medical History:  Diagnosis Date  . Asthma   . Depression   . Hyperlipidemia   . Hypertension   . Mental retardation, mild (I.Q. 50-70)   . Migraine   . Suicide attempt     PAST SURGICAL HISTORY: Past Surgical History:  Procedure Laterality Date  . ENDOMETRIAL ABLATION    . TONSILLECTOMY      FAMILY HISTORY: Family History  Problem Relation Age of Onset  . Diabetes Mother   . Hyperlipidemia Mother     SOCIAL HISTORY: Social History   Social History  . Marital status: Divorced    Spouse name: N/A  . Number of children: 0  . Years of education: 12   Occupational History  . Unemployed     Social History Main Topics  . Smoking status: Never Smoker  . Smokeless tobacco: Never Used  . Alcohol use No     Comment: pt reports a history of drinking etoh but denies current use  . Drug use: No  . Sexual activity: No   Other Topics Concern  . Not on file   Social History Narrative   Patient lives at home with her mother Mellody Dance.    Patient is single.    Patient has no children.    Patient has 12th grade education.      PHYSICAL EXAM  Vitals:   07/17/16 1246  BP: 127/81  Pulse: 72  Weight: 237 lb 12.8 oz (107.9 kg)  Height: 5\' 2"  (1.575 m)   Body mass index is 43.49 kg/m. Generalized: In no acute distress, obese female Neck: Supple, no carotid bruits  Musculoskeletal: No deformity  Neurological examination Mentation: Alert oriented to time, place, history taking, and causual conversation Cranial nerve II-XII: Pupils were equal round reactive to light. visual field were full on confrontational test. She has difficulty with left eye horizontal movement, could not do left eye abduction or adduction. She also has difficulty with right eye abduction. Mild asymmetry, underdeveloped right  face. hearing was intact to finger rubbing bilaterally. Uvula tongue midline. head turning and shoulder shrug and were normal and symmetric.Tongue protrusion into cheek strength was normal. Motor: normal tone, bulk and strength. Sensory: Intact to fine touch, pinprick, preserved vibratory sensation, in the upper and lower extremities Coordination: Normal finger to nose, heel-to-shin bilaterally there was no truncal ataxia Gait: Rising up from seated position without assistance, normal stance, without trunk ataxia, moderate stride, good arm swing, smooth turning, able to perform tiptoe, and heel walking without difficulty.  Romberg signs: Negative Deep tendon reflexes: Brachioradialis 2/2, biceps 2/2, triceps 2/2, patellar 2/2, Achilles 2/2, plantar responses were flexor bilaterally.  DIAGNOSTIC DATA (LABS, IMAGING, TESTING) - I reviewed patient records, labs, notes, testing and imaging myself where available.      Component Value Date/Time   NA 142 04/16/2016 1224   K 3.9 04/16/2016 1224   CL 100 04/16/2016 1224   CO2 26 04/16/2016 1224   GLUCOSE 95 04/16/2016 1224   GLUCOSE 91 05/23/2013 0434   BUN 14 04/16/2016 1224   CREATININE 1.01 (H) 04/16/2016 1224   CREATININE 1.12 (H) 12/19/2012 1017   CALCIUM 9.2 04/16/2016 1224  PROT 6.8 04/16/2016 1224   ALBUMIN 4.0 04/16/2016 1224   AST 11 04/16/2016 1224   ALT 13 04/16/2016 1224   ALKPHOS 47 04/16/2016 1224   BILITOT 0.4 04/16/2016 1224   GFRNONAA 68 04/16/2016 1224   GFRAA 79 04/16/2016 1224   Lab Results  Component Value Date   CHOL 180 04/16/2016   HDL 48 04/16/2016   LDLCALC 105 (H) 04/16/2016   TRIG 136 04/16/2016   CHOLHDL 3.8 04/16/2016    ASSESSMENT AND PLAN  45 y.o. year old female  has a past medical history of Hypertension; Asthma; Depression; Suicide attempt ([place]); Mental retardation, mild (I.Q. 50-70); Hyperlipidemia; and Migraine. here to follow-up. She has less than one headache per month, currently relieved  with Imitrex.  Continue Imitrex acutely for migraine, will refill Continue record migraines with migraine tracker APP Follow-up yearly and when necessary Kelli Walls, [facility], [facility], APRN  [facility] [address] [place], [place] [address] [phone]

## 2016-12-11 ENCOUNTER — Other Ambulatory Visit: Payer: Self-pay | Admitting: Family

## 2016-12-11 ENCOUNTER — Ambulatory Visit (INDEPENDENT_AMBULATORY_CARE_PROVIDER_SITE_OTHER): Payer: Medicaid Other | Admitting: Family

## 2016-12-11 ENCOUNTER — Encounter: Payer: Self-pay | Admitting: Family

## 2016-12-11 VITALS — BP 138/96 | HR 100 | Temp 98.9°F | Ht 62.0 in | Wt 235.2 lb

## 2016-12-11 DIAGNOSIS — J209 Acute bronchitis, unspecified: Secondary | ICD-10-CM

## 2016-12-11 DIAGNOSIS — F331 Major depressive disorder, recurrent, moderate: Secondary | ICD-10-CM

## 2016-12-11 DIAGNOSIS — E785 Hyperlipidemia, unspecified: Secondary | ICD-10-CM

## 2016-12-11 DIAGNOSIS — I10 Essential (primary) hypertension: Secondary | ICD-10-CM | POA: Diagnosis not present

## 2016-12-11 MED ORDER — AZITHROMYCIN 250 MG PO TABS: ORAL_TABLET | ORAL | 0 refills | Status: DC

## 2016-12-11 MED ORDER — AMLODIPINE BESYLATE 10 MG PO TABS: 10.0000 mg | ORAL_TABLET | Freq: Every day | ORAL | 3 refills | Status: DC

## 2016-12-11 NOTE — Patient Instructions (Signed)
Hypertension °Hypertension is another name for high blood pressure. High blood pressure forces your heart to work harder to pump blood. This can cause problems over time. °There are two numbers in a blood pressure reading. There is a top number (systolic) over a bottom number (diastolic). It is best to have a blood pressure below 120/80. Healthy choices can help lower your blood pressure. You may need medicine to help lower your blood pressure if: °· Your blood pressure cannot be lowered with healthy choices. °· Your blood pressure is higher than 130/80. °Follow these instructions at home: °Eating and drinking  °· If directed, follow the DASH eating plan. This diet includes: °¨ Filling half of your plate at each meal with fruits and vegetables. °¨ Filling one quarter of your plate at each meal with whole grains. Whole grains include whole wheat pasta, brown rice, and whole grain bread. °¨ Eating or drinking low-fat dairy products, such as skim milk or low-fat yogurt. °¨ Filling one quarter of your plate at each meal with low-fat (lean) proteins. Low-fat proteins include fish, skinless chicken, eggs, beans, and tofu. °¨ Avoiding fatty meat, cured and processed meat, or chicken with skin. °¨ Avoiding premade or processed food. °· Eat less than 1,500 mg of salt (sodium) a day. °· Limit alcohol use to no more than 1 drink a day for nonpregnant women and 2 drinks a day for men. One drink equals 12 oz of beer, 5 oz of wine, or 1½ oz of hard liquor. °Lifestyle  °· Work with your doctor to stay at a healthy weight or to lose weight. Ask your doctor what the best weight is for you. °· Get at least 30 minutes of exercise that causes your heart to beat faster (aerobic exercise) most days of the week. This may include walking, swimming, or biking. °· Get at least 30 minutes of exercise that strengthens your muscles (resistance exercise) at least 3 days a week. This may include lifting weights or pilates. °· Do not use any  products that contain nicotine or tobacco. This includes cigarettes and e-cigarettes. If you need help quitting, ask your doctor. °· Check your blood pressure at home as told by your doctor. °· Keep all follow-up visits as told by your doctor. This is important. °Medicines  °· Take over-the-counter and prescription medicines only as told by your doctor. Follow directions carefully. °· Do not skip doses of blood pressure medicine. The medicine does not work as well if you skip doses. Skipping doses also puts you at risk for problems. °· Ask your doctor about side effects or reactions to medicines that you should watch for. °Contact a doctor if: °· You think you are having a reaction to the medicine you are taking. °· You have headaches that keep coming back (recurring). °· You feel dizzy. °· You have swelling in your ankles. °· You have trouble with your vision. °Get help right away if: °· You get a very bad headache. °· You start to feel confused. °· You feel weak or numb. °· You feel faint. °· You get very bad pain in your: °¨ Chest. °¨ Belly (abdomen). °· You throw up (vomit) more than once. °· You have trouble breathing. °Summary °· Hypertension is another name for high blood pressure. °· Making healthy choices can help lower blood pressure. If your blood pressure cannot be controlled with healthy choices, you may need to take medicine. °This information is not intended to replace advice given to you by your   health care provider. Make sure you discuss any questions you have with your health care provider. °Document Released: 01/09/2008 Document Revised: 06/20/2016 Document Reviewed: 06/20/2016 °Elsevier Interactive Patient Education © 2017 [employer] ° °

## 2016-12-11 NOTE — Progress Notes (Signed)
Subjective:    Patient ID: Kelli Walls, female    DOB: 1970-09-03, 46 y.o.   MRN: [id number]  Pt presents to the office today with cough. PT's BP is elevated. She has not taken Norvasc. Pt has been taking OTC decongestant.  Cough  The current episode started 1 to 4 weeks ago. The problem has been gradually worsening. The problem occurs every few minutes. The cough is productive of purulent sputum. Associated symptoms include nasal congestion, postnasal drip, rhinorrhea, shortness of breath and wheezing. Pertinent negatives include no chills, ear congestion, ear pain, fever, headaches, myalgias or sore throat. The symptoms are aggravated by lying down and pollens. She has tried rest and OTC cough suppressant for the symptoms. The treatment provided mild relief. There is no history of asthma or COPD.      Review of Systems  Constitutional: Negative for chills and fever.  HENT: Positive for postnasal drip and rhinorrhea. Negative for ear pain and sore throat.   Respiratory: Positive for cough, shortness of breath and wheezing.   Musculoskeletal: Negative for myalgias.  Neurological: Negative for headaches.  All other systems reviewed and are negative.      Objective:   Physical Exam  Constitutional: She is oriented to person, place, and time. She appears well-developed and well-nourished. No distress.  HENT:  Head: Normocephalic and atraumatic.  Right Ear: External ear normal.  Mouth/Throat: Oropharynx is clear and moist.  Eyes: Pupils are equal, round, and reactive to light.  Neck: Normal range of motion. Neck supple. No thyromegaly present.  Cardiovascular: Normal rate, regular rhythm, normal heart sounds and intact distal pulses.   No murmur heard. Pulmonary/Chest: Effort normal and breath sounds normal. No respiratory distress. She has no wheezes.  Constant productive cough   Abdominal: Soft. Bowel sounds are normal. She exhibits no distension. There is no tenderness.    Musculoskeletal: Normal range of motion. She exhibits no edema or tenderness.  Neurological: She is alert and oriented to person, place, and time.  Skin: Skin is warm and dry.  Psychiatric: She has a normal mood and affect. Her behavior is normal. Judgment and thought content normal.  Vitals reviewed.     BP (!) 138/96   Pulse 100   Temp 98.9 F (37.2 C) (Oral)   Ht 5\' 2"  (1.575 m)   Wt 235 lb 3.2 oz (106.7 kg)   LMP 09/07/2014   BMI 43.02 kg/m      Assessment & Plan:  1. Essential hypertension, benign Discussed importance of taking medications everyday! List of medications given to pt - amLODipine (NORVASC) 10 MG tablet; Take 1 tablet (10 mg total) by mouth daily.  Dispense: 90 tablet; Refill: 3  2. Acute bronchitis, unspecified organism - Take meds as prescribed - Use a cool mist humidifier  -Use saline nose sprays frequently -Saline irrigations of the nose can be very helpful if done frequently.  * 4X daily for 1 week*  * Use of a nettie pot can be helpful with this. Follow directions with this* -Force fluids -For any cough or congestion  Use plain Mucinex- regular strength or max strength is fine   * Children- consult with Pharmacist for dosing -For fever or aces or pains- take tylenol or ibuprofen appropriate for age and weight.  * for fevers greater than 101 orally you may alternate ibuprofen and tylenol every  3 hours. -Throat lozenges if help - azithromycin (ZITHROMAX) 250 MG tablet; Take 500 mg once, then 250 mg for four days  Dispense: 6 tablet; Refill: 0   Evelina Dun, FNP

## 2017-02-01 ENCOUNTER — Other Ambulatory Visit: Payer: Self-pay | Admitting: Family

## 2017-02-01 DIAGNOSIS — E785 Hyperlipidemia, unspecified: Secondary | ICD-10-CM

## 2017-04-24 ENCOUNTER — Other Ambulatory Visit: Payer: Self-pay | Admitting: Family

## 2017-04-24 DIAGNOSIS — Z1231 Encounter for screening mammogram for malignant neoplasm of breast: Secondary | ICD-10-CM

## 2017-05-05 ENCOUNTER — Other Ambulatory Visit: Payer: Self-pay | Admitting: Family

## 2017-05-05 DIAGNOSIS — E785 Hyperlipidemia, unspecified: Secondary | ICD-10-CM

## 2017-05-06 NOTE — Telephone Encounter (Signed)
Last seen 04/16/16  [facility]

## 2017-05-23 ENCOUNTER — Ambulatory Visit
Admission: RE | Admit: 2017-05-23 | Discharge: 2017-05-23 | Disposition: A | Discharge status: Home / Self Care | Payer: Medicaid Other | Source: Ambulatory Visit | Attending: Family | Admitting: Family

## 2017-05-23 DIAGNOSIS — Z1231 Encounter for screening mammogram for malignant neoplasm of breast: Secondary | ICD-10-CM

## 2017-05-24 ENCOUNTER — Other Ambulatory Visit: Payer: Self-pay | Admitting: Family

## 2017-05-24 DIAGNOSIS — R928 Other abnormal and inconclusive findings on diagnostic imaging of breast: Secondary | ICD-10-CM

## 2017-05-29 ENCOUNTER — Ambulatory Visit: Admission: RE | Admit: 2017-05-29 | Payer: Medicaid Other | Source: Ambulatory Visit

## 2017-05-29 ENCOUNTER — Ambulatory Visit
Admission: RE | Admit: 2017-05-29 | Discharge: 2017-05-29 | Disposition: A | Discharge status: Home / Self Care | Payer: Medicaid Other | Source: Ambulatory Visit | Attending: Family | Admitting: Family

## 2017-05-29 DIAGNOSIS — R928 Other abnormal and inconclusive findings on diagnostic imaging of breast: Secondary | ICD-10-CM

## 2017-06-11 ENCOUNTER — Ambulatory Visit (INDEPENDENT_AMBULATORY_CARE_PROVIDER_SITE_OTHER): Payer: Medicaid Other | Admitting: Family

## 2017-06-11 ENCOUNTER — Encounter: Payer: Self-pay | Admitting: Family

## 2017-06-11 VITALS — BP 142/104 | HR 99 | Temp 98.8°F | Ht 62.0 in | Wt 236.0 lb

## 2017-06-11 DIAGNOSIS — S43401A Unspecified sprain of right shoulder joint, initial encounter: Secondary | ICD-10-CM | POA: Diagnosis not present

## 2017-06-11 DIAGNOSIS — Z23 Encounter for immunization: Secondary | ICD-10-CM | POA: Diagnosis not present

## 2017-06-11 DIAGNOSIS — I10 Essential (primary) hypertension: Secondary | ICD-10-CM | POA: Diagnosis not present

## 2017-06-11 DIAGNOSIS — M25511 Pain in right shoulder: Secondary | ICD-10-CM | POA: Diagnosis not present

## 2017-06-11 MED ORDER — AMLODIPINE BESYLATE 10 MG PO TABS: 10.0000 mg | ORAL_TABLET | Freq: Every day | ORAL | 3 refills | Status: DC

## 2017-06-11 MED ORDER — NAPROXEN 500 MG PO TABS: 500.0000 mg | ORAL_TABLET | Freq: Two times a day (BID) | ORAL | 1 refills | Status: DC

## 2017-06-11 MED ORDER — CLONIDINE HCL 0.1 MG PO TABS
0.3000 mg | ORAL_TABLET | Freq: Once | ORAL | Status: AC
  Administered 2017-06-11: 0.3 mg via ORAL

## 2017-06-11 NOTE — Patient Instructions (Signed)
Shoulder Sprain A shoulder sprain is a partial or complete tear in one of the tough, fiber-like tissues (ligaments) in the shoulder. The ligaments in the shoulder help to hold the shoulder in place. What are the causes? This condition may be caused by:  A fall.  A hit to the shoulder.  A twist of the arm.  What increases the risk? This condition is more likely to develop in:  People who play sports.  People who have problems with balance or coordination.  What are the signs or symptoms? Symptoms of this condition include:  Pain when moving the shoulder.  Limited ability to move the shoulder.  Swelling and tenderness on top of the shoulder.  Warmth in the shoulder.  A change in the shape of the shoulder.  Redness or bruising on the shoulder.  How is this diagnosed? This condition is diagnosed with a physical exam. During the exam, you may be asked to do simple exercises with your shoulder. You may also have imaging tests, such as X-rays, MRI, or a CT scan. These tests can show how severe the sprain is. How is this treated? This condition may be treated with:  Rest.  Pain medicine.  Ice.  A sling or brace. This is used to keep the arm still while the shoulder is healing.  Physical therapy or rehabilitation exercises. These help to improve the range of motion and strength of the shoulder.  Surgery (rare). Surgery may be needed if the sprain caused a joint to become unstable. Surgery may also be needed to reduce pain.  Some people may develop ongoing shoulder pain or lose some range of motion in the shoulder. However, most people do not develop long-term problems. Follow these instructions at home:  Rest.  Ask your health care provider when it is safe for you to drive if you have a sling or brace on your shoulder.  Take over-the-counter and prescription medicines only as told by your health care provider.  If directed, apply ice to the area: ? Put ice in a  plastic bag. ? Place a towel between your skin and the bag. ? Leave the ice on for 20 minutes, 2-3 times per day.  If you were given a shoulder sling or brace: ? Wear it as told. ? Remove it to shower or bathe. ? Move your arm only as much as told by your health care provider, but keep your hand moving to prevent swelling.  If you were shown how to do any exercises, do them as told by your health care provider.  Keep all follow-up visits as told by your health care provider. This is important. Contact a health care provider if:  Your pain gets worse.  Your pain is not relieved with medicines.  You have increased redness or swelling. Get help right away if:  You have a fever.  You cannot move your arm or shoulder.  You develop severe numbness or tingling in your arm, hand, or fingers.  Your arm, hand, or fingers turn blue, white, or gray and feel cold. This information is not intended to replace advice given to you by your health care provider. Make sure you discuss any questions you have with your health care provider. Document Released: 12/09/2008 Document Revised: 03/18/2016 Document Reviewed: 11/15/2014 Elsevier Interactive Patient Education  2017 [employer].

## 2017-06-11 NOTE — Progress Notes (Signed)
Subjective:    Patient ID: Kelli Walls, female    DOB: [birth year], 46 y.o.   MRN: [id number]  Pt presents to the office today for shoulder pain. Pt's BP is extremely elevated today. Pt states she "thinks" she took her medication. States she is very stressed because her best friend died.  Shoulder Pain   The pain is present in the right shoulder. This is a new problem. The current episode started in the past 7 days. There has been a history of trauma (was helping mother move). The problem occurs intermittently. The problem has been waxing and waning. The quality of the pain is described as aching. The pain is at a severity of 5/10. The pain is moderate. Associated symptoms include a limited range of motion. Pertinent negatives include no inability to bear weight, itching, numbness, stiffness or tingling. The symptoms are aggravated by activity. She has tried acetaminophen and rest for the symptoms. The treatment provided mild relief.  Hypertension  This is a chronic problem. The current episode started more than 1 year ago. The problem has been waxing and waning since onset. The problem is uncontrolled. Associated symptoms include anxiety and peripheral edema. Pertinent negatives include no shortness of breath. The current treatment provides no improvement. There is no history of kidney disease, CAD/MI, CVA or heart failure.      Review of Systems  Respiratory: Negative for shortness of breath.   Musculoskeletal: Negative for stiffness.  Skin: Negative for itching.  Neurological: Negative for tingling and numbness.  All other systems reviewed and are negative.      Objective:   Physical Exam  Constitutional: She is oriented to person, place, and time. She appears well-developed and well-nourished. No distress.  HENT:  Head: Normocephalic.  Eyes: Pupils are equal, round, and reactive to light.  Neck: Normal range of motion. Neck supple. No thyromegaly present.  Cardiovascular: Normal  rate, regular rhythm, normal heart sounds and intact distal pulses.  No murmur heard. Pulmonary/Chest: Effort normal and breath sounds normal. No respiratory distress. She has no wheezes.  Abdominal: Soft. Bowel sounds are normal. She exhibits no distension. There is no tenderness.  Musculoskeletal: She exhibits no edema or tenderness.  Neurological: She is alert and oriented to person, place, and time.  Skin: Skin is warm and dry.  Psychiatric: She has a normal mood and affect. Her behavior is normal. Judgment and thought content normal.  Vitals reviewed.    BP (!) 165/121   Pulse (!) 102   Temp 98.8 F (37.1 C) (Oral)   Ht 5' 2" (1.575 m)   Wt 236 lb (107 kg)   LMP 09/07/2014   BMI 43.16 kg/m      Assessment & Plan:  1. Uncontrolled hypertension Norvasc reordered today and pt to continue her Hyzaar 100-25 mg and Metoprolol 100 mg daily -Dash diet information given -Exercise encouraged - Stress Management  -Continue current meds -RTO in 1 week - cloNIDine (CATAPRES) tablet 0.3 mg - BMP8+EGFR  2. Acute pain of right shoulder - BMP8+EGFR - naproxen (NAPROSYN) 500 MG tablet; Take 1 tablet (500 mg total) 2 (two) times daily with a meal by mouth.  Dispense: 60 tablet; Refill: 1  3. Sprain of right shoulder, unspecified shoulder sprain type, initial encounter Rest Ice  ROM exercises encouraged  - BMP8+EGFR - naproxen (NAPROSYN) 500 MG tablet; Take 1 tablet (500 mg total) 2 (two) times daily with a meal by mouth.  Dispense: 60 tablet; Refill: Calcium,  FNP  

## 2017-06-12 LAB — BMP8+EGFR
BUN/Creatinine Ratio: 12 (ref 9–23)
BUN: 13 mg/dL (ref 6–24)
CHLORIDE: 103 mmol/L (ref 96–106)
CO2: 25 mmol/L (ref 20–29)
Calcium: 9.8 mg/dL (ref 8.7–10.2)
Creatinine, Ser: 1.08 mg/dL — ABNORMAL HIGH (ref 0.57–1.00)
GFR calc Af Amer: 72 mL/min/{1.73_m2} (ref >59)
GFR calc non Af Amer: 63 mL/min/{1.73_m2} (ref >59)
GLUCOSE: 101 mg/dL — AB (ref 65–99)
Potassium: 4 mmol/L (ref 3.5–5.2)
SODIUM: 142 mmol/L (ref 134–144)

## 2017-06-14 ENCOUNTER — Ambulatory Visit: Payer: Self-pay | Admitting: Family

## 2017-07-06 ENCOUNTER — Other Ambulatory Visit: Payer: Self-pay | Admitting: Family

## 2017-07-06 DIAGNOSIS — E785 Hyperlipidemia, unspecified: Secondary | ICD-10-CM

## 2017-07-17 ENCOUNTER — Ambulatory Visit: Payer: Medicaid Other | Admitting: Nurse Practitioner

## 2017-07-17 NOTE — Progress Notes (Deleted)
[facility]  PATIENT: Kelli Walls DOB: [birth year]   REASON FOR VISIT: Follow-up for migraine HISTORY FROM: Patient, mother    HISTORY OF PRESENT ILLNESS: HISTORY:Kelli Walls is a 46 yo RH African American female, accompanied by her mother, referred by her primary care physician Dr. Redge Gainer for evaluation of mild abnormal MRI scan She was born only 4 pounds, developmentally delayed, she learned to walk at 59 and 46 years old, was diagnosed with mild mental retardation, she graduated from special grade school at 12th grade, lives with her mother now, she has frequent raging outburst when she was younger, which is even worse now. In March 24 2013, she had argument with her boyfriend, began to experience elevated blood pressure, numbness at her mouth, at her forehead, with associated severe headache, this led to MRI of the brain, scattered subcortical T2 hyperintensities are likely within normal limits for age. The largest lesion is in the anterior left frontal lobe, measuring 8 mm.  She has a history of headaches since teenager, her headache are retrorbital area severe pounding headache with associated light noise sensitivity, pressure, she sees flashlight in her visual field, lasting for 15 minutes, headaches can last up to 6 hours, trigger for her headaches are stress, exertion, menstruation, She took tylenol, ibuprofen, Excedrin Migraine are helpful too.   UPDATE 07/15/2014: Her headaches has much improved, only a couple times a month, sumatriptan has been very helpful,However, she has stopped taking her medications including antihypertension, antidepression medications without medical advice, she has missed her psychiatry appointment, she denies significant depression at this point. UPDATE 07/18/2015 Kelli Walls, 46 year old female returns for follow-up with her mother. She has a history of migraine headaches and is currently having less than 1 headache per month. Her  headaches are relieved with Imitrex. She returns for reevaluation UPDATE 12/12/2017CM Kelli Walls, 46 year old female returns for follow-up with her mom. She has history of migraine headaches which were in excellent control. She takes Imitrex acutely. She has no more than 1 headache per month. She returns for reevaluation she needs refills. No other neurologic complaints  REVIEW OF SYSTEMS: Full 14 system review of systems performed and notable only for those listed, all others are neg:  Constitutional: neg  Cardiovascular: neg Ear/Nose/Throat: neg  Skin: neg Eyes: neg Respiratory: Cough Gastroitestinal: neg  Hematology/Lymphatic: neg  Endocrine: neg Musculoskeletal:neg Allergy/Immunology: neg Neurological: neg Psychiatric: neg Sleep : neg   ALLERGIES: Allergies  Allergen Reactions  . Penicillins Itching    HOME MEDICATIONS: Outpatient Medications Prior to Visit  Medication Sig Dispense Refill  . amLODipine (NORVASC) 10 MG tablet Take 1 tablet (10 mg total) daily by mouth. 90 tablet 3  . atorvastatin (LIPITOR) 40 MG tablet TAKE 1 TABLET BY MOUTH ONCE DAILY (MUST BE SEEN BEFORE NEXT REFILL) (Patient not taking: Reported on 06/11/2017) 30 tablet 0  . atorvastatin (LIPITOR) 40 MG tablet TAKE 1 TABLET BY MOUTH ONCE DAILY (MUST BE SEEN BEFORE NEXT REFILL) 30 tablet 0  . citalopram (CELEXA) 10 MG tablet TAKE ONE TABLET BY MOUTH ONCE DAILY (Patient not taking: Reported on 06/11/2017) 30 tablet 0  . losartan-hydrochlorothiazide (HYZAAR) 100-25 MG tablet Take 1 tablet by mouth daily. 30 tablet 11  . metoprolol succinate (TOPROL-XL) 100 MG 24 hr tablet Take 1 tablet (100 mg total) by mouth daily. Take with or immediately following a meal. 90 tablet 3  . naproxen (NAPROSYN) 500 MG tablet Take 1 tablet (500 mg total) 2 (two) times daily with a meal by mouth.  60 tablet 1  . SUMAtriptan (IMITREX) 50 MG tablet Take 1 tablet (50 mg total) by mouth every 2 (two) hours as needed for migraine. May repeat  in 2 hours if headache persists or recurs. (Patient not taking: Reported on 06/11/2017) 10 tablet 6   No facility-administered medications prior to visit.     PAST MEDICAL HISTORY: Past Medical History:  Diagnosis Date  . Asthma   . Depression   . Hyperlipidemia   . Hypertension   . Mental retardation, mild (I.Q. 50-70)   . Migraine   . Suicide attempt [facility])     PAST SURGICAL HISTORY: Past Surgical History:  Procedure Laterality Date  . ABDOMINAL HYSTERECTOMY    . ENDOMETRIAL ABLATION    . TONSILLECTOMY      FAMILY HISTORY: Family History  Problem Relation Age of Onset  . Diabetes Mother   . Hyperlipidemia Mother   . Breast cancer Maternal Aunt     SOCIAL HISTORY: Social History   Socioeconomic History  . Marital status: Divorced    Spouse name: Not on file  . Number of children: 0  . Years of education: 71  . Highest education level: Not on file  Social Needs  . Financial resource strain: Not on file  . Food insecurity - worry: Not on file  . Food insecurity - inability: Not on file  . Transportation needs - medical: Not on file  . Transportation needs - non-medical: Not on file  Occupational History  . Occupation: Unemployed   Tobacco Use  . Smoking status: Never Smoker  . Smokeless tobacco: Never Used  Substance and Sexual Activity  . Alcohol use: No    Alcohol/week: 0.0 oz    Comment: pt reports a history of drinking etoh but denies current use  . Drug use: No  . Sexual activity: No  Other Topics Concern  . Not on file  Social History Narrative   Patient lives at home with her mother Kelli Walls.    Patient is single.    Patient has no children.    Patient has 12th grade education.      PHYSICAL EXAM  There were no vitals filed for this visit. There is no height or weight on file to calculate BMI. Generalized: In no acute distress, obese female Neck: Supple, no carotid bruits  Musculoskeletal: No deformity  Neurological  examination Mentation: Alert oriented to time, place, history taking, and causual conversation Cranial nerve II-XII: Pupils were equal round reactive to light. visual field were full on confrontational test. She has difficulty with left eye horizontal movement, could not do left eye abduction or adduction. She also has difficulty with right eye abduction. Mild asymmetry, underdeveloped right face. hearing was intact to finger rubbing bilaterally. Uvula tongue midline. head turning and shoulder shrug and were normal and symmetric.Tongue protrusion into cheek strength was normal. Motor: normal tone, bulk and strength. Sensory: Intact to fine touch, pinprick, preserved vibratory sensation, in the upper and lower extremities Coordination: Normal finger to nose, heel-to-shin bilaterally there was no truncal ataxia Gait: Rising up from seated position without assistance, normal stance, without trunk ataxia, moderate stride, good arm swing, smooth turning, able to perform tiptoe, and heel walking without difficulty.  Romberg signs: Negative Deep tendon reflexes: Brachioradialis 2/2, biceps 2/2, triceps 2/2, patellar 2/2, Achilles 2/2, plantar responses were flexor bilaterally.  DIAGNOSTIC DATA (LABS, IMAGING, TESTING) - I reviewed patient records, labs, notes, testing and imaging myself where available.      Component Value  Date/Time   NA 142 06/11/2017 1654   K 4.0 06/11/2017 1654   CL 103 06/11/2017 1654   CO2 25 06/11/2017 1654   GLUCOSE 101 (H) 06/11/2017 1654   GLUCOSE 91 05/23/2013 0434   BUN 13 06/11/2017 1654   CREATININE 1.08 (H) 06/11/2017 1654   CREATININE 1.12 (H) 12/19/2012 1017   CALCIUM 9.8 06/11/2017 1654   PROT 6.8 04/16/2016 1224   ALBUMIN 4.0 04/16/2016 1224   AST 11 04/16/2016 1224   ALT 13 04/16/2016 1224   ALKPHOS 47 04/16/2016 1224   BILITOT 0.4 04/16/2016 1224   GFRNONAA 63 06/11/2017 1654   GFRAA 72 06/11/2017 1654   Lab Results  Component Value Date   CHOL 180  04/16/2016   HDL 48 04/16/2016   LDLCALC 105 (H) 04/16/2016   TRIG 136 04/16/2016   CHOLHDL 3.8 04/16/2016    ASSESSMENT AND PLAN  46 y.o. year old female  has a past medical history of Hypertension; Asthma; Depression; Suicide attempt ([place]); Mental retardation, mild (I.Q. 50-70); Hyperlipidemia; and Migraine. here to follow-up. She has less than one headache per month, currently relieved with Imitrex.  Continue Imitrex acutely for migraine, will refill Continue record migraines with migraine tracker APP Follow-up yearly and when necessary Dennie Bible, [facility], [facility], APRN  [facility] [address], [place], [place] [address] [phone]

## 2017-09-28 ENCOUNTER — Other Ambulatory Visit: Payer: Self-pay | Admitting: Family

## 2017-09-28 DIAGNOSIS — E785 Hyperlipidemia, unspecified: Secondary | ICD-10-CM

## 2017-10-30 ENCOUNTER — Other Ambulatory Visit: Payer: Self-pay | Admitting: Family

## 2017-10-30 DIAGNOSIS — E785 Hyperlipidemia, unspecified: Secondary | ICD-10-CM

## 2017-11-12 ENCOUNTER — Other Ambulatory Visit: Payer: Self-pay | Admitting: Family

## 2017-11-12 DIAGNOSIS — E785 Hyperlipidemia, unspecified: Secondary | ICD-10-CM

## 2017-11-24 ENCOUNTER — Other Ambulatory Visit: Payer: Self-pay | Admitting: Family

## 2017-11-24 DIAGNOSIS — E785 Hyperlipidemia, unspecified: Secondary | ICD-10-CM

## 2017-11-25 NOTE — Telephone Encounter (Signed)
Last lipid 04/16/16  Kelli Walls

## 2017-12-10 ENCOUNTER — Encounter: Payer: Self-pay | Admitting: Family

## 2017-12-10 ENCOUNTER — Ambulatory Visit: Payer: Medicaid Other | Admitting: Family

## 2017-12-10 VITALS — BP 140/99 | HR 89 | Temp 97.0°F | Ht 62.0 in | Wt 246.6 lb

## 2017-12-10 DIAGNOSIS — R609 Edema, unspecified: Secondary | ICD-10-CM | POA: Diagnosis not present

## 2017-12-10 DIAGNOSIS — E782 Mixed hyperlipidemia: Secondary | ICD-10-CM

## 2017-12-10 DIAGNOSIS — I10 Essential (primary) hypertension: Secondary | ICD-10-CM | POA: Diagnosis not present

## 2017-12-10 DIAGNOSIS — F331 Major depressive disorder, recurrent, moderate: Secondary | ICD-10-CM

## 2017-12-10 MED ORDER — AMLODIPINE BESYLATE 5 MG PO TABS: 5.0000 mg | ORAL_TABLET | Freq: Every day | ORAL | 3 refills | Status: DC

## 2017-12-10 MED ORDER — ATORVASTATIN CALCIUM 40 MG PO TABS: ORAL_TABLET | ORAL | 1 refills | Status: DC

## 2017-12-10 MED ORDER — LOSARTAN POTASSIUM-HCTZ 100-25 MG PO TABS: 1.0000 | ORAL_TABLET | Freq: Every day | ORAL | 11 refills | Status: DC

## 2017-12-10 NOTE — Addendum Note (Signed)
Addended by: Evelina Dun A on: 12/10/2017 02:44 PM   Modules accepted: Orders

## 2017-12-10 NOTE — Patient Instructions (Addendum)

## 2017-12-10 NOTE — Progress Notes (Addendum)
Subjective:    Patient ID: Kelli Walls, female    DOB: Jul 13, 1971, 47 y.o.   MRN: [id number]  Chief Complaint  Patient presents with  . right ankle swollen    with pain   Pt presents to the office with mother. Requesting referral to Bariatric Surgery. States she has tried losing weight on her own, but continues to gain weight.  Ankle Pain   The incident occurred more than 1 week ago. There was no injury mechanism. The pain is present in the right ankle. The quality of the pain is described as aching. The pain is at a severity of 1/10. The pain is mild. She reports no foreign bodies present. The symptoms are aggravated by weight bearing. She has tried acetaminophen and rest for the symptoms. The treatment provided mild relief.  Hypertension  This is a chronic problem. The current episode started more than 1 year ago. The problem has been resolved since onset. The problem is uncontrolled. Associated symptoms include malaise/fatigue and peripheral edema. Pertinent negatives include no blurred vision, chest pain or shortness of breath. Risk factors for coronary artery disease include dyslipidemia, family history and obesity. The current treatment provides mild improvement. There is no history of kidney disease or CAD/MI.  Hyperlipidemia  This is a chronic problem. The current episode started more than 1 year ago. The problem is uncontrolled. Recent lipid tests were reviewed and are high. Exacerbating diseases include obesity. Pertinent negatives include no chest pain or shortness of breath. Current antihyperlipidemic treatment includes statins. The current treatment provides mild improvement of lipids. Risk factors for coronary artery disease include dyslipidemia, family history, hypertension and a sedentary lifestyle.  Depression         This is a chronic problem.  The current episode started more than 1 year ago.   The onset quality is gradual.   The problem occurs constantly.  The problem has been  waxing and waning since onset.  Associated symptoms include irritable, decreased interest and sad.  Associated symptoms include no helplessness and no hopelessness.  Past treatments include SSRIs - Selective serotonin reuptake inhibitors.  Compliance with treatment is variable.     Review of Systems  Constitutional: Positive for malaise/fatigue.  Eyes: Negative for blurred vision.  Respiratory: Negative for shortness of breath.   Cardiovascular: Negative for chest pain.  Psychiatric/Behavioral: Positive for depression.  All other systems reviewed and are negative.      Objective:   Physical Exam  Constitutional: She is oriented to person, place, and time. She appears well-developed and well-nourished. She is irritable. No distress.  Morbid obese   HENT:  Head: Normocephalic and atraumatic.  Right Ear: External ear normal.  Left Ear: External ear normal.  Mouth/Throat: Oropharynx is clear and moist.  Eyes: Pupils are equal, round, and reactive to light.  Neck: Normal range of motion. Neck supple. No thyromegaly present.  Cardiovascular: Normal rate, regular rhythm, normal heart sounds and intact distal pulses.  No murmur heard. Pulmonary/Chest: Effort normal and breath sounds normal. No respiratory distress. She has no wheezes.  Abdominal: Soft. Bowel sounds are normal. She exhibits no distension. There is no tenderness.  Musculoskeletal: Normal range of motion. She exhibits edema (trace bilateral ankle). She exhibits no tenderness.  Neurological: She is alert and oriented to person, place, and time. She has normal reflexes. No cranial nerve deficit.  Skin: Skin is warm and dry.  Psychiatric: She has a normal mood and affect. Her behavior is normal. Judgment and thought content  normal.  Vitals reviewed.       BP (!) 140/99   Pulse 89   Temp (!) 97 F (36.1 C) (Oral)   Ht 5' 2" (1.575 m)   Wt 246 lb 9.6 oz (111.9 kg)   LMP 09/07/2014   BMI 45.10 kg/m   Assessment &  Plan:  Kelli Walls comes in today with chief complaint of right ankle swollen (with pain)   Diagnosis and orders addressed:  1. Essential hypertension Will reorder Norvasc 5 mg - amLODipine (NORVASC) 5 MG tablet; Take 1 tablet (5 mg total) by mouth daily.  Dispense: 90 tablet; Refill: 3 - losartan-hydrochlorothiazide (HYZAAR) 100-25 MG tablet; Take 1 tablet by mouth daily.  Dispense: 30 tablet; Refill: 11 - CMP14+EGFR  2. Obesity, morbid (HCC) - CMP14+EGFR  3. Mixed hyperlipidemia - atorvastatin (LIPITOR) 40 MG tablet; TAKE 1 TABLET BY MOUTH ONCE DAILY (MUST BE SEEN BEFORE NEXT REFILL)  Dispense: 90 tablet; Refill: 1 - CMP14+EGFR - Lipid panel  4. Moderate episode of recurrent major depressive disorder (HCC) - CMP14+EGFR  5. Peripheral edema Low salt diet - Compression stockings   Labs pending Health Maintenance reviewed Diet and exercise encouraged  Follow up plan: 1 month     , FNP   

## 2017-12-11 ENCOUNTER — Telehealth: Payer: Self-pay | Admitting: Family

## 2017-12-11 DIAGNOSIS — M25511 Pain in right shoulder: Secondary | ICD-10-CM

## 2017-12-11 DIAGNOSIS — S43401A Unspecified sprain of right shoulder joint, initial encounter: Secondary | ICD-10-CM

## 2017-12-11 LAB — CMP14+EGFR
ALK PHOS: 53 IU/L (ref 39–117)
ALT: 34 IU/L — ABNORMAL HIGH (ref 0–32)
AST: 36 IU/L (ref 0–40)
Albumin/Globulin Ratio: 1.3 (ref 1.2–2.2)
Albumin: 4 g/dL (ref 3.5–5.5)
BUN/Creatinine Ratio: 12 (ref 9–23)
BUN: 10 mg/dL (ref 6–24)
Bilirubin Total: 0.3 mg/dL (ref 0.0–1.2)
CO2: 24 mmol/L (ref 20–29)
CREATININE: 0.83 mg/dL (ref 0.57–1.00)
Calcium: 8.8 mg/dL (ref 8.7–10.2)
Chloride: 101 mmol/L (ref 96–106)
GFR calc Af Amer: 98 mL/min/{1.73_m2} (ref >59)
GFR calc non Af Amer: 85 mL/min/{1.73_m2} (ref >59)
GLOBULIN, TOTAL: 3.1 g/dL (ref 1.5–4.5)
GLUCOSE: 82 mg/dL (ref 65–99)
Potassium: 3.5 mmol/L (ref 3.5–5.2)
SODIUM: 138 mmol/L (ref 134–144)
Total Protein: 7.1 g/dL (ref 6.0–8.5)

## 2017-12-11 LAB — LIPID PANEL
CHOLESTEROL TOTAL: 135 mg/dL (ref 100–199)
Chol/HDL Ratio: 2.8 ratio (ref 0.0–4.4)
HDL: 49 mg/dL (ref >39)
LDL CALC: 70 mg/dL (ref 0–99)
TRIGLYCERIDES: 81 mg/dL (ref 0–149)
VLDL Cholesterol Cal: 16 mg/dL (ref 5–40)

## 2017-12-12 MED ORDER — NAPROXEN 500 MG PO TABS: 500.0000 mg | ORAL_TABLET | Freq: Two times a day (BID) | ORAL | 1 refills | Status: DC

## 2017-12-12 NOTE — Telephone Encounter (Signed)
Naprosyn Prescription sent to pharmacy.

## 2017-12-12 NOTE — Telephone Encounter (Signed)
Pt notified of RX Verbalizes understanding 

## 2017-12-20 ENCOUNTER — Encounter: Payer: Self-pay | Admitting: *Deleted

## 2018-01-08 ENCOUNTER — Ambulatory Visit: Payer: Medicaid Other | Admitting: Family

## 2018-01-08 ENCOUNTER — Encounter: Payer: Self-pay | Admitting: Family

## 2018-01-08 VITALS — BP 130/92 | HR 101 | Temp 98.9°F | Ht 62.0 in | Wt 243.0 lb

## 2018-01-08 DIAGNOSIS — I10 Essential (primary) hypertension: Secondary | ICD-10-CM | POA: Diagnosis not present

## 2018-01-08 DIAGNOSIS — M25471 Effusion, right ankle: Secondary | ICD-10-CM

## 2018-01-08 NOTE — Progress Notes (Signed)
   Subjective:    Patient ID: Kelli Walls, female    DOB: 1971/02/24, 47 y.o.   MRN: [id number]  Chief Complaint  Patient presents with  . Edema    recheck    Hypertension  This is a chronic problem. The current episode started more than 1 year ago. The problem has been waxing and waning since onset. The problem is uncontrolled. Associated symptoms include peripheral edema. Pertinent negatives include no headaches, malaise/fatigue or shortness of breath. Risk factors for coronary artery disease include dyslipidemia, obesity and sedentary lifestyle. The current treatment provides mild improvement. There is no history of kidney disease, CAD/MI or heart failure.  Ankle Pain   The incident occurred at work. Injury mechanism: broke right ankle 30 years ago. The pain is present in the right ankle. The quality of the pain is described as aching. The pain is at a severity of 5/10. The pain is mild. Pertinent negatives include no numbness or tingling. She reports no foreign bodies present. She has tried NSAIDs and rest for the symptoms. The treatment provided mild relief.      Review of Systems  Constitutional: Negative for malaise/fatigue.  Respiratory: Negative for shortness of breath.   Neurological: Negative for tingling, numbness and headaches.  All other systems reviewed and are negative.      Objective:   Physical Exam  Constitutional: She is oriented to person, place, and time. She appears well-developed and well-nourished. No distress.  HENT:  Head: Normocephalic and atraumatic.  Right Ear: External ear normal.  Left Ear: External ear normal.  Mouth/Throat: Oropharynx is clear and moist.  Eyes: Pupils are equal, round, and reactive to light.  Neck: Normal range of motion. Neck supple. No thyromegaly present.  Cardiovascular: Normal rate, regular rhythm, normal heart sounds and intact distal pulses.  No murmur heard. Pulmonary/Chest: Effort normal and breath sounds normal. No  respiratory distress. She has no wheezes.  Abdominal: Soft. Bowel sounds are normal. She exhibits no distension. There is no tenderness.  Musculoskeletal: Normal range of motion. She exhibits edema (trace in right ankle). She exhibits no tenderness.  Neurological: She is alert and oriented to person, place, and time. She has normal reflexes. No cranial nerve deficit.  Skin: Skin is warm and dry.  Psychiatric: She has a normal mood and affect. Her behavior is normal. Judgment and thought content normal.  Vitals reviewed.     BP (!) 130/92   Pulse (!) 101   Temp 98.9 F (37.2 C) (Oral)   Ht _0  (1.575 m)   Wt 243 lb (110.2 kg)   LMP 09/07/2014   BMI 44.45 kg/m      Assessment & Plan:  1. Essential hypertension -Dash diet information given -Exercise encouraged - Stress Management  -Continue current meds -RTO in 3 months - BMP8+EGFR  2. Right ankle swelling Compression  Keep elevated Low salt diet - BMP8+EGFR    Evelina Dun, FNP

## 2018-01-08 NOTE — Patient Instructions (Signed)

## 2018-01-09 LAB — BMP8+EGFR
BUN/Creatinine Ratio: 15 (ref 9–23)
BUN: 16 mg/dL (ref 6–24)
CALCIUM: 9.4 mg/dL (ref 8.7–10.2)
CHLORIDE: 102 mmol/L (ref 96–106)
CO2: 26 mmol/L (ref 20–29)
Creatinine, Ser: 1.08 mg/dL — ABNORMAL HIGH (ref 0.57–1.00)
GFR calc non Af Amer: 62 mL/min/{1.73_m2} (ref >59)
GFR, EST AFRICAN AMERICAN: 72 mL/min/{1.73_m2} (ref >59)
Glucose: 90 mg/dL (ref 65–99)
POTASSIUM: 3.5 mmol/L (ref 3.5–5.2)
Sodium: 143 mmol/L (ref 134–144)

## 2018-01-10 ENCOUNTER — Ambulatory Visit: Payer: Medicaid Other | Admitting: Family

## 2018-05-08 ENCOUNTER — Other Ambulatory Visit: Payer: Self-pay | Admitting: Family

## 2018-05-08 DIAGNOSIS — Z1231 Encounter for screening mammogram for malignant neoplasm of breast: Secondary | ICD-10-CM

## 2018-06-10 ENCOUNTER — Ambulatory Visit
Admission: RE | Admit: 2018-06-10 | Discharge: 2018-06-10 | Disposition: A | Discharge status: Home / Self Care | Payer: Medicaid Other | Source: Ambulatory Visit | Attending: Family | Admitting: Family

## 2018-06-10 DIAGNOSIS — Z1231 Encounter for screening mammogram for malignant neoplasm of breast: Secondary | ICD-10-CM

## 2018-06-30 ENCOUNTER — Telehealth: Payer: Self-pay | Admitting: *Deleted

## 2018-06-30 MED ORDER — HYDROCHLOROTHIAZIDE 25 MG PO TABS: 25.0000 mg | ORAL_TABLET | Freq: Every day | ORAL | 3 refills | Status: DC

## 2018-06-30 MED ORDER — LOSARTAN POTASSIUM 100 MG PO TABS: 100.0000 mg | ORAL_TABLET | Freq: Every day | ORAL | 3 refills | Status: DC

## 2018-06-30 NOTE — Telephone Encounter (Signed)
Fax from [facility] Losartan/HCTZ 100-25 mg tab is on backorder If appropriate send in new Rxs separately for Losartan 100 & HCTZ 25

## 2018-06-30 NOTE — Addendum Note (Signed)
Addended by: Evelina Dun A on: 06/30/2018 02:25 PM   Modules accepted: Orders

## 2018-07-17 ENCOUNTER — Ambulatory Visit: Payer: Medicaid Other | Admitting: Family

## 2018-07-17 ENCOUNTER — Encounter: Payer: Self-pay | Admitting: Family

## 2018-07-17 VITALS — BP 151/110 | HR 93 | Temp 98.6°F | Ht 62.0 in | Wt 257.8 lb

## 2018-07-17 DIAGNOSIS — M25572 Pain in left ankle and joints of left foot: Secondary | ICD-10-CM

## 2018-07-17 DIAGNOSIS — Z23 Encounter for immunization: Secondary | ICD-10-CM

## 2018-07-17 DIAGNOSIS — D229 Melanocytic nevi, unspecified: Secondary | ICD-10-CM

## 2018-07-17 DIAGNOSIS — I10 Essential (primary) hypertension: Secondary | ICD-10-CM

## 2018-07-17 DIAGNOSIS — G8929 Other chronic pain: Secondary | ICD-10-CM

## 2018-07-17 MED ORDER — DICLOFENAC SODIUM 75 MG PO TBEC: 75.0000 mg | DELAYED_RELEASE_TABLET | Freq: Two times a day (BID) | ORAL | 0 refills | Status: DC

## 2018-07-17 MED ORDER — LOSARTAN POTASSIUM 100 MG PO TABS: 100.0000 mg | ORAL_TABLET | Freq: Every day | ORAL | 3 refills | Status: DC

## 2018-07-17 MED ORDER — HYDROCHLOROTHIAZIDE 25 MG PO TABS: 25.0000 mg | ORAL_TABLET | Freq: Every day | ORAL | 3 refills | Status: DC

## 2018-07-17 MED ORDER — METOPROLOL SUCCINATE ER 100 MG PO TB24: 100.0000 mg | ORAL_TABLET | Freq: Every day | ORAL | 3 refills | Status: DC

## 2018-07-17 MED ORDER — AMLODIPINE BESYLATE 5 MG PO TABS: 5.0000 mg | ORAL_TABLET | Freq: Every day | ORAL | 3 refills | Status: DC

## 2018-07-17 NOTE — Progress Notes (Signed)
Subjective:    Patient ID: Kelli Walls, female    DOB: 30-Apr-1971, 47 y.o.   MRN: [id number]  Chief Complaint  Patient presents with  . left ankle pain  . wants referral to dermatology   Pt presents to the office today with complaints of left ankle pain and requesting referral to dermatologists. States she has noticed "black bumps" on her back and upper shoulder over a month ago. States the area is pruritic.   Her blood pressure is elevated today. She states she did not take all of her blood pressure medications today because she was "rushing". She is unsure which medication she did not take, but thinks it may be the amlodipine.  Hypertension  This is a chronic problem. The current episode started more than 1 year ago. The problem has been waxing and waning since onset. The problem is uncontrolled. Associated symptoms include malaise/fatigue and peripheral edema. Pertinent negatives include no headaches or shortness of breath. The current treatment provides no improvement. There is no history of kidney disease or CAD/MI.  Ankle Pain   The incident occurred more than 1 week ago. There was no injury mechanism (broke her left ankle when she was 14). The pain is present in the left ankle. The pain is at a severity of 6/10. The pain is moderate. The pain has been intermittent since onset. She reports no foreign bodies present. The symptoms are aggravated by weight bearing. She has tried ice and rest for the symptoms. The treatment provided mild relief.      Review of Systems  Constitutional: Positive for malaise/fatigue.  Respiratory: Negative for shortness of breath.   Neurological: Negative for headaches.  All other systems reviewed and are negative.      Objective:   Physical Exam Vitals signs reviewed.  Constitutional:      General: She is not in acute distress.    Appearance: She is well-developed.  HENT:     Head: Normocephalic and atraumatic.  Eyes:     Pupils: Pupils are  equal, round, and reactive to light.  Neck:     Musculoskeletal: Normal range of motion and neck supple.     Thyroid: No thyromegaly.  Cardiovascular:     Rate and Rhythm: Normal rate and regular rhythm.     Heart sounds: Normal heart sounds. No murmur.  Pulmonary:     Effort: Pulmonary effort is normal. No respiratory distress.     Breath sounds: Normal breath sounds. No wheezing.  Abdominal:     General: Bowel sounds are normal. There is no distension.     Palpations: Abdomen is soft.     Tenderness: There is no abdominal tenderness.  Musculoskeletal: Normal range of motion.        General: No tenderness.     Right lower leg: Edema (2+ ) present.     Left lower leg: Edema (1+) present.  Skin:    General: Skin is warm and dry.     Comments: Scattered benign appearing circular moles on back  Neurological:     Mental Status: She is alert and oriented to person, place, and time.     Cranial Nerves: No cranial nerve deficit.     Deep Tendon Reflexes: Reflexes are normal and symmetric.  Psychiatric:        Behavior: Behavior normal.        Thought Content: Thought content normal.        Judgment: Judgment normal.       BP Marland Kitchen)  151/110   Pulse 93   Temp 98.6 F (37 C) (Oral)   Ht 5\' 2"  (1.575 m)   Wt 257 lb 12.8 oz (116.9 kg)   LMP 09/07/2014   BMI 47.15 kg/m      Assessment & Plan:  Kelli Walls comes in today with chief complaint of left ankle pain and wants referral to dermatology   Diagnosis and orders addressed:  1. Essential hypertension I will reorder all of her blood pressure medication today. Discussed importance of taking daily -Dash diet information given -Exercise encouraged - Stress Management  -Continue current meds -RTO in 1 week - amLODipine (NORVASC) 5 MG tablet; Take 1 tablet (5 mg total) by mouth daily.  Dispense: 90 tablet; Refill: 3 - hydrochlorothiazide (HYDRODIURIL) 25 MG tablet; Take 1 tablet (25 mg total) by mouth daily.  Dispense: 90  tablet; Refill: 3 - losartan (COZAAR) 100 MG tablet; Take 1 tablet (100 mg total) by mouth daily.  Dispense: 90 tablet; Refill: 3 - metoprolol succinate (TOPROL-XL) 100 MG 24 hr tablet; Take 1 tablet (100 mg total) by mouth daily. Take with or immediately following a meal.  Dispense: 90 tablet; Refill: 3  2. Obesity, morbid ([place])  3. Chronic pain of left ankle Rest Ice Compression hose Will start Voltaren BID with food as needed, No other NSAIDs - diclofenac (VOLTAREN) 75 MG EC tablet; Take 1 tablet (75 mg total) by mouth 2 (two) times daily.  Dispense: 30 tablet; Refill: 0 - Compression stockings  4. Benign skin mole Discussed the findings. States she would still like a referral to see a dermatologists  - Ambulatory referral to Dermatology   Labs pending Health Maintenance reviewed Diet and exercise encouraged  Follow up plan: 1 week to recheck HTN  Evelina Dun, FNP

## 2018-07-17 NOTE — Patient Instructions (Signed)

## 2018-07-18 LAB — BMP8+EGFR
BUN/Creatinine Ratio: 15 (ref 9–23)
BUN: 16 mg/dL (ref 6–24)
CO2: 29 mmol/L (ref 20–29)
Calcium: 9.7 mg/dL (ref 8.7–10.2)
Chloride: 95 mmol/L — ABNORMAL LOW (ref 96–106)
Creatinine, Ser: 1.04 mg/dL — ABNORMAL HIGH (ref 0.57–1.00)
GFR, EST AFRICAN AMERICAN: 75 mL/min/{1.73_m2} (ref >59)
GFR, EST NON AFRICAN AMERICAN: 65 mL/min/{1.73_m2} (ref >59)
Glucose: 75 mg/dL (ref 65–99)
Potassium: 3.4 mmol/L — ABNORMAL LOW (ref 3.5–5.2)
Sodium: 137 mmol/L (ref 134–144)

## 2018-07-22 ENCOUNTER — Telehealth: Payer: Self-pay

## 2018-07-22 MED ORDER — NAPROXEN 500 MG PO TABS: 500.0000 mg | ORAL_TABLET | Freq: Two times a day (BID) | ORAL | 1 refills | Status: DC

## 2018-07-22 NOTE — Telephone Encounter (Signed)
Medicaid non preferred Diclofenac tablets  Preferred are Ibuprofen tab., indomethacin cap., ketorolac tab., meloxicam tab., naproxen EC tab., sulindac tab.

## 2018-07-24 ENCOUNTER — Ambulatory Visit: Payer: Medicaid Other | Admitting: Family

## 2018-07-24 ENCOUNTER — Encounter: Payer: Self-pay | Admitting: Family

## 2018-07-24 VITALS — BP 141/99 | HR 82 | Temp 98.6°F | Ht 62.0 in | Wt 260.2 lb

## 2018-07-24 DIAGNOSIS — I10 Essential (primary) hypertension: Secondary | ICD-10-CM

## 2018-07-24 MED ORDER — AMLODIPINE BESYLATE 10 MG PO TABS: 10.0000 mg | ORAL_TABLET | Freq: Every day | ORAL | 3 refills | Status: DC

## 2018-07-24 NOTE — Progress Notes (Signed)
Acute Office Visit  Subjective:    Patient ID: Kelli Walls, female    DOB: 12-27-1970, 47 y.o.   MRN: [id number]  Chief Complaint  Patient presents with  . Hypertension    PT presents to the office today to recheck HTN. PT's BP is not at goal.  Hypertension  This is a chronic problem. The current episode started more than 1 year ago. The problem has been waxing and waning since onset. The problem is uncontrolled. Associated symptoms include malaise/fatigue. Pertinent negatives include no headaches or shortness of breath. Risk factors for coronary artery disease include dyslipidemia, obesity and sedentary lifestyle. The current treatment provides mild improvement. There is no history of kidney disease, CAD/MI, CVA or heart failure.    Review of Systems  All other systems reviewed and are negative.      Objective:    Physical Exam  Constitutional: Kelli Walls is oriented to person, place, and time. Kelli Walls appears well-developed and well-nourished. No distress.  HENT:  Head: Normocephalic and atraumatic.  Right Ear: External ear normal.  Left Ear: External ear normal.  Nose: Nose normal.  Mouth/Throat: Oropharynx is clear and moist.  Eyes: Pupils are equal, round, and reactive to light.  Neck: Normal range of motion. Neck supple. No thyromegaly present.  Cardiovascular: Normal rate, regular rhythm, normal heart sounds and intact distal pulses.  No murmur heard. Pulmonary/Chest: Effort normal and breath sounds normal. No respiratory distress. Kelli Walls has no wheezes.  Abdominal: Soft. Bowel sounds are normal. Kelli Walls exhibits no distension. There is no abdominal tenderness.  Musculoskeletal: Normal range of motion.        General: No tenderness or edema.  Neurological: Kelli Walls is alert and oriented to person, place, and time. Kelli Walls has normal reflexes. No cranial nerve deficit.  Skin: Skin is warm and dry.  Psychiatric: Kelli Walls has a normal mood and affect. Her behavior is normal. Judgment and thought  content normal.  Vitals reviewed.   BP (!) 149/105   Pulse (!) 107   Temp 98.6 F (37 C) (Oral)   Ht 5' 2"  (1.575 m)   Wt 260 lb 3.2 oz (118 kg)   LMP 09/07/2014   BMI 47.59 kg/m  Wt Readings from Last 3 Encounters:  07/24/18 260 lb 3.2 oz (118 kg)  07/17/18 257 lb 12.8 oz (116.9 kg)  01/08/18 243 lb (110.2 kg)    Health Maintenance Due  Topic Date Due  . PAP SMEAR-Modifier  12/05/2016    There are no preventive care reminders to display for this patient.   No results found for: TSH Lab Results  Component Value Date   WBC 8.5 05/22/2013   HGB 12.1 05/22/2013   HCT 35.8 (L) 05/22/2013   MCV 85.6 05/22/2013   PLT 280 05/22/2013   Lab Results  Component Value Date   NA 137 07/17/2018   K 3.4 (L) 07/17/2018   CO2 29 07/17/2018   GLUCOSE 75 07/17/2018   BUN 16 07/17/2018   CREATININE 1.04 (H) 07/17/2018   BILITOT 0.3 12/10/2017   ALKPHOS 53 12/10/2017   AST 36 12/10/2017   ALT 34 (H) 12/10/2017   PROT 7.1 12/10/2017   ALBUMIN 4.0 12/10/2017   CALCIUM 9.7 07/17/2018   Lab Results  Component Value Date   CHOL 135 12/10/2017   Lab Results  Component Value Date   HDL 49 12/10/2017   Lab Results  Component Value Date   LDLCALC 70 12/10/2017   Lab Results  Component Value Date   TRIG  81 12/10/2017   Lab Results  Component Value Date   CHOLHDL 2.8 12/10/2017   Lab Results  Component Value Date   HGBA1C 5.3% 12/19/2012       Assessment & Plan:   Problem List Items Addressed This Visit    None     Larya Charpentier comes in today with chief complaint of Hypertension   Diagnosis and orders addressed:  1. Essential hypertension We will increase Norvasc to 10 mg from 5 mg -Dash diet information given -Exercise encouraged - Stress Management  -Continue current meds -RTO in 2 weeks  - amLODipine (NORVASC) 10 MG tablet; Take 1 tablet (10 mg total) by mouth daily.  Dispense: 90 tablet; Refill: 3 - BMP8+EGFR     No orders of the defined types  were placed in this encounter.    Evelina Dun, FNP

## 2018-07-24 NOTE — Progress Notes (Deleted)
   Subjective:    Patient ID: Kelli Walls, female    DOB: [birth year], 47 y.o.   MRN: [id number]  HPI    Review of Systems     Objective:   Physical Exam        Assessment & Plan:

## 2018-07-24 NOTE — Patient Instructions (Signed)

## 2018-07-25 LAB — BMP8+EGFR
BUN/Creatinine Ratio: 12 (ref 9–23)
BUN: 12 mg/dL (ref 6–24)
CO2: 28 mmol/L (ref 20–29)
Calcium: 9.6 mg/dL (ref 8.7–10.2)
Chloride: 96 mmol/L (ref 96–106)
Creatinine, Ser: 0.98 mg/dL (ref 0.57–1.00)
GFR calc Af Amer: 81 mL/min/{1.73_m2} (ref >59)
GFR calc non Af Amer: 70 mL/min/{1.73_m2} (ref >59)
GLUCOSE: 133 mg/dL — AB (ref 65–99)
Potassium: 3.5 mmol/L (ref 3.5–5.2)
Sodium: 138 mmol/L (ref 134–144)

## 2018-08-14 ENCOUNTER — Ambulatory Visit: Payer: Medicaid Other | Admitting: Family

## 2018-08-22 ENCOUNTER — Encounter: Payer: Self-pay | Admitting: Family

## 2018-08-22 ENCOUNTER — Ambulatory Visit: Payer: Medicaid Other | Admitting: Family

## 2018-08-22 VITALS — BP 133/93 | HR 91 | Temp 97.7°F | Ht 62.0 in | Wt 254.0 lb

## 2018-08-22 DIAGNOSIS — B373 Candidiasis of vulva and vagina: Secondary | ICD-10-CM

## 2018-08-22 DIAGNOSIS — N949 Unspecified condition associated with female genital organs and menstrual cycle: Secondary | ICD-10-CM | POA: Diagnosis not present

## 2018-08-22 DIAGNOSIS — B3731 Acute candidiasis of vulva and vagina: Secondary | ICD-10-CM

## 2018-08-22 DIAGNOSIS — N9489 Other specified conditions associated with female genital organs and menstrual cycle: Secondary | ICD-10-CM

## 2018-08-22 DIAGNOSIS — I10 Essential (primary) hypertension: Secondary | ICD-10-CM

## 2018-08-22 LAB — BMP8+EGFR
BUN/Creatinine Ratio: 12 (ref 9–23)
BUN: 13 mg/dL (ref 6–24)
CALCIUM: 9.6 mg/dL (ref 8.7–10.2)
CO2: 26 mmol/L (ref 20–29)
Chloride: 95 mmol/L — ABNORMAL LOW (ref 96–106)
Creatinine, Ser: 1.09 mg/dL — ABNORMAL HIGH (ref 0.57–1.00)
GFR calc non Af Amer: 61 mL/min/{1.73_m2} (ref >59)
GFR, EST AFRICAN AMERICAN: 71 mL/min/{1.73_m2} (ref >59)
Glucose: 106 mg/dL — ABNORMAL HIGH (ref 65–99)
Potassium: 3 mmol/L — ABNORMAL LOW (ref 3.5–5.2)
Sodium: 138 mmol/L (ref 134–144)

## 2018-08-22 LAB — WET PREP FOR TRICH, YEAST, CLUE
Clue Cell Exam: NEGATIVE
Trichomonas Exam: NEGATIVE
Yeast Exam: POSITIVE — AB

## 2018-08-22 MED ORDER — FLUCONAZOLE 150 MG PO TABS: 150.0000 mg | ORAL_TABLET | ORAL | 0 refills | Status: DC | PRN

## 2018-08-22 NOTE — Progress Notes (Signed)
Subjective:    Patient ID: Kelli Walls, female    DOB: [birth year], 48 y.o.   MRN: [id number]  Chief Complaint  Patient presents with  . Hypertension    two week recheck  . vaginal burning    Hypertension  This is a chronic problem. The current episode started more than 1 year ago. The problem has been waxing and waning since onset. The problem is uncontrolled. Associated symptoms include malaise/fatigue. Pertinent negatives include no headaches, peripheral edema or shortness of breath. Risk factors for coronary artery disease include obesity and dyslipidemia. The current treatment provides mild improvement.  Vaginal Discharge  The patient's primary symptoms include genital itching and vaginal discharge. This is a new problem. The current episode started yesterday. The problem occurs intermittently. The pain is mild. Associated symptoms include dysuria. Pertinent negatives include no chills, constipation, diarrhea, headaches or hematuria. The vaginal discharge was yellow.      Review of Systems  Constitutional: Positive for malaise/fatigue. Negative for chills.  Respiratory: Negative for shortness of breath.   Gastrointestinal: Negative for constipation and diarrhea.  Genitourinary: Positive for dysuria and vaginal discharge. Negative for hematuria.  Neurological: Negative for headaches.  All other systems reviewed and are negative.      Objective:   Physical Exam Vitals signs reviewed.  Constitutional:      General: She is not in acute distress.    Appearance: She is well-developed.  HENT:     Head: Normocephalic and atraumatic.  Eyes:     Pupils: Pupils are equal, round, and reactive to light.  Neck:     Musculoskeletal: Normal range of motion and neck supple.     Thyroid: No thyromegaly.  Cardiovascular:     Rate and Rhythm: Normal rate and regular rhythm.     Heart sounds: Normal heart sounds. No murmur.  Pulmonary:     Effort: Pulmonary effort is normal. No  respiratory distress.     Breath sounds: Normal breath sounds. No wheezing.  Abdominal:     General: Bowel sounds are normal. There is no distension.     Palpations: Abdomen is soft.     Tenderness: There is no abdominal tenderness.  Musculoskeletal: Normal range of motion.        General: No tenderness.     Right lower leg: Edema (trace) present.     Left lower leg: Edema (trace) present.  Skin:    General: Skin is warm and dry.  Neurological:     Mental Status: She is alert and oriented to person, place, and time.     Cranial Nerves: No cranial nerve deficit.     Deep Tendon Reflexes: Reflexes are normal and symmetric.  Psychiatric:        Behavior: Behavior normal.        Thought Content: Thought content normal.        Judgment: Judgment normal.       BP (!) 149/102   Pulse (!) 106   Temp 97.7 F (36.5 C) (Oral)   Ht 5' 2"  (1.575 m)   Wt 254 lb (115.2 kg)   LMP 09/07/2014   BMI 46.46 kg/m      Assessment & Plan:  Grete Bosko comes in today with chief complaint of Hypertension (two week recheck) and vaginal burning   Diagnosis and orders addressed:  1. Vaginal burning - WET PREP FOR TRICH, YEAST, CLUE - BMP8+EGFR  2. Essential hypertension -Dash diet information given -Exercise encouraged - Stress Management  -Continue current  meds - BMP8+EGFR  3. Vaginal candidiasis Keep clean and dry - fluconazole (DIFLUCAN) 150 MG tablet; Take 1 tablet (150 mg total) by mouth every three (3) days as needed.  Dispense: 3 tablet; Refill: 0 - BMP8+EGFR   Evelina Dun, FNP

## 2018-08-22 NOTE — Patient Instructions (Signed)
Vaginal Yeast infection, Adult    Vaginal yeast infection is a condition that causes vaginal discharge as well as soreness, swelling, and redness (inflammation) of the vagina. This is a common condition. Some women get this infection frequently.  What are the causes?  This condition is caused by a change in the normal balance of the yeast (candida) and bacteria that live in the vagina. This change causes an overgrowth of yeast, which causes the inflammation.  What increases the risk?  The condition is more likely to develop in women who:   Take antibiotic medicines.   Have diabetes.   Take birth control pills.   Are pregnant.   Douche often.   Have a weak body defense system (immune system).   Have been taking steroid medicines for a long time.   Frequently wear tight clothing.  What are the signs or symptoms?  Symptoms of this condition include:   White, thick, creamy vaginal discharge.   Swelling, itching, redness, and irritation of the vagina. The lips of the vagina (vulva) may be affected as well.   Pain or a burning feeling while urinating.   Pain during sex.  How is this diagnosed?  This condition is diagnosed based on:   Your medical history.   A physical exam.   A pelvic exam. Your health care provider will examine a sample of your vaginal discharge under a microscope. Your health care provider may send this sample for testing to confirm the diagnosis.  How is this treated?  This condition is treated with medicine. Medicines may be over-the-counter or prescription. You may be told to use one or more of the following:   Medicine that is taken by mouth (orally).   Medicine that is applied as a cream (topically).   Medicine that is inserted directly into the vagina (suppository).  Follow these instructions at home:    Lifestyle   Do not have sex until your health care provider approves. Tell your sex partner that you have a yeast infection. That person should go to his or her health care  provider and ask if they should also be treated.   Do not wear tight clothes, such as pantyhose or tight pants.   Wear breathable cotton underwear.  General instructions   Take or apply over-the-counter and prescription medicines only as told by your health care provider.   Eat more yogurt. This may help to keep your yeast infection from returning.   Do not use tampons until your health care provider approves.   Try taking a sitz bath to help with discomfort. This is a warm water bath that is taken while you are sitting down. The water should only come up to your hips and should cover your buttocks. Do this 3-4 times per day or as told by your health care provider.   Do not douche.   If you have diabetes, keep your blood sugar levels under control.   Keep all follow-up visits as told by your health care provider. This is important.  Contact a health care provider if:   You have a fever.   Your symptoms go away and then return.   Your symptoms do not get better with treatment.   Your symptoms get worse.   You have new symptoms.   You develop blisters in or around your vagina.   You have blood coming from your vagina and it is not your menstrual period.   You develop pain in your abdomen.  Summary     Vaginal yeast infection is a condition that causes discharge as well as soreness, swelling, and redness (inflammation) of the vagina.   This condition is treated with medicine. Medicines may be over-the-counter or prescription.   Take or apply over-the-counter and prescription medicines only as told by your health care provider.   Do not douche. Do not have sex or use tampons until your health care provider approves.   Contact a health care provider if your symptoms do not get better with treatment or your symptoms go away and then return.  This information is not intended to replace advice given to you by your health care provider. Make sure you discuss any questions you have with your health care  provider.  Document Released: 05/02/2005 Document Revised: 12/09/2017 Document Reviewed: 12/09/2017  Elsevier Interactive Patient Education  2019 [employer]

## 2018-08-25 ENCOUNTER — Other Ambulatory Visit: Payer: Self-pay | Admitting: Family

## 2018-08-25 MED ORDER — POTASSIUM CHLORIDE ER 10 MEQ PO TBCR: 10.0000 meq | EXTENDED_RELEASE_TABLET | Freq: Two times a day (BID) | ORAL | 1 refills | Status: DC

## 2018-11-28 ENCOUNTER — Ambulatory Visit (INDEPENDENT_AMBULATORY_CARE_PROVIDER_SITE_OTHER): Payer: Medicaid Other | Admitting: Family

## 2018-11-28 ENCOUNTER — Other Ambulatory Visit: Payer: Self-pay

## 2018-11-28 ENCOUNTER — Encounter: Payer: Self-pay | Admitting: Family

## 2018-11-28 DIAGNOSIS — F331 Major depressive disorder, recurrent, moderate: Secondary | ICD-10-CM | POA: Diagnosis not present

## 2018-11-28 DIAGNOSIS — E782 Mixed hyperlipidemia: Secondary | ICD-10-CM | POA: Diagnosis not present

## 2018-11-28 DIAGNOSIS — I10 Essential (primary) hypertension: Secondary | ICD-10-CM | POA: Diagnosis not present

## 2018-11-28 DIAGNOSIS — E8881 Metabolic syndrome: Secondary | ICD-10-CM

## 2018-11-28 MED ORDER — HYDROCHLOROTHIAZIDE 25 MG PO TABS: 25.0000 mg | ORAL_TABLET | Freq: Every day | ORAL | 3 refills | Status: DC

## 2018-11-28 MED ORDER — LOSARTAN POTASSIUM 100 MG PO TABS: 100.0000 mg | ORAL_TABLET | Freq: Every day | ORAL | 3 refills | Status: DC

## 2018-11-28 MED ORDER — METOPROLOL SUCCINATE ER 100 MG PO TB24: 100.0000 mg | ORAL_TABLET | Freq: Every day | ORAL | 3 refills | Status: DC

## 2018-11-28 MED ORDER — AMLODIPINE BESYLATE 10 MG PO TABS: 10.0000 mg | ORAL_TABLET | Freq: Every day | ORAL | 3 refills | Status: DC

## 2018-11-28 MED ORDER — CITALOPRAM HYDROBROMIDE 10 MG PO TABS: 10.0000 mg | ORAL_TABLET | Freq: Every day | ORAL | 1 refills | Status: DC

## 2018-11-28 MED ORDER — ATORVASTATIN CALCIUM 40 MG PO TABS: ORAL_TABLET | ORAL | 1 refills | Status: DC

## 2018-11-28 NOTE — Progress Notes (Signed)
Virtual Visit via telephone Note  I connected with Kelli Walls on 11/28/18 at 2:10 pm by telephone and verified that I am speaking with the correct person using two identifiers. Kelli Walls is currently located at home and mother is currently with her during visit. The provider, Evelina Dun, FNP is located in their office at time of visit.  I discussed the limitations, risks, security and privacy concerns of performing an evaluation and management service by telephone and the availability of in person appointments. I also discussed with the patient that there may be a patient responsible charge related to this service. The patient expressed understanding and agreed to proceed.   History and Present Illness:   Pt presents to the office today for chronic follow up and medication refill.  Hypertension  This is a chronic problem. The current episode started more than 1 year ago. The problem has been waxing and waning since onset. Associated symptoms include peripheral edema ("ankles at times"). Pertinent negatives include no malaise/fatigue or shortness of breath. Risk factors for coronary artery disease include dyslipidemia, obesity and sedentary lifestyle. Past treatments include calcium channel blockers, beta blockers and direct vasodilators. The current treatment provides moderate improvement. Hypertensive end-organ damage includes kidney disease. There is no history of CVA or heart failure.  Hyperlipidemia  This is a chronic problem. The current episode started more than 1 year ago. The problem is controlled. Recent lipid tests were reviewed and are normal. Exacerbating diseases include obesity. Pertinent negatives include no shortness of breath. Current antihyperlipidemic treatment includes statins. The current treatment provides moderate improvement of lipids. Risk factors for coronary artery disease include dyslipidemia, hypertension, a sedentary lifestyle and post-menopausal.  Depression          This is a chronic problem.  The current episode started more than 1 year ago.   The onset quality is gradual.   The problem occurs intermittently.  The problem has been waxing and waning since onset.  Associated symptoms include irritable, restlessness, decreased interest and sad.  Associated symptoms include no helplessness, no hopelessness and no suicidal ideas.  Past treatments include SSRIs - Selective serotonin reuptake inhibitors.  Compliance with treatment is good. Metabolic Syndrome PT does not do any scheduled exercise and does not watch what she eats. Take Lipitor.     Review of Systems  Constitutional: Negative for malaise/fatigue.  Respiratory: Negative for shortness of breath.   Psychiatric/Behavioral: Positive for depression. Negative for suicidal ideas.  All other systems reviewed and are negative.      Observations/Objective: No SOB or distress noted  Assessment and Plan: Kelli Walls comes in today with chief complaint of No chief complaint on file.   Diagnosis and orders addressed:  1. Essential hypertension  - amLODipine (NORVASC) 10 MG tablet; Take 1 tablet (10 mg total) by mouth daily.  Dispense: 90 tablet; Refill: 3 - hydrochlorothiazide (HYDRODIURIL) 25 MG tablet; Take 1 tablet (25 mg total) by mouth daily.  Dispense: 90 tablet; Refill: 3 - losartan (COZAAR) 100 MG tablet; Take 1 tablet (100 mg total) by mouth daily.  Dispense: 90 tablet; Refill: 3 - metoprolol succinate (TOPROL-XL) 100 MG 24 hr tablet; Take 1 tablet (100 mg total) by mouth daily. Take with or immediately following a meal.  Dispense: 90 tablet; Refill: 3  2. Mixed hyperlipidemia - atorvastatin (LIPITOR) 40 MG tablet; TAKE 1 TABLET BY MOUTH ONCE DAILY (MUST BE SEEN BEFORE NEXT REFILL)  Dispense: 90 tablet; Refill: 1  3. Moderate episode of recurrent major  depressive disorder (HCC) - citalopram (CELEXA) 10 MG tablet; Take 1 tablet (10 mg total) by mouth daily.  Dispense: 90 tablet;  Refill: 1  4. Obesity, morbid ([place])   5. Metabolic syndrome   Labs reviewed, will repeat in one month Health Maintenance reviewed Diet and exercise encouraged  Follow up plan: 1 month      I discussed the assessment and treatment plan with the patient. The patient was provided an opportunity to ask questions and all were answered. The patient agreed with the plan and demonstrated an understanding of the instructions.   The patient was advised to call back or seek an in-person evaluation if the symptoms worsen or if the condition fails to improve as anticipated.  The above assessment and management plan was discussed with the patient. The patient verbalized understanding of and has agreed to the management plan. Patient is aware to call the clinic if symptoms persist or worsen. Patient is aware when to return to the clinic for a follow-up visit. Patient educated on when it is appropriate to go to the emergency department.    Call ended 2:21 pm, I provided 11 minutes of non-face-to-face time during this encounter.    Evelina Dun, FNP

## 2018-12-11 DIAGNOSIS — L219 Seborrheic dermatitis, unspecified: Secondary | ICD-10-CM | POA: Insufficient documentation

## 2018-12-25 ENCOUNTER — Other Ambulatory Visit: Payer: Self-pay

## 2018-12-26 ENCOUNTER — Encounter: Payer: Self-pay | Admitting: Family

## 2018-12-26 ENCOUNTER — Ambulatory Visit (INDEPENDENT_AMBULATORY_CARE_PROVIDER_SITE_OTHER): Payer: Medicaid Other | Admitting: Family

## 2018-12-26 VITALS — BP 123/85 | HR 91 | Temp 98.5°F | Ht 62.0 in | Wt 250.2 lb

## 2018-12-26 DIAGNOSIS — I10 Essential (primary) hypertension: Secondary | ICD-10-CM | POA: Diagnosis not present

## 2018-12-26 NOTE — Patient Instructions (Signed)

## 2018-12-26 NOTE — Progress Notes (Signed)
   Subjective:    Patient ID: Kelli Walls, female    DOB: Sep 16, 1970, 48 y.o.   MRN: [id number]  Chief Complaint  Patient presents with  . Hypertension   PT presents to the office today to recheck HTN. Pt's BP is at goal!!!! Hypertension  This is a chronic problem. The current episode started more than 1 year ago. The problem has been resolved since onset. The problem is controlled. Pertinent negatives include no malaise/fatigue, peripheral edema or shortness of breath. Risk factors for coronary artery disease include dyslipidemia, obesity and sedentary lifestyle. The current treatment provides moderate improvement. There is no history of CAD/MI, CVA or heart failure.      Review of Systems  Constitutional: Negative for malaise/fatigue.  Respiratory: Negative for shortness of breath.   All other systems reviewed and are negative.      Objective:   Physical Exam Vitals signs reviewed.  Constitutional:      General: She is not in acute distress.    Appearance: She is well-developed.  HENT:     Head: Normocephalic and atraumatic.     Right Ear: Tympanic membrane normal.     Left Ear: Tympanic membrane normal.  Eyes:     Pupils: Pupils are equal, round, and reactive to light.  Neck:     Musculoskeletal: Normal range of motion and neck supple.     Thyroid: No thyromegaly.  Cardiovascular:     Rate and Rhythm: Normal rate and regular rhythm.     Heart sounds: Normal heart sounds. No murmur.  Pulmonary:     Effort: Pulmonary effort is normal. No respiratory distress.     Breath sounds: Normal breath sounds. No wheezing.  Abdominal:     General: Bowel sounds are normal. There is no distension.     Palpations: Abdomen is soft.     Tenderness: There is no abdominal tenderness.  Musculoskeletal: Normal range of motion.        General: No tenderness.  Skin:    General: Skin is warm and dry.  Neurological:     Mental Status: She is alert and oriented to person, place, and time.      Cranial Nerves: No cranial nerve deficit.     Deep Tendon Reflexes: Reflexes are normal and symmetric.  Psychiatric:        Behavior: Behavior normal.        Thought Content: Thought content normal.        Judgment: Judgment normal.       BP 123/85   Pulse 91   Temp 98.5 F (36.9 C) (Oral)   Ht '5\' 2"'$  (1.575 m)   Wt 250 lb 3.2 oz (113.5 kg)   LMP 09/07/2014   BMI 45.76 kg/m      Assessment & Plan:  Kelli Walls comes in today with chief complaint of Hypertension   Diagnosis and orders addressed:  1. Essential hypertension -Dash diet information given -Exercise encouraged - Stress Management  -Continue current meds -RTO in 3 months  - [place], FNP

## 2018-12-27 LAB — CMP14+EGFR
ALT: 51 IU/L — ABNORMAL HIGH (ref 0–32)
AST: 50 IU/L — ABNORMAL HIGH (ref 0–40)
Albumin/Globulin Ratio: 1.4 (ref 1.2–2.2)
Albumin: 4.4 g/dL (ref 3.8–4.8)
Alkaline Phosphatase: 55 IU/L (ref 39–117)
BUN/Creatinine Ratio: 10 (ref 9–23)
BUN: 10 mg/dL (ref 6–24)
Bilirubin Total: 0.3 mg/dL (ref 0.0–1.2)
CO2: 24 mmol/L (ref 20–29)
Calcium: 9.6 mg/dL (ref 8.7–10.2)
Chloride: 104 mmol/L (ref 96–106)
Creatinine, Ser: 1.05 mg/dL — ABNORMAL HIGH (ref 0.57–1.00)
GFR calc Af Amer: 74 mL/min/1.73
GFR calc non Af Amer: 64 mL/min/1.73
Globulin, Total: 3.2 g/dL (ref 1.5–4.5)
Glucose: 76 mg/dL (ref 65–99)
Potassium: 4.2 mmol/L (ref 3.5–5.2)
Sodium: 141 mmol/L (ref 134–144)
Total Protein: 7.6 g/dL (ref 6.0–8.5)

## 2019-03-31 ENCOUNTER — Ambulatory Visit: Payer: Medicaid Other | Admitting: Family

## 2019-03-31 ENCOUNTER — Other Ambulatory Visit: Payer: Self-pay

## 2019-03-31 ENCOUNTER — Encounter: Payer: Self-pay | Admitting: Family

## 2019-03-31 VITALS — BP 127/83 | HR 73 | Temp 98.2°F | Ht 62.0 in | Wt 246.4 lb

## 2019-03-31 DIAGNOSIS — I10 Essential (primary) hypertension: Secondary | ICD-10-CM

## 2019-03-31 DIAGNOSIS — E782 Mixed hyperlipidemia: Secondary | ICD-10-CM

## 2019-03-31 DIAGNOSIS — E8881 Metabolic syndrome: Secondary | ICD-10-CM

## 2019-03-31 DIAGNOSIS — F331 Major depressive disorder, recurrent, moderate: Secondary | ICD-10-CM

## 2019-03-31 DIAGNOSIS — H65192 Other acute nonsuppurative otitis media, left ear: Secondary | ICD-10-CM

## 2019-03-31 MED ORDER — CEFDINIR 300 MG PO CAPS: 300.0000 mg | ORAL_CAPSULE | Freq: Two times a day (BID) | ORAL | 0 refills | Status: DC

## 2019-03-31 NOTE — Patient Instructions (Signed)

## 2019-03-31 NOTE — Progress Notes (Signed)
Subjective:    Patient ID: Kelli Walls, female    DOB: 09-Nov-1970, 48 y.o.   MRN: [id number]  Chief Complaint  Patient presents with  . Hypertension  . left ear discomfort    Hypertension This is a chronic problem. The current episode started more than 1 year ago. The problem has been waxing and waning since onset. The problem is uncontrolled. Associated symptoms include malaise/fatigue and peripheral edema ("a little bit"). Pertinent negatives include no headaches or shortness of breath. Risk factors for coronary artery disease include dyslipidemia, obesity and sedentary lifestyle. The current treatment provides mild improvement. There is no history of kidney disease.  Hyperlipidemia Pertinent negatives include no shortness of breath.  Depression        This is a chronic problem.  The current episode started more than 1 year ago.   The onset quality is gradual.   The problem occurs intermittently.  The problem has been waxing and waning since onset.  Associated symptoms include irritable and decreased interest.  Associated symptoms include no helplessness, no hopelessness and no headaches. Otalgia  There is pain in the left ear. This is a new problem. The current episode started 1 to 4 weeks ago. The problem occurs constantly. The problem has been waxing and waning. The pain is mild. Associated symptoms include rhinorrhea. Pertinent negatives include no headaches or hearing loss. She has tried acetaminophen for the symptoms. The treatment provided mild relief.  Metabolic Syndrome  Pt states she takes her statin every day. She does admit she eat high fat diet. She does not do any scheduled exercising.     Review of Systems  Constitutional: Positive for malaise/fatigue.  HENT: Positive for ear pain and rhinorrhea. Negative for hearing loss.   Respiratory: Negative for shortness of breath.   Neurological: Negative for headaches.  Psychiatric/Behavioral: Positive for depression.  All  other systems reviewed and are negative.      Objective:   Physical Exam Vitals signs reviewed.  Constitutional:      General: She is irritable. She is not in acute distress.    Appearance: She is well-developed.  HENT:     Head: Normocephalic and atraumatic.     Right Ear: Tympanic membrane normal.     Left Ear: Tympanic membrane normal.  Eyes:     Pupils: Pupils are equal, round, and reactive to light.  Neck:     Musculoskeletal: Normal range of motion and neck supple.     Thyroid: No thyromegaly.  Cardiovascular:     Rate and Rhythm: Normal rate and regular rhythm.     Heart sounds: Normal heart sounds. No murmur.  Pulmonary:     Effort: Pulmonary effort is normal. No respiratory distress.     Breath sounds: Normal breath sounds. No wheezing.  Abdominal:     General: Bowel sounds are normal. There is no distension.     Palpations: Abdomen is soft.     Tenderness: There is no abdominal tenderness.  Musculoskeletal: Normal range of motion.        General: No tenderness.  Skin:    General: Skin is warm and dry.  Neurological:     Mental Status: She is alert and oriented to person, place, and time.     Cranial Nerves: No cranial nerve deficit.     Deep Tendon Reflexes: Reflexes are normal and symmetric.  Psychiatric:        Behavior: Behavior normal.        Thought Content: Thought  content normal.        Judgment: Judgment normal.     BP (!) 148/100   Pulse 69   Temp 98.2 F (36.8 C) (Temporal)   Ht _0  (1.575 m)   Wt 246 lb 6.4 oz (111.8 kg)   LMP 09/07/2014   BMI 45.07 kg/m      Assessment & Plan:  Kelli Walls comes in today with chief complaint of Hypertension and left ear discomfort   Diagnosis and orders addressed:  1. Essential hypertension - CMP14+EGFR - CBC with Differential/Platelet  2. Mixed hyperlipidemia - CMP14+EGFR - CBC with Differential/Platelet - Lipid panel  3. Moderate episode of recurrent major depressive disorder (HCC) -  CMP14+EGFR - CBC with Differential/Platelet  4. Obesity, morbid ([place]) - CMP14+EGFR - CBC with Differential/Platelet  5. Metabolic syndrome - VPX10+GYIR - CBC with Differential/Platelet  6. Other acute nonsuppurative otitis media of left ear, recurrence not specified Keep clean and dry - CMP14+EGFR - CBC with Differential/Platelet - cefdinir (OMNICEF) 300 MG capsule; Take 1 capsule (300 mg total) by mouth 2 (two) times daily. 1 po BID  Dispense: 20 capsule; Refill: 0   Labs pending Health Maintenance reviewed Diet and exercise encouraged  Follow up plan: 6 months    Evelina Dun, FNP

## 2019-04-01 LAB — CMP14+EGFR
ALT: 24 IU/L (ref 0–32)
AST: 26 IU/L (ref 0–40)
Albumin/Globulin Ratio: 1.3 (ref 1.2–2.2)
Albumin: 4 g/dL (ref 3.8–4.8)
Alkaline Phosphatase: 62 IU/L (ref 39–117)
BUN/Creatinine Ratio: 12 (ref 9–23)
BUN: 14 mg/dL (ref 6–24)
Bilirubin Total: 0.2 mg/dL (ref 0.0–1.2)
CO2: 26 mmol/L (ref 20–29)
Calcium: 9.1 mg/dL (ref 8.7–10.2)
Chloride: 102 mmol/L (ref 96–106)
Creatinine, Ser: 1.15 mg/dL — ABNORMAL HIGH (ref 0.57–1.00)
GFR calc Af Amer: 66 mL/min/{1.73_m2} (ref >59)
GFR calc non Af Amer: 57 mL/min/{1.73_m2} — ABNORMAL LOW (ref >59)
Globulin, Total: 3.1 g/dL (ref 1.5–4.5)
Glucose: 82 mg/dL (ref 65–99)
Potassium: 3.7 mmol/L (ref 3.5–5.2)
Sodium: 143 mmol/L (ref 134–144)
Total Protein: 7.1 g/dL (ref 6.0–8.5)

## 2019-04-01 LAB — CBC WITH DIFFERENTIAL/PLATELET
Basophils Absolute: 0.1 10*3/uL (ref 0.0–0.2)
Basos: 1 %
EOS (ABSOLUTE): 0.1 10*3/uL (ref 0.0–0.4)
Eos: 1 %
Hematocrit: 36.9 % (ref 34.0–46.6)
Hemoglobin: 12.3 g/dL (ref 11.1–15.9)
Immature Grans (Abs): 0 10*3/uL (ref 0.0–0.1)
Immature Granulocytes: 0 %
Lymphocytes Absolute: 2.7 10*3/uL (ref 0.7–3.1)
Lymphs: 30 %
MCH: 27.8 pg (ref 26.6–33.0)
MCHC: 33.3 g/dL (ref 31.5–35.7)
MCV: 83 fL (ref 79–97)
Monocytes Absolute: 0.8 10*3/uL (ref 0.1–0.9)
Monocytes: 9 %
Neutrophils Absolute: 5.3 10*3/uL (ref 1.4–7.0)
Neutrophils: 59 %
Platelets: 280 10*3/uL (ref 150–450)
RBC: 4.43 x10E6/uL (ref 3.77–5.28)
RDW: 14.2 % (ref 11.7–15.4)
WBC: 8.9 10*3/uL (ref 3.4–10.8)

## 2019-04-01 LAB — LIPID PANEL
Chol/HDL Ratio: 3.8 ratio (ref 0.0–4.4)
Cholesterol, Total: 173 mg/dL (ref 100–199)
HDL: 46 mg/dL (ref >39)
LDL Calculated: 85 mg/dL (ref 0–99)
Triglycerides: 212 mg/dL — ABNORMAL HIGH (ref 0–149)
VLDL Cholesterol Cal: 42 mg/dL — ABNORMAL HIGH (ref 5–40)

## 2019-04-22 ENCOUNTER — Ambulatory Visit (INDEPENDENT_AMBULATORY_CARE_PROVIDER_SITE_OTHER): Payer: Medicaid Other | Admitting: Family Medicine

## 2019-04-22 DIAGNOSIS — H65192 Other acute nonsuppurative otitis media, left ear: Secondary | ICD-10-CM | POA: Diagnosis not present

## 2019-04-22 MED ORDER — CIPRODEX 0.3-0.1 % OT SUSP: 4.0000 [drp] | Freq: Two times a day (BID) | OTIC | 0 refills | Status: AC

## 2019-04-22 NOTE — Progress Notes (Signed)
Telephone visit  Subjective: CC: ears hurting PCP: Sharion Balloon, FNP Kelli Walls is a 48 y.o. female calls for telephone consult today. Patient provides verbal consent for consult held via phone.  Location of patient: home Location of provider: [facility] Others present for call: family (ok to talk)  1. Ears hurting Patient reports that she feels like she has a tingling/painful sensation in her left ear.  She notes that she was recently treated with oral antibiotics for an ear infection but symptoms have not gotten better.  She reports some dizziness when she lies down.  No fevers, nausea, vomiting.   ROS: Per HPI  Allergies  Allergen Reactions  . Penicillins Itching   Past Medical History:  Diagnosis Date  . Asthma   . Depression   . Hyperlipidemia   . Hypertension   . Mental retardation, mild (I.Q. 50-70)   . Migraine   . Suicide attempt [facility])     Current Outpatient Medications:  .  amLODipine (NORVASC) 10 MG tablet, Take 1 tablet (10 mg total) by mouth daily., Disp: 90 tablet, Rfl: 3 .  atorvastatin (LIPITOR) 40 MG tablet, TAKE 1 TABLET BY MOUTH ONCE DAILY (MUST BE SEEN BEFORE NEXT REFILL), Disp: 90 tablet, Rfl: 1 .  cefdinir (OMNICEF) 300 MG capsule, Take 1 capsule (300 mg total) by mouth 2 (two) times daily. 1 po BID, Disp: 20 capsule, Rfl: 0 .  citalopram (CELEXA) 10 MG tablet, Take 1 tablet (10 mg total) by mouth daily., Disp: 90 tablet, Rfl: 1 .  hydrochlorothiazide (HYDRODIURIL) 25 MG tablet, Take 1 tablet (25 mg total) by mouth daily., Disp: 90 tablet, Rfl: 3 .  losartan (COZAAR) 100 MG tablet, Take 1 tablet (100 mg total) by mouth daily., Disp: 90 tablet, Rfl: 3 .  metoprolol succinate (TOPROL-XL) 100 MG 24 hr tablet, Take 1 tablet (100 mg total) by mouth daily. Take with or immediately following a meal., Disp: 90 tablet, Rfl: 3 .  naproxen (NAPROSYN) 500 MG tablet, Take 1 tablet (500 mg total) by mouth 2 (two) times daily with a meal., Disp: 60 tablet, Rfl: 1 .   potassium chloride (K-DUR) 10 MEQ tablet, Take 1 tablet (10 mEq total) by mouth 2 (two) times daily., Disp: 60 tablet, Rfl: 1  Assessment/ Plan: 48 y.o. female   1. Other acute nonsuppurative otitis media of left ear, recurrence not specified Ongoing symptoms despite completion of oral antibiotics.  I have added Ciprodex applied to the left ear twice daily for the next 7 days.  We discussed that if symptoms do not improve substantially, may need to consider referral to ENT for further evaluation.  She voiced good understanding.  She will follow-up PRN - ciprofloxacin-dexamethasone (CIPRODEX) OTIC suspension; Place 4 drops into the left ear 2 (two) times daily for 7 days.  Dispense: 7.5 mL; Refill: 0   Start time: 5:19pm End time: 5:24pm  Total time spent on patient care (including telephone call/ virtual visit): 10 minutes  [place], [place] [phone]

## 2019-05-15 ENCOUNTER — Encounter: Payer: Self-pay | Admitting: Family

## 2019-05-15 ENCOUNTER — Other Ambulatory Visit: Payer: Self-pay

## 2019-05-15 ENCOUNTER — Ambulatory Visit (INDEPENDENT_AMBULATORY_CARE_PROVIDER_SITE_OTHER): Payer: Medicaid Other | Admitting: Family

## 2019-05-15 DIAGNOSIS — H65115 Acute and subacute allergic otitis media (mucoid) (sanguinous) (serous), recurrent, left ear: Secondary | ICD-10-CM

## 2019-05-15 NOTE — Progress Notes (Signed)
   Virtual Visit via telephone Note Due to COVID-19 pandemic this visit was conducted virtually. This visit type was conducted due to national recommendations for restrictions regarding the COVID-19 Pandemic (e.g. social distancing, sheltering in place) in an effort to limit this patient's exposure and mitigate transmission in our community. All issues noted in this document were discussed and addressed.  A physical exam was not performed with this format.  I connected with Kelli Walls on 05/15/19 at 1:55 pm by telephone and verified that I am speaking with the correct person using two identifiers. Kelli Walls is currently located at home and no one is currently with her during visit. The provider, Evelina Dun, FNP is located in their office at time of visit.  I discussed the limitations, risks, security and privacy concerns of performing an evaluation and management service by telephone and the availability of in person appointments. I also discussed with the patient that there may be a patient responsible charge related to this service. The patient expressed understanding and agreed to proceed.     History and Present Illness:  PT calls the office today with recurrent left ear pain. She has completed cefdinir and then ciprodex otic drops with no relief.  Otalgia  There is pain in the right ear. This is a recurrent problem. The current episode started 1 to 4 weeks ago. The problem occurs constantly. The problem has been waxing and waning. There has been no fever. The pain is at a severity of 5/10. The pain is mild. Pertinent negatives include no coughing, ear discharge, headaches, hearing loss or rhinorrhea. She has tried ear drops for the symptoms. The treatment provided mild relief.      Review of Systems  HENT: Positive for ear pain. Negative for ear discharge, hearing loss and rhinorrhea.   Respiratory: Negative for cough.   Neurological: Negative for headaches.  All other systems  reviewed and are negative.    Observations/Objective: No SOB or distress noted   Assessment and Plan: 1. Recurrent acute allergic otitis media of left ear Will place referral to ENT at this time since her symptoms have not improved with antibiotics  Tylenol as needed  Do not stick anything into ear Avoid getting water into ear - Ambulatory referral to ENT    I discussed the assessment and treatment plan with the patient. The patient was provided an opportunity to ask questions and all were answered. The patient agreed with the plan and demonstrated an understanding of the instructions.   The patient was advised to call back or seek an in-person evaluation if the symptoms worsen or if the condition fails to improve as anticipated.  The above assessment and management plan was discussed with the patient. The patient verbalized understanding of and has agreed to the management plan. Patient is aware to call the clinic if symptoms persist or worsen. Patient is aware when to return to the clinic for a follow-up visit. Patient educated on when it is appropriate to go to the emergency department.   Time call ended: 2:07 pm    I provided 12 minutes of non-face-to-face time during this encounter.    Evelina Dun, FNP

## 2019-05-22 ENCOUNTER — Encounter: Payer: Self-pay | Admitting: *Deleted

## 2019-06-29 ENCOUNTER — Ambulatory Visit (INDEPENDENT_AMBULATORY_CARE_PROVIDER_SITE_OTHER): Payer: Medicaid Other | Admitting: Otolaryngology

## 2019-06-29 ENCOUNTER — Telehealth: Payer: Self-pay | Admitting: Family

## 2019-06-29 ENCOUNTER — Other Ambulatory Visit: Payer: Self-pay

## 2019-06-29 DIAGNOSIS — R42 Dizziness and giddiness: Secondary | ICD-10-CM | POA: Diagnosis not present

## 2019-06-29 DIAGNOSIS — H6123 Impacted cerumen, bilateral: Secondary | ICD-10-CM | POA: Diagnosis not present

## 2019-06-29 DIAGNOSIS — H9312 Tinnitus, left ear: Secondary | ICD-10-CM

## 2019-06-30 ENCOUNTER — Encounter: Payer: Self-pay | Admitting: Family

## 2019-06-30 ENCOUNTER — Ambulatory Visit (INDEPENDENT_AMBULATORY_CARE_PROVIDER_SITE_OTHER): Payer: Medicaid Other | Admitting: Family

## 2019-06-30 VITALS — BP 156/95 | HR 66 | Temp 98.0°F | Ht 62.0 in | Wt 256.7 lb

## 2019-06-30 DIAGNOSIS — E782 Mixed hyperlipidemia: Secondary | ICD-10-CM | POA: Diagnosis not present

## 2019-06-30 DIAGNOSIS — Z23 Encounter for immunization: Secondary | ICD-10-CM

## 2019-06-30 DIAGNOSIS — E8881 Metabolic syndrome: Secondary | ICD-10-CM

## 2019-06-30 DIAGNOSIS — F331 Major depressive disorder, recurrent, moderate: Secondary | ICD-10-CM | POA: Diagnosis not present

## 2019-06-30 DIAGNOSIS — I10 Essential (primary) hypertension: Secondary | ICD-10-CM

## 2019-06-30 NOTE — Patient Instructions (Signed)

## 2019-06-30 NOTE — Progress Notes (Signed)
Subjective:    Patient ID: Kelli Walls, female    DOB: 1971-04-22, 48 y.o.   MRN: [id number]  Chief Complaint  Patient presents with  . Medical Management of Chronic Issues   PT presents to the office today for chronic follow up.  Hypertension This is a chronic problem. The current episode started more than 1 year ago. The problem has been waxing and waning since onset. The problem is uncontrolled. Associated symptoms include malaise/fatigue. Pertinent negatives include no peripheral edema or shortness of breath. Risk factors for coronary artery disease include family history, dyslipidemia, obesity and sedentary lifestyle. The current treatment provides mild improvement. There is no history of CVA or heart failure.  Depression        This is a chronic problem.  The current episode started more than 1 year ago.   The onset quality is gradual.   The problem occurs intermittently.  The problem has been waxing and waning since onset.  Associated symptoms include irritable, restlessness, decreased interest and sad.  Associated symptoms include no helplessness and no hopelessness.  Past treatments include SSRIs - Selective serotonin reuptake inhibitors.  Compliance with treatment is good. Hyperlipidemia This is a chronic problem. The current episode started more than 1 year ago. The problem is controlled. Recent lipid tests were reviewed and are normal. Exacerbating diseases include obesity. Pertinent negatives include no shortness of breath. Current antihyperlipidemic treatment includes statins. The current treatment provides moderate improvement of lipids. Risk factors for coronary artery disease include dyslipidemia, hypertension, a sedentary lifestyle, post-menopausal and obesity.  Metabolic Syndrome PT takes Lipitor. She states she tries to walk daily.     Review of Systems  Constitutional: Positive for malaise/fatigue.  Respiratory: Negative for shortness of breath.   Psychiatric/Behavioral:  Positive for depression.  All other systems reviewed and are negative.      Objective:   Physical Exam Vitals signs reviewed.  Constitutional:      General: She is irritable. She is not in acute distress.    Appearance: She is well-developed. She is obese.  HENT:     Head: Normocephalic and atraumatic.     Right Ear: Tympanic membrane normal.     Left Ear: Tympanic membrane normal.  Eyes:     Pupils: Pupils are equal, round, and reactive to light.  Neck:     Musculoskeletal: Normal range of motion and neck supple.     Thyroid: No thyromegaly.  Cardiovascular:     Rate and Rhythm: Normal rate and regular rhythm.     Heart sounds: Normal heart sounds. No murmur.  Pulmonary:     Effort: Pulmonary effort is normal. No respiratory distress.     Breath sounds: Normal breath sounds. No wheezing.  Abdominal:     General: Bowel sounds are normal. There is no distension.     Palpations: Abdomen is soft.     Tenderness: There is no abdominal tenderness.  Musculoskeletal: Normal range of motion.        General: No tenderness.  Skin:    General: Skin is warm and dry.  Neurological:     Mental Status: She is alert and oriented to person, place, and time.     Cranial Nerves: No cranial nerve deficit.     Deep Tendon Reflexes: Reflexes are normal and symmetric.  Psychiatric:        Behavior: Behavior normal.        Thought Content: Thought content normal.        Judgment:  Judgment normal.     BP (!) 163/91   Pulse 66   Temp 98 F (36.7 C) (Temporal)   Ht 5' 2"  (1.575 m)   Wt 256 lb 11.2 oz (116.4 kg)   LMP 09/07/2014   SpO2 99%   BMI 46.95 kg/m      Assessment & Plan:  [name] comes in today with chief complaint of Medical Management of Chronic Issues   Diagnosis and orders addressed:  1. Need for immunization against influenza - Flu Vaccine QUAD 36+ mos IM - BMP8+EGFR  2. Essential hypertension - BMP8+EGFR  3. Obesity, morbid (HCC) - BMP8+EGFR  4. Mixed  hyperlipidemia - BMP8+EGFR  5. Moderate episode of recurrent major depressive disorder (HCC) - OIP1+GFQM  6. Metabolic syndrome - KJI3+XYOF   Labs pending Health Maintenance reviewed Diet and exercise encouraged  Follow up plan: 3 months    Evelina Dun, FNP

## 2019-07-01 LAB — BMP8+EGFR
BUN/Creatinine Ratio: 10 (ref 9–23)
BUN: 11 mg/dL (ref 6–24)
CO2: 28 mmol/L (ref 20–29)
Calcium: 9.1 mg/dL (ref 8.7–10.2)
Chloride: 102 mmol/L (ref 96–106)
Creatinine, Ser: 1.09 mg/dL — ABNORMAL HIGH (ref 0.57–1.00)
GFR calc Af Amer: 70 mL/min/{1.73_m2} (ref >59)
GFR calc non Af Amer: 61 mL/min/{1.73_m2} (ref >59)
Glucose: 72 mg/dL (ref 65–99)
Potassium: 3.9 mmol/L (ref 3.5–5.2)
Sodium: 141 mmol/L (ref 134–144)

## 2019-07-03 ENCOUNTER — Ambulatory Visit: Payer: Medicaid Other | Admitting: Family

## 2019-07-08 ENCOUNTER — Other Ambulatory Visit (HOSPITAL_COMMUNITY): Payer: Self-pay | Admitting: Otolaryngology

## 2019-07-08 ENCOUNTER — Other Ambulatory Visit: Payer: Self-pay | Admitting: Otolaryngology

## 2019-07-08 DIAGNOSIS — H9312 Tinnitus, left ear: Secondary | ICD-10-CM

## 2019-07-15 ENCOUNTER — Encounter (HOSPITAL_COMMUNITY): Payer: Self-pay

## 2019-07-15 ENCOUNTER — Ambulatory Visit (HOSPITAL_COMMUNITY): Admission: RE | Admit: 2019-07-15 | Payer: Medicaid Other | Source: Ambulatory Visit

## 2019-07-29 ENCOUNTER — Other Ambulatory Visit: Payer: Self-pay | Admitting: Family

## 2019-07-29 ENCOUNTER — Ambulatory Visit (HOSPITAL_COMMUNITY): Payer: Medicaid Other | Attending: Otolaryngology

## 2019-07-29 DIAGNOSIS — Z1231 Encounter for screening mammogram for malignant neoplasm of breast: Secondary | ICD-10-CM

## 2019-08-12 ENCOUNTER — Ambulatory Visit (HOSPITAL_COMMUNITY)
Admission: RE | Admit: 2019-08-12 | Discharge: 2019-08-12 | Disposition: A | Discharge status: Home / Self Care | Payer: Medicaid Other | Source: Ambulatory Visit | Attending: Otolaryngology | Admitting: Otolaryngology

## 2019-08-12 ENCOUNTER — Other Ambulatory Visit (HOSPITAL_COMMUNITY): Payer: Self-pay | Admitting: Otolaryngology

## 2019-08-12 ENCOUNTER — Other Ambulatory Visit: Payer: Self-pay

## 2019-08-12 DIAGNOSIS — H9312 Tinnitus, left ear: Secondary | ICD-10-CM

## 2019-08-12 LAB — POCT I-STAT CREATININE: Creatinine, Ser: 1 mg/dL (ref 0.44–1.00)

## 2019-09-17 ENCOUNTER — Other Ambulatory Visit: Payer: Self-pay

## 2019-09-17 ENCOUNTER — Ambulatory Visit
Admission: RE | Admit: 2019-09-17 | Discharge: 2019-09-17 | Disposition: A | Discharge status: Home / Self Care | Payer: Medicaid Other | Source: Ambulatory Visit | Attending: Family | Admitting: Family

## 2019-09-17 DIAGNOSIS — Z1231 Encounter for screening mammogram for malignant neoplasm of breast: Secondary | ICD-10-CM

## 2019-09-30 ENCOUNTER — Other Ambulatory Visit: Payer: Self-pay

## 2019-10-01 ENCOUNTER — Ambulatory Visit (INDEPENDENT_AMBULATORY_CARE_PROVIDER_SITE_OTHER): Payer: Medicaid Other | Admitting: Family

## 2019-10-01 ENCOUNTER — Encounter: Payer: Self-pay | Admitting: Family

## 2019-10-01 DIAGNOSIS — F331 Major depressive disorder, recurrent, moderate: Secondary | ICD-10-CM | POA: Diagnosis not present

## 2019-10-01 DIAGNOSIS — I10 Essential (primary) hypertension: Secondary | ICD-10-CM | POA: Diagnosis not present

## 2019-10-01 DIAGNOSIS — E782 Mixed hyperlipidemia: Secondary | ICD-10-CM | POA: Diagnosis not present

## 2019-10-01 MED ORDER — CITALOPRAM HYDROBROMIDE 10 MG PO TABS: 10.0000 mg | ORAL_TABLET | Freq: Every day | ORAL | 1 refills | Status: DC

## 2019-10-01 MED ORDER — METOPROLOL SUCCINATE ER 100 MG PO TB24: 100.0000 mg | ORAL_TABLET | Freq: Every day | ORAL | 3 refills | Status: DC

## 2019-10-01 MED ORDER — LOSARTAN POTASSIUM 100 MG PO TABS: 100.0000 mg | ORAL_TABLET | Freq: Every day | ORAL | 3 refills | Status: DC

## 2019-10-01 MED ORDER — ATORVASTATIN CALCIUM 40 MG PO TABS: ORAL_TABLET | ORAL | 1 refills | Status: DC

## 2019-10-01 MED ORDER — HYDROCHLOROTHIAZIDE 25 MG PO TABS: 25.0000 mg | ORAL_TABLET | Freq: Every day | ORAL | 3 refills | Status: DC

## 2019-10-01 MED ORDER — AMLODIPINE BESYLATE 10 MG PO TABS: 10.0000 mg | ORAL_TABLET | Freq: Every day | ORAL | 3 refills | Status: DC

## 2019-10-01 NOTE — Patient Instructions (Signed)
Health Maintenance, Female Adopting a healthy lifestyle and getting preventive care are important in promoting health and wellness. Ask your health care provider about:  The right schedule for you to have regular tests and exams.  Things you can do on your own to prevent diseases and keep yourself healthy. What should I know about diet, weight, and exercise? Eat a healthy diet   Eat a diet that includes plenty of vegetables, fruits, low-fat dairy products, and lean protein.  Do not eat a lot of foods that are high in solid fats, added sugars, or sodium. Maintain a healthy weight Body mass index (BMI) is used to identify weight problems. It estimates body fat based on height and weight. Your health care provider can help determine your BMI and help you achieve or maintain a healthy weight. Get regular exercise Get regular exercise. This is one of the most important things you can do for your health. Most adults should:  Exercise for at least 150 minutes each week. The exercise should increase your heart rate and make you sweat (moderate-intensity exercise).  Do strengthening exercises at least twice a week. This is in addition to the moderate-intensity exercise.  Spend less time sitting. Even light physical activity can be beneficial. Watch cholesterol and blood lipids Have your blood tested for lipids and cholesterol at 47 years of age, then have this test every 5 years. Have your cholesterol levels checked more often if:  Your lipid or cholesterol levels are high.  You are older than 47 years of age.  You are at high risk for heart disease. What should I know about cancer screening? Depending on your health history and family history, you may need to have cancer screening at various ages. This may include screening for:  Breast cancer.  Cervical cancer.  Colorectal cancer.  Skin cancer.  Lung cancer. What should I know about heart disease, diabetes, and high blood  pressure? Blood pressure and heart disease  High blood pressure causes heart disease and increases the risk of stroke. This is more likely to develop in people who have high blood pressure readings, are of African descent, or are overweight.  Have your blood pressure checked: ? Every 3-5 years if you are 18-39 years of age. ? Every year if you are 40 years old or older. Diabetes Have regular diabetes screenings. This checks your fasting blood sugar level. Have the screening done:  Once every three years after age 40 if you are at a normal weight and have a low risk for diabetes.  More often and at a younger age if you are overweight or have a high risk for diabetes. What should I know about preventing infection? Hepatitis B If you have a higher risk for hepatitis B, you should be screened for this virus. Talk with your health care provider to find out if you are at risk for hepatitis B infection. Hepatitis C Testing is recommended for:  Everyone born from 1945 through 1965.  Anyone with known risk factors for hepatitis C. Sexually transmitted infections (STIs)  Get screened for STIs, including gonorrhea and chlamydia, if: ? You are sexually active and are younger than 47 years of age. ? You are older than 47 years of age and your health care provider tells you that you are at risk for this type of infection. ? Your sexual activity has changed since you were last screened, and you are at increased risk for chlamydia or gonorrhea. Ask your health care provider if   you are at risk.  Ask your health care provider about whether you are at high risk for HIV. Your health care provider may recommend a prescription medicine to help prevent HIV infection. If you choose to take medicine to prevent HIV, you should first get tested for HIV. You should then be tested every 3 months for as long as you are taking the medicine. Pregnancy  If you are about to stop having your period (premenopausal) and  you may become pregnant, seek counseling before you get pregnant.  Take 400 to 800 micrograms (mcg) of folic acid every day if you become pregnant.  Ask for birth control (contraception) if you want to prevent pregnancy. Osteoporosis and menopause Osteoporosis is a disease in which the bones lose minerals and strength with aging. This can result in bone fractures. If you are 65 years old or older, or if you are at risk for osteoporosis and fractures, ask your health care provider if you should:  Be screened for bone loss.  Take a calcium or vitamin D supplement to lower your risk of fractures.  Be given hormone replacement therapy (HRT) to treat symptoms of menopause. Follow these instructions at home: Lifestyle  Do not use any products that contain nicotine or tobacco, such as cigarettes, e-cigarettes, and chewing tobacco. If you need help quitting, ask your health care provider.  Do not use street drugs.  Do not share needles.  Ask your health care provider for help if you need support or information about quitting drugs. Alcohol use  Do not drink alcohol if: ? Your health care provider tells you not to drink. ? You are pregnant, may be pregnant, or are planning to become pregnant.  If you drink alcohol: ? Limit how much you use to 0-1 drink a day. ? Limit intake if you are breastfeeding.  Be aware of how much alcohol is in your drink. In the [place] one drink equals one 12 oz bottle of beer (355 mL), one 5 oz glass of wine (148 mL), or one 1 oz glass of hard liquor (44 mL). General instructions  Schedule regular health, dental, and eye exams.  Stay current with your vaccines.  Tell your health care provider if: ? You often feel depressed. ? You have ever been abused or do not feel safe at home. Summary  Adopting a healthy lifestyle and getting preventive care are important in promoting health and wellness.  Follow your health care provider's instructions about healthy  diet, exercising, and getting tested or screened for diseases.  Follow your health care provider's instructions on monitoring your cholesterol and blood pressure. This information is not intended to replace advice given to you by your health care provider. Make sure you discuss any questions you have with your health care provider. Document Revised: 07/16/2018 Document Reviewed: 07/16/2018 Elsevier Patient Education  2020 [employer]  

## 2019-10-01 NOTE — Progress Notes (Signed)
Subjective:    Patient ID: Kelli Walls, female    DOB: 1971-07-21, 49 y.o.   MRN: [id number]  Chief Complaint  Patient presents with  . Medical Management of Chronic Issues  . Hypertension  . Cough   Pt presents to the office today for chronic follow up.  Hypertension This is a chronic problem. The current episode started more than 1 year ago. The problem has been waxing and waning since onset. The problem is uncontrolled. Associated symptoms include anxiety and peripheral edema ("ankles at times"). Pertinent negatives include no headaches, malaise/fatigue or shortness of breath. Risk factors for coronary artery disease include dyslipidemia and obesity. The current treatment provides moderate improvement. There is no history of kidney disease or heart failure.  Cough This is a new problem. The current episode started more than 1 month ago. The problem has been waxing and waning. Episode frequency: at night. The cough is non-productive. Pertinent negatives include no chills, ear congestion, ear pain, headaches, hemoptysis, myalgias, nasal congestion or shortness of breath. She has tried rest for the symptoms. The treatment provided mild relief.  Hyperlipidemia This is a chronic problem. The current episode started more than 1 year ago. The problem is uncontrolled. Recent lipid tests were reviewed and are high. Pertinent negatives include no myalgias or shortness of breath. Current antihyperlipidemic treatment includes statins. The current treatment provides moderate improvement of lipids. Risk factors for coronary artery disease include dyslipidemia, hypertension and a sedentary lifestyle.  Anxiety Presents for follow-up visit. Symptoms include depressed mood, excessive worry, irritability, nervous/anxious behavior and restlessness. Patient reports no shortness of breath. Symptoms occur most days. The severity of symptoms is moderate.    Depression        This is a chronic problem.  The  current episode started more than 1 year ago.   The onset quality is gradual.   The problem occurs intermittently.  Associated symptoms include irritable, restlessness, decreased interest and sad.  Associated symptoms include no helplessness, no hopelessness, no myalgias and no headaches.  Past treatments include SSRIs - Selective serotonin reuptake inhibitors.  Past medical history includes anxiety.       Review of Systems  Constitutional: Positive for irritability. Negative for chills and malaise/fatigue.  HENT: Negative for ear pain.   Respiratory: Positive for cough. Negative for hemoptysis and shortness of breath.   Musculoskeletal: Negative for myalgias.  Neurological: Negative for headaches.  Psychiatric/Behavioral: Positive for depression. The patient is nervous/anxious.   All other systems reviewed and are negative.      Objective:   Physical Exam Vitals reviewed.  Constitutional:      General: She is irritable. She is not in acute distress.    Appearance: She is well-developed. She is obese.  HENT:     Head: Normocephalic and atraumatic.     Right Ear: Tympanic membrane normal.     Left Ear: Tympanic membrane normal.  Eyes:     Pupils: Pupils are equal, round, and reactive to light.  Neck:     Thyroid: No thyromegaly.  Cardiovascular:     Rate and Rhythm: Normal rate and regular rhythm.     Heart sounds: Normal heart sounds. No murmur.  Pulmonary:     Effort: Pulmonary effort is normal. No respiratory distress.     Breath sounds: Normal breath sounds. No wheezing.  Abdominal:     General: Bowel sounds are normal. There is no distension.     Palpations: Abdomen is soft.     Tenderness:  There is no abdominal tenderness.  Musculoskeletal:        General: No tenderness. Normal range of motion.     Cervical back: Normal range of motion and neck supple.  Skin:    General: Skin is warm and dry.  Neurological:     Mental Status: She is alert and oriented to person,  place, and time.     Cranial Nerves: No cranial nerve deficit.     Deep Tendon Reflexes: Reflexes are normal and symmetric.  Psychiatric:        Behavior: Behavior normal.        Thought Content: Thought content normal.        Judgment: Judgment normal.       BP (!) 152/82   Pulse (!) 118   Temp (!) 97.3 F (36.3 C) (Temporal)   Ht _0  (1.575 m)   Wt 247 lb (112 kg)   LMP 09/07/2014   SpO2 98%   BMI 45.18 kg/m       Assessment & Plan:  Kelli Walls comes in today with chief complaint of Medical Management of Chronic Issues, Hypertension, and Cough   Diagnosis and orders addressed:  1. Essential hypertension - amLODipine (NORVASC) 10 MG tablet; Take 1 tablet (10 mg total) by mouth daily.  Dispense: 90 tablet; Refill: 3 - hydrochlorothiazide (HYDRODIURIL) 25 MG tablet; Take 1 tablet (25 mg total) by mouth daily.  Dispense: 90 tablet; Refill: 3 - metoprolol succinate (TOPROL-XL) 100 MG 24 hr tablet; Take 1 tablet (100 mg total) by mouth daily. Take with or immediately following a meal.  Dispense: 90 tablet; Refill: 3 - losartan (COZAAR) 100 MG tablet; Take 1 tablet (100 mg total) by mouth daily.  Dispense: 90 tablet; Refill: 3 - CMP14+EGFR  2. Mixed hyperlipidemia - atorvastatin (LIPITOR) 40 MG tablet; TAKE 1 TABLET BY MOUTH ONCE DAILY (MUST BE SEEN BEFORE NEXT REFILL)  Dispense: 90 tablet; Refill: 1 - CMP14+EGFR  3. Moderate episode of recurrent major depressive disorder (HCC) - citalopram (CELEXA) 10 MG tablet; Take 1 tablet (10 mg total) by mouth daily.  Dispense: 90 tablet; Refill: 1 - CMP14+EGFR  4. Obesity, morbid ([place]) - CMP14+EGFR   Labs pending Health Maintenance reviewed Diet and exercise encouraged  Follow up plan: 6 months    Evelina Dun, FNP

## 2019-10-02 LAB — CMP14+EGFR
ALT: 25 IU/L (ref 0–32)
AST: 25 IU/L (ref 0–40)
Albumin/Globulin Ratio: 1.2 (ref 1.2–2.2)
Albumin: 4.1 g/dL (ref 3.8–4.8)
Alkaline Phosphatase: 61 IU/L (ref 39–117)
BUN/Creatinine Ratio: 14 (ref 9–23)
BUN: 16 mg/dL (ref 6–24)
Bilirubin Total: 0.2 mg/dL (ref 0.0–1.2)
CO2: 26 mmol/L (ref 20–29)
Calcium: 9.7 mg/dL (ref 8.7–10.2)
Chloride: 102 mmol/L (ref 96–106)
Creatinine, Ser: 1.11 mg/dL — ABNORMAL HIGH (ref 0.57–1.00)
GFR calc Af Amer: 69 mL/min/{1.73_m2} (ref >59)
GFR calc non Af Amer: 60 mL/min/{1.73_m2} (ref >59)
Globulin, Total: 3.5 g/dL (ref 1.5–4.5)
Glucose: 125 mg/dL — ABNORMAL HIGH (ref 65–99)
Potassium: 3.5 mmol/L (ref 3.5–5.2)
Sodium: 143 mmol/L (ref 134–144)
Total Protein: 7.6 g/dL (ref 6.0–8.5)

## 2019-11-05 ENCOUNTER — Ambulatory Visit: Payer: Self-pay

## 2019-12-28 LAB — HM PAP SMEAR: HM Pap smear: NEGATIVE

## 2020-01-03 ENCOUNTER — Other Ambulatory Visit: Payer: Self-pay

## 2020-01-03 ENCOUNTER — Encounter (HOSPITAL_COMMUNITY): Payer: Self-pay | Admitting: Emergency Medicine

## 2020-01-03 ENCOUNTER — Emergency Department (HOSPITAL_COMMUNITY)
Admission: EM | Admit: 2020-01-03 | Discharge: 2020-01-03 | Disposition: A | Discharge status: Home / Self Care | Payer: Medicaid Other | Attending: Emergency Medicine | Admitting: Emergency Medicine

## 2020-01-03 DIAGNOSIS — F419 Anxiety disorder, unspecified: Secondary | ICD-10-CM | POA: Insufficient documentation

## 2020-01-03 DIAGNOSIS — E876 Hypokalemia: Secondary | ICD-10-CM | POA: Insufficient documentation

## 2020-01-03 DIAGNOSIS — R002 Palpitations: Secondary | ICD-10-CM | POA: Insufficient documentation

## 2020-01-03 DIAGNOSIS — F329 Major depressive disorder, single episode, unspecified: Secondary | ICD-10-CM | POA: Diagnosis not present

## 2020-01-03 DIAGNOSIS — F410 Panic disorder [episodic paroxysmal anxiety] without agoraphobia: Secondary | ICD-10-CM | POA: Diagnosis present

## 2020-01-03 DIAGNOSIS — Z79899 Other long term (current) drug therapy: Secondary | ICD-10-CM | POA: Insufficient documentation

## 2020-01-03 DIAGNOSIS — I10 Essential (primary) hypertension: Secondary | ICD-10-CM | POA: Insufficient documentation

## 2020-01-03 LAB — BASIC METABOLIC PANEL
Anion gap: 7 (ref 5–15)
BUN: 19 mg/dL (ref 6–20)
CO2: 29 mmol/L (ref 22–32)
Calcium: 9.5 mg/dL (ref 8.9–10.3)
Chloride: 104 mmol/L (ref 98–111)
Creatinine, Ser: 1.08 mg/dL — ABNORMAL HIGH (ref 0.44–1.00)
GFR calc Af Amer: >60 mL/min (ref >60)
GFR calc non Af Amer: >60 mL/min (ref >60)
Glucose, Bld: 88 mg/dL (ref 70–99)
Potassium: 3.1 mmol/L — ABNORMAL LOW (ref 3.5–5.1)
Sodium: 140 mmol/L (ref 135–145)

## 2020-01-03 LAB — CBC
HCT: 39.6 % (ref 36.0–46.0)
Hemoglobin: 12.6 g/dL (ref 12.0–15.0)
MCH: 27.8 pg (ref 26.0–34.0)
MCHC: 31.8 g/dL (ref 30.0–36.0)
MCV: 87.4 fL (ref 80.0–100.0)
Platelets: 239 10*3/uL (ref 150–400)
RBC: 4.53 MIL/uL (ref 3.87–5.11)
RDW: 14.6 % (ref 11.5–15.5)
WBC: 8.9 10*3/uL (ref 4.0–10.5)
nRBC: 0 % (ref 0.0–0.2)

## 2020-01-03 MED ORDER — SERTRALINE HCL 25 MG PO TABS: 25.0000 mg | ORAL_TABLET | Freq: Every day | ORAL | 0 refills | Status: DC

## 2020-01-03 MED ORDER — POTASSIUM CHLORIDE CRYS ER 20 MEQ PO TBCR: 20.0000 meq | EXTENDED_RELEASE_TABLET | Freq: Every day | ORAL | 0 refills | Status: DC

## 2020-01-03 NOTE — ED Notes (Signed)
Pt reports she last had her BP meds yesterday

## 2020-01-03 NOTE — ED Provider Notes (Signed)
[facility] EMERGENCY DEPARTMENT Provider Note   CSN: [id number] Arrival date & time: 01/03/20  1316     History Chief Complaint  Patient presents with  . Panic Attack    Kelli Walls is a 49 y.o. female.  HPI   This patient is a 49 year old female, she has a history of anxiety and depression, she no longer takes medications and states it has been quite sometime since she has been on any medicines.  She also has hyperlipidemia, hypertension and mild mental retardation.  She lives by herself, she has a dog, she is very close with her father and family members and recently found out that her father was sick with COPD, her and had fallen and was going to require extra assistance, her dog was diagnosed with cancer and is going to be put down, after finding these things out she started to develop some palpitations felt like her heart was racing, no chest pain no shortness of breath no fever no cough no nausea or vomiting or diarrhea, she no longer takes antianxiety medicines and has never had any problems with palpitations or arrhythmia.  Currently the symptoms are mild at present.  She denies any suicidal thoughts at all and denies any substance abuse or use of tobacco or alcohol.  Past Medical History:  Diagnosis Date  . Asthma   . Depression   . Hyperlipidemia   . Hypertension   . Mental retardation, mild (I.Q. 50-70)   . Migraine   . Suicide attempt [facility])     Patient Active Problem List   Diagnosis Date Noted  . Seborrheic dermatitis 12/11/2018  . Hyperlipemia 04/16/2016  . Essential hypertension 12/12/2015  . Metabolic syndrome [id number]  . Major depression, recurrent ([place]) 05/24/2013  . Prolonged Q-T interval on ECG 05/22/2013  . Suicide attempt by other psychotropic drug overdose ([place]) 05/21/2013  . Acute renal insufficiency 05/21/2013  . Hypokalemia 05/21/2013  . Migraine headache 04/20/2013  . Obesity, morbid ([place]) 02/02/2013    Past Surgical History:  Procedure  Laterality Date  . ABDOMINAL HYSTERECTOMY    . ENDOMETRIAL ABLATION    . TONSILLECTOMY       OB History   No obstetric history on file.     Family History  Problem Relation Age of Onset  . Diabetes Mother   . Hyperlipidemia Mother   . Breast cancer Maternal Aunt     Social History   Tobacco Use  . Smoking status: Never Smoker  . Smokeless tobacco: Never Used  Substance Use Topics  . Alcohol use: No    Alcohol/week: 0.0 standard drinks    Comment: pt reports a history of drinking etoh but denies current use  . Drug use: No    Home Medications Prior to Admission medications   Medication Sig Start Date End Date Taking? Authorizing Provider  amLODipine (NORVASC) 10 MG tablet Take 1 tablet (10 mg total) by mouth daily. 10/01/19  Yes Hawks, Christy A, FNP  atorvastatin (LIPITOR) 40 MG tablet TAKE 1 TABLET BY MOUTH ONCE DAILY (MUST BE SEEN BEFORE NEXT REFILL) 10/01/19  Yes Hawks, Christy A, FNP  citalopram (CELEXA) 10 MG tablet Take 1 tablet (10 mg total) by mouth daily. 10/01/19  Yes Hawks, Christy A, FNP  hydrochlorothiazide (HYDRODIURIL) 25 MG tablet Take 1 tablet (25 mg total) by mouth daily. 10/01/19  Yes Hawks, Christy A, FNP  losartan (COZAAR) 100 MG tablet Take 1 tablet (100 mg total) by mouth daily. 10/01/19  Yes Sharion Balloon, FNP  metoprolol succinate (TOPROL-XL) 100 MG 24 hr tablet Take 1 tablet (100 mg total) by mouth daily. Take with or immediately following a meal. 10/01/19  Yes Hawks, Christy A, FNP  potassium chloride SA (KLOR-CON) 20 MEQ tablet Take 1 tablet (20 mEq total) by mouth daily for 5 days. 01/03/20 01/08/20  Noemi Chapel, MD  sertraline (ZOLOFT) 25 MG tablet Take 1 tablet (25 mg total) by mouth daily. 01/03/20 02/02/20  Noemi Chapel, MD    Allergies    Penicillins  Review of Systems   Review of Systems  All other systems reviewed and are negative.   Physical Exam Updated Vital Signs BP (!) 134/103 (BP Location: Left Arm)   Pulse 87   Temp 98.7 F  (37.1 C) (Oral)   Resp 19   Ht 1.6 m (5\' 3" )   Wt 113.1 kg   LMP 09/07/2014   SpO2 96%   BMI 44.16 kg/m   Physical Exam Vitals and nursing note reviewed.  Constitutional:      General: She is not in acute distress.    Appearance: She is well-developed.  HENT:     Head: Normocephalic and atraumatic.     Mouth/Throat:     Pharynx: No oropharyngeal exudate.  Eyes:     General: No scleral icterus.       Right eye: No discharge.        Left eye: No discharge.     Conjunctiva/sclera: Conjunctivae normal.     Pupils: Pupils are equal, round, and reactive to light.  Neck:     Thyroid: No thyromegaly.     Vascular: No JVD.  Cardiovascular:     Rate and Rhythm: Normal rate and regular rhythm.     Heart sounds: Normal heart sounds. No murmur. No friction rub. No gallop.   Pulmonary:     Effort: Pulmonary effort is normal. No respiratory distress.     Breath sounds: Normal breath sounds. No wheezing or rales.  Abdominal:     General: Bowel sounds are normal. There is no distension.     Palpations: Abdomen is soft. There is no mass.     Tenderness: There is no abdominal tenderness.  Musculoskeletal:        General: No tenderness. Normal range of motion.     Cervical back: Normal range of motion and neck supple.  Lymphadenopathy:     Cervical: No cervical adenopathy.  Skin:    General: Skin is warm and dry.     Findings: No erythema or rash.  Neurological:     Mental Status: She is alert.     Coordination: Coordination normal.  Psychiatric:        Behavior: Behavior normal.     ED Results / Procedures / Treatments   Labs (all labs ordered are listed, but only abnormal results are displayed) Labs Reviewed  BASIC METABOLIC PANEL - Abnormal; Notable for the following components:      Result Value   Potassium 3.1 (*)    Creatinine, Ser 1.08 (*)    All other components within normal limits  CBC    EKG EKG Interpretation  Date/Time:  Sunday Jan 03 2020 14:08:57  EDT Ventricular Rate:  92 PR Interval:    QRS Duration: 81 QT Interval:  394 QTC Calculation: 488 R Axis:   37 Text Interpretation: Sinus rhythm Probable anterior infarct, old since last tracing no significant change Confirmed by Noemi Chapel [phone]) on 01/03/2020 2:54:15 PM   Radiology No results found.  Procedures Procedures (including critical care time)  Medications Ordered in ED Medications - No data to display  ED Course  I have reviewed the triage vital signs and the nursing notes.  Pertinent labs & imaging results that were available during my care of the patient were reviewed by me and considered in my medical decision making (see chart for details).    MDM Rules/Calculators/A&P                      The patient is pleasant, her affect is consistent with her situation of being mildly anxious.  She has a heart rate that ranges between 85 and 95 with normal pulses, no edema, normal heart sounds, normal lung sounds and is not at risk of danger to herself based on her story and her presentation.  We will check electrolytes, CBC, EKG but plan on starting sertraline and have her follow-up with her PCP for ongoing treatment of her anxiety as she has no changes in life conditions which may predispose her to worsening anxiety.  The patient is agreeable to the plan.  EKG shows rate less then 100, QT prolongation is not severe at 488, it is less than 500.  The patient will have her potassium replaced as an outpatient, will start on sertraline, the patient is agreeable and very stable for discharge, no tachycardia or arrhythmia present.  Thus I suspect that her symptoms are more related to the underlying anxiety  Final Clinical Impression(s) / ED Diagnoses Final diagnoses:  Hypokalemia  Palpitations  Anxiety and depression    Rx / DC Orders ED Discharge Orders         Ordered    potassium chloride SA (KLOR-CON) 20 MEQ tablet  Daily     01/03/20 1453    sertraline (ZOLOFT) 25  MG tablet  Daily     01/03/20 1453           Noemi Chapel, MD 01/03/20 1454

## 2020-01-03 NOTE — ED Triage Notes (Signed)
Pt reports feeling like she's having a panic attack with increased heart rate and shaky hands started today, she reports increased stress

## 2020-01-03 NOTE — Discharge Instructions (Signed)
Your testing today shows that you have a slightly low potassium.  Please take the potassium supplement once a day for the next 5 days  Your EKG looks normal, there is no signs of heart attack or irregular heart rate, this does not appear to be a problem with your heart  Because of the increasing anxiety and depression play start taking sertraline, 1 tablet a day.  You must follow-up with your family doctor within the next week or 2, they will need to follow you closely while you are taking an antianxiety medication.  Please seek a medical exam in the emergency department for any severe or worsening symptoms.

## 2020-03-11 ENCOUNTER — Other Ambulatory Visit: Payer: Self-pay | Admitting: Family

## 2020-03-11 DIAGNOSIS — F331 Major depressive disorder, recurrent, moderate: Secondary | ICD-10-CM

## 2020-03-11 DIAGNOSIS — E782 Mixed hyperlipidemia: Secondary | ICD-10-CM

## 2020-03-30 ENCOUNTER — Other Ambulatory Visit: Payer: Self-pay

## 2020-03-30 ENCOUNTER — Ambulatory Visit: Payer: Medicaid Other | Admitting: Family

## 2020-03-30 NOTE — Progress Notes (Signed)
Called multiple times, no answer. VM left, no call back.   Evelina Dun, FNP

## 2020-04-14 ENCOUNTER — Ambulatory Visit (INDEPENDENT_AMBULATORY_CARE_PROVIDER_SITE_OTHER): Payer: Medicaid Other | Admitting: Family

## 2020-04-14 ENCOUNTER — Encounter: Payer: Self-pay | Admitting: Family

## 2020-04-14 DIAGNOSIS — G43009 Migraine without aura, not intractable, without status migrainosus: Secondary | ICD-10-CM

## 2020-04-14 DIAGNOSIS — I10 Essential (primary) hypertension: Secondary | ICD-10-CM | POA: Diagnosis not present

## 2020-04-14 DIAGNOSIS — E782 Mixed hyperlipidemia: Secondary | ICD-10-CM | POA: Diagnosis not present

## 2020-04-14 DIAGNOSIS — F331 Major depressive disorder, recurrent, moderate: Secondary | ICD-10-CM | POA: Diagnosis not present

## 2020-04-14 MED ORDER — CITALOPRAM HYDROBROMIDE 20 MG PO TABS: 20.0000 mg | ORAL_TABLET | Freq: Every day | ORAL | 1 refills | Status: DC

## 2020-04-14 NOTE — Progress Notes (Signed)
Virtual Visit via telephone Note Due to COVID-19 pandemic this visit was conducted virtually. This visit type was conducted due to national recommendations for restrictions regarding the COVID-19 Pandemic (e.g. social distancing, sheltering in place) in an effort to limit this patient's exposure and mitigate transmission in our community. All issues noted in this document were discussed and addressed.  A physical exam was not performed with this format.  I connected with [name] on 04/14/20 at 12:12 pm  by telephone and verified that I am speaking with the correct person using two identifiers. Kelli Walls is currently located at home and no one is currently with her during visit. The provider, Evelina Dun, FNP is located in their office at time of visit.  I discussed the limitations, risks, security and privacy concerns of performing an evaluation and management service by telephone and the availability of in person appointments. I also discussed with the patient that there may be a patient responsible charge related to this service. The patient expressed understanding and agreed to proceed.   History and Present Illness:  Hypertension This is a chronic problem. The current episode started more than 1 year ago. The problem has been resolved since onset. The problem is controlled. Associated symptoms include headaches and malaise/fatigue. Pertinent negatives include no peripheral edema or shortness of breath. Risk factors for coronary artery disease include dyslipidemia, obesity and sedentary lifestyle. The current treatment provides moderate improvement. There is no history of kidney disease or heart failure.  Headache  This is a chronic problem. The current episode started more than 1 year ago. The problem occurs intermittently. Her past medical history is significant for hypertension and obesity.  Depression        This is a chronic problem.  The current episode started more than 1 year  ago.   The onset quality is gradual.   The problem occurs intermittently.  The problem has been waxing and waning since onset.  Associated symptoms include fatigue, helplessness, irritable, restlessness, headaches and sad.  Associated symptoms include no hopelessness and no suicidal ideas.     The symptoms are aggravated by medication withdrawal.  Past treatments include SSRIs - Selective serotonin reuptake inhibitors. Hyperlipidemia This is a chronic problem. The current episode started more than 1 year ago. The problem is controlled. Recent lipid tests were reviewed and are normal. Exacerbating diseases include obesity. Pertinent negatives include no shortness of breath. Current antihyperlipidemic treatment includes statins. The current treatment provides moderate improvement of lipids. Risk factors for coronary artery disease include dyslipidemia, female sex, hypertension and a sedentary lifestyle.      Review of Systems  Constitutional: Positive for fatigue and malaise/fatigue.  Respiratory: Negative for shortness of breath.   Neurological: Positive for headaches.  Psychiatric/Behavioral: Positive for depression. Negative for suicidal ideas.  All other systems reviewed and are negative.    Observations/Objective: No SOB or distress noted  Assessment and Plan: Kelli Walls comes in today with chief complaint of No chief complaint on file.   Diagnosis and orders addressed:  1. Essential hypertension - CMP14+EGFR; Future - CBC with Differential/Platelet; Future  2. Migraine without aura and without status migrainosus, not intractable - CMP14+EGFR; Future - CBC with Differential/Platelet; Future  3. Mixed hyperlipidemia - CMP14+EGFR; Future - CBC with Differential/Platelet; Future - Lipid panel; Future  4. Moderate episode of recurrent major depressive disorder (HCC) - Will increase Celexa to 20 mg from 10 mg and stop Zoloft 25 mg Stress management discussed - citalopram  (CELEXA)  20 MG tablet; Take 1 tablet (20 mg total) by mouth daily.  Dispense: 90 tablet; Refill: 1 - CMP14+EGFR; Future - CBC with Differential/Platelet; Future  5. Obesity, morbid ([place]) - CMP14+EGFR; Future - CBC with Differential/Platelet; Future   Labs pending Health Maintenance reviewed Diet and exercise encouraged  Follow up plan: 6 months      I discussed the assessment and treatment plan with the patient. The patient was provided an opportunity to ask questions and all were answered. The patient agreed with the plan and demonstrated an understanding of the instructions.   The patient was advised to call back or seek an in-person evaluation if the symptoms worsen or if the condition fails to improve as anticipated.  The above assessment and management plan was discussed with the patient. The patient verbalized understanding of and has agreed to the management plan. Patient is aware to call the clinic if symptoms persist or worsen. Patient is aware when to return to the clinic for a follow-up visit. Patient educated on when it is appropriate to go to the emergency department.   Time call ended:  12:30 pm   I provided 18 minutes of non-face-to-face time during this encounter.    Evelina Dun, FNP

## 2020-04-15 ENCOUNTER — Other Ambulatory Visit: Payer: Self-pay

## 2020-04-15 ENCOUNTER — Other Ambulatory Visit: Payer: Medicaid Other

## 2020-04-15 DIAGNOSIS — I10 Essential (primary) hypertension: Secondary | ICD-10-CM

## 2020-04-15 DIAGNOSIS — F331 Major depressive disorder, recurrent, moderate: Secondary | ICD-10-CM

## 2020-04-15 DIAGNOSIS — G43009 Migraine without aura, not intractable, without status migrainosus: Secondary | ICD-10-CM

## 2020-04-15 DIAGNOSIS — E782 Mixed hyperlipidemia: Secondary | ICD-10-CM

## 2020-04-16 LAB — CMP14+EGFR
ALT: 34 IU/L — ABNORMAL HIGH (ref 0–32)
AST: 31 IU/L (ref 0–40)
Albumin/Globulin Ratio: 1.3 (ref 1.2–2.2)
Albumin: 4.2 g/dL (ref 3.8–4.8)
Alkaline Phosphatase: 63 IU/L (ref 48–121)
BUN/Creatinine Ratio: 12 (ref 9–23)
BUN: 15 mg/dL (ref 6–24)
Bilirubin Total: 0.2 mg/dL (ref 0.0–1.2)
CO2: 28 mmol/L (ref 20–29)
Calcium: 9.7 mg/dL (ref 8.7–10.2)
Chloride: 101 mmol/L (ref 96–106)
Creatinine, Ser: 1.27 mg/dL — ABNORMAL HIGH (ref 0.57–1.00)
GFR calc Af Amer: 58 mL/min/{1.73_m2} — ABNORMAL LOW (ref >59)
GFR calc non Af Amer: 50 mL/min/{1.73_m2} — ABNORMAL LOW (ref >59)
Globulin, Total: 3.2 g/dL (ref 1.5–4.5)
Glucose: 82 mg/dL (ref 65–99)
Potassium: 3.5 mmol/L (ref 3.5–5.2)
Sodium: 139 mmol/L (ref 134–144)
Total Protein: 7.4 g/dL (ref 6.0–8.5)

## 2020-04-16 LAB — CBC WITH DIFFERENTIAL/PLATELET
Basophils Absolute: 0.1 10*3/uL (ref 0.0–0.2)
Basos: 1 %
EOS (ABSOLUTE): 0.1 10*3/uL (ref 0.0–0.4)
Eos: 2 %
Hematocrit: 35.5 % (ref 34.0–46.6)
Hemoglobin: 12.3 g/dL (ref 11.1–15.9)
Immature Grans (Abs): 0 10*3/uL (ref 0.0–0.1)
Immature Granulocytes: 0 %
Lymphocytes Absolute: 3.2 10*3/uL — ABNORMAL HIGH (ref 0.7–3.1)
Lymphs: 37 %
MCH: 29 pg (ref 26.6–33.0)
MCHC: 34.6 g/dL (ref 31.5–35.7)
MCV: 84 fL (ref 79–97)
Monocytes Absolute: 0.7 10*3/uL (ref 0.1–0.9)
Monocytes: 8 %
Neutrophils Absolute: 4.4 10*3/uL (ref 1.4–7.0)
Neutrophils: 52 %
Platelets: 285 10*3/uL (ref 150–450)
RBC: 4.24 x10E6/uL (ref 3.77–5.28)
RDW: 15 % (ref 11.7–15.4)
WBC: 8.5 10*3/uL (ref 3.4–10.8)

## 2020-04-16 LAB — LIPID PANEL
Chol/HDL Ratio: 5.2 ratio — ABNORMAL HIGH (ref 0.0–4.4)
Cholesterol, Total: 254 mg/dL — ABNORMAL HIGH (ref 100–199)
HDL: 49 mg/dL (ref >39)
LDL Chol Calc (NIH): 182 mg/dL — ABNORMAL HIGH (ref 0–99)
Triglycerides: 129 mg/dL (ref 0–149)
VLDL Cholesterol Cal: 23 mg/dL (ref 5–40)

## 2020-06-22 ENCOUNTER — Other Ambulatory Visit: Payer: Self-pay | Admitting: Family

## 2020-06-22 DIAGNOSIS — E782 Mixed hyperlipidemia: Secondary | ICD-10-CM

## 2020-06-22 DIAGNOSIS — F331 Major depressive disorder, recurrent, moderate: Secondary | ICD-10-CM

## 2020-08-18 ENCOUNTER — Ambulatory Visit (INDEPENDENT_AMBULATORY_CARE_PROVIDER_SITE_OTHER): Payer: Medicaid Other | Admitting: Family

## 2020-08-18 ENCOUNTER — Other Ambulatory Visit: Payer: Self-pay

## 2020-08-18 ENCOUNTER — Encounter: Payer: Self-pay | Admitting: Family

## 2020-08-18 DIAGNOSIS — E782 Mixed hyperlipidemia: Secondary | ICD-10-CM | POA: Diagnosis not present

## 2020-08-18 DIAGNOSIS — I10 Essential (primary) hypertension: Secondary | ICD-10-CM

## 2020-08-18 DIAGNOSIS — Z1159 Encounter for screening for other viral diseases: Secondary | ICD-10-CM

## 2020-08-18 DIAGNOSIS — F411 Generalized anxiety disorder: Secondary | ICD-10-CM

## 2020-08-18 DIAGNOSIS — F331 Major depressive disorder, recurrent, moderate: Secondary | ICD-10-CM

## 2020-08-18 DIAGNOSIS — Z1211 Encounter for screening for malignant neoplasm of colon: Secondary | ICD-10-CM

## 2020-08-18 MED ORDER — AMLODIPINE BESYLATE 10 MG PO TABS: 10.0000 mg | ORAL_TABLET | Freq: Every day | ORAL | 3 refills | Status: DC

## 2020-08-18 MED ORDER — METOPROLOL SUCCINATE ER 100 MG PO TB24: 100.0000 mg | ORAL_TABLET | Freq: Every day | ORAL | 3 refills | Status: DC

## 2020-08-18 MED ORDER — ATORVASTATIN CALCIUM 40 MG PO TABS: ORAL_TABLET | ORAL | 0 refills | Status: DC

## 2020-08-18 MED ORDER — LOSARTAN POTASSIUM 100 MG PO TABS: 100.0000 mg | ORAL_TABLET | Freq: Every day | ORAL | 3 refills | Status: DC

## 2020-08-18 NOTE — Progress Notes (Signed)
Subjective:    Patient ID: Kelli Walls, female    DOB: 25-Jan-1971, 50 y.o.   MRN: [id number]  Chief Complaint  Patient presents with  . Hypertension   Pt presents to the office today for chronic follow up. Hypertension This is a chronic problem. The current episode started more than 1 year ago. The problem has been resolved since onset. The problem is controlled. Associated symptoms include anxiety, malaise/fatigue and peripheral edema. Pertinent negatives include no shortness of breath. Risk factors for coronary artery disease include dyslipidemia, obesity and sedentary lifestyle. The current treatment provides moderate improvement.  Depression        This is a chronic problem.  The current episode started more than 1 year ago.   The onset quality is gradual.   The problem occurs intermittently.  Associated symptoms include irritable, restlessness and sad.  Associated symptoms include no helplessness and no hopelessness.  Past treatments include SSRIs - Selective serotonin reuptake inhibitors.  Past medical history includes anxiety.   Hyperlipidemia This is a chronic problem. The current episode started more than 1 year ago. The problem is uncontrolled. Recent lipid tests were reviewed and are high. Exacerbating diseases include obesity. Pertinent negatives include no shortness of breath. Current antihyperlipidemic treatment includes statins. The current treatment provides moderate improvement of lipids. Risk factors for coronary artery disease include dyslipidemia, hypertension, a sedentary lifestyle and post-menopausal.  Anxiety Presents for follow-up visit. Symptoms include depressed mood, irritability, nervous/anxious behavior and restlessness. Patient reports no shortness of breath. Symptoms occur most days.        Review of Systems  Constitutional: Positive for irritability and malaise/fatigue.  Respiratory: Negative for shortness of breath.   Psychiatric/Behavioral: Positive for  depression. The patient is nervous/anxious.   All other systems reviewed and are negative.      Objective:   Physical Exam Vitals reviewed.  Constitutional:      General: She is irritable. She is not in acute distress.    Appearance: She is well-developed and well-nourished. She is obese.  HENT:     Head: Normocephalic and atraumatic.     Right Ear: Tympanic membrane normal.     Left Ear: Tympanic membrane normal.     Mouth/Throat:     Mouth: Oropharynx is clear and moist.  Eyes:     Pupils: Pupils are equal, round, and reactive to light.  Neck:     Thyroid: No thyromegaly.  Cardiovascular:     Rate and Rhythm: Normal rate and regular rhythm.     Pulses: Intact distal pulses.     Heart sounds: Normal heart sounds. No murmur heard.   Pulmonary:     Effort: Pulmonary effort is normal. No respiratory distress.     Breath sounds: Normal breath sounds. No wheezing.  Abdominal:     General: Bowel sounds are normal. There is no distension.     Palpations: Abdomen is soft.     Tenderness: There is no abdominal tenderness.  Musculoskeletal:        General: No tenderness or edema. Normal range of motion.     Cervical back: Normal range of motion and neck supple.  Skin:    General: Skin is warm and dry.  Neurological:     Mental Status: She is alert and oriented to person, place, and time.     Cranial Nerves: No cranial nerve deficit.     Deep Tendon Reflexes: Reflexes are normal and symmetric.  Psychiatric:  Mood and Affect: Mood and affect normal.        Behavior: Behavior normal.        Thought Content: Thought content normal.        Judgment: Judgment normal.       BP 101/70   Pulse 72   Temp (!) 97.2 F (36.2 C) (Temporal)   Ht _0  (1.6 m)   Wt 243 lb 9.6 oz (110.5 kg)   LMP 09/07/2014   BMI 43.15 kg/m      Assessment & Plan:  Kelli Walls comes in today with chief complaint of Hypertension   Diagnosis and orders addressed:  1. Essential  hypertension - metoprolol succinate (TOPROL-XL) 100 MG 24 hr tablet; Take 1 tablet (100 mg total) by mouth daily. Take with or immediately following a meal.  Dispense: 90 tablet; Refill: 3 - losartan (COZAAR) 100 MG tablet; Take 1 tablet (100 mg total) by mouth daily.  Dispense: 90 tablet; Refill: 3 - amLODipine (NORVASC) 10 MG tablet; Take 1 tablet (10 mg total) by mouth daily.  Dispense: 90 tablet; Refill: 3 - CMP14+EGFR - CBC with Differential/Platelet  2. Mixed hyperlipidemia - atorvastatin (LIPITOR) 40 MG tablet; TAKE 1 TABLET BY MOUTH ONCE DAILY  Dispense: 90 tablet; Refill: 0 - CMP14+EGFR - CBC with Differential/Platelet - Lipid panel  3. Moderate episode of recurrent major depressive disorder (HCC) - CMP14+EGFR - CBC with Differential/Platelet  4. Obesity, morbid ([facility]) - CMP14+EGFR - CBC with Differential/Platelet  5. GAD (generalized anxiety disorder) - CMP14+EGFR - CBC with Differential/Platelet  6. Need for hepatitis C screening test - Hepatitis C antibody - CMP14+EGFR - CBC with Differential/Platelet  7. Colon cancer screening - Cologuard - CMP14+EGFR - CBC with Differential/Platelet   Labs pending Health Maintenance reviewed Diet and exercise encouraged  Follow up plan: 6 months    Evelina Dun, FNP

## 2020-08-18 NOTE — Patient Instructions (Signed)

## 2020-08-19 LAB — CBC WITH DIFFERENTIAL/PLATELET
Basophils Absolute: 0.1 10*3/uL (ref 0.0–0.2)
Basos: 1 %
EOS (ABSOLUTE): 0 10*3/uL (ref 0.0–0.4)
Eos: 0 %
Hematocrit: 36.7 % (ref 34.0–46.6)
Hemoglobin: 12.3 g/dL (ref 11.1–15.9)
Immature Grans (Abs): 0 10*3/uL (ref 0.0–0.1)
Immature Granulocytes: 0 %
Lymphocytes Absolute: 2.9 10*3/uL (ref 0.7–3.1)
Lymphs: 31 %
MCH: 28.3 pg (ref 26.6–33.0)
MCHC: 33.5 g/dL (ref 31.5–35.7)
MCV: 84 fL (ref 79–97)
Monocytes Absolute: 0.8 10*3/uL (ref 0.1–0.9)
Monocytes: 8 %
Neutrophils Absolute: 5.6 10*3/uL (ref 1.4–7.0)
Neutrophils: 60 %
Platelets: 261 10*3/uL (ref 150–450)
RBC: 4.35 x10E6/uL (ref 3.77–5.28)
RDW: 14.4 % (ref 11.7–15.4)
WBC: 9.5 10*3/uL (ref 3.4–10.8)

## 2020-08-19 LAB — CMP14+EGFR
ALT: 14 IU/L (ref 0–32)
AST: 19 IU/L (ref 0–40)
Albumin/Globulin Ratio: 1.5 (ref 1.2–2.2)
Albumin: 4.4 g/dL (ref 3.8–4.8)
Alkaline Phosphatase: 60 IU/L (ref 44–121)
BUN/Creatinine Ratio: 16 (ref 9–23)
BUN: 19 mg/dL (ref 6–24)
Bilirubin Total: 0.3 mg/dL (ref 0.0–1.2)
CO2: 29 mmol/L (ref 20–29)
Calcium: 9.5 mg/dL (ref 8.7–10.2)
Chloride: 101 mmol/L (ref 96–106)
Creatinine, Ser: 1.2 mg/dL — ABNORMAL HIGH (ref 0.57–1.00)
GFR calc Af Amer: 62 mL/min/{1.73_m2} (ref >59)
GFR calc non Af Amer: 54 mL/min/{1.73_m2} — ABNORMAL LOW (ref >59)
Globulin, Total: 2.9 g/dL (ref 1.5–4.5)
Glucose: 82 mg/dL (ref 65–99)
Potassium: 3.4 mmol/L — ABNORMAL LOW (ref 3.5–5.2)
Sodium: 138 mmol/L (ref 134–144)
Total Protein: 7.3 g/dL (ref 6.0–8.5)

## 2020-08-19 LAB — LIPID PANEL
Chol/HDL Ratio: 4.2 ratio (ref 0.0–4.4)
Cholesterol, Total: 186 mg/dL (ref 100–199)
HDL: 44 mg/dL (ref >39)
LDL Chol Calc (NIH): 104 mg/dL — ABNORMAL HIGH (ref 0–99)
Triglycerides: 221 mg/dL — ABNORMAL HIGH (ref 0–149)
VLDL Cholesterol Cal: 38 mg/dL (ref 5–40)

## 2020-08-19 LAB — HEPATITIS C ANTIBODY: Hep C Virus Ab: 0.3 s/co ratio (ref 0.0–0.9)

## 2020-09-07 ENCOUNTER — Other Ambulatory Visit: Payer: Self-pay | Admitting: Family

## 2020-09-07 DIAGNOSIS — Z1231 Encounter for screening mammogram for malignant neoplasm of breast: Secondary | ICD-10-CM

## 2020-09-21 ENCOUNTER — Other Ambulatory Visit: Payer: Self-pay | Admitting: Family

## 2020-09-21 DIAGNOSIS — I10 Essential (primary) hypertension: Secondary | ICD-10-CM

## 2020-10-10 ENCOUNTER — Other Ambulatory Visit (HOSPITAL_COMMUNITY)
Admission: RE | Admit: 2020-10-10 | Discharge: 2020-10-10 | Disposition: A | Discharge status: Home / Self Care | Payer: Medicaid Other | Source: Ambulatory Visit | Attending: Family Medicine | Admitting: Family Medicine

## 2020-10-10 ENCOUNTER — Encounter: Payer: Self-pay | Admitting: Family Medicine

## 2020-10-10 ENCOUNTER — Ambulatory Visit (INDEPENDENT_AMBULATORY_CARE_PROVIDER_SITE_OTHER): Payer: Medicaid Other | Admitting: Family Medicine

## 2020-10-10 ENCOUNTER — Telehealth: Payer: Self-pay

## 2020-10-10 ENCOUNTER — Other Ambulatory Visit: Payer: Self-pay

## 2020-10-10 VITALS — BP 123/86 | HR 79 | Ht 63.0 in | Wt 246.0 lb

## 2020-10-10 DIAGNOSIS — R102 Pelvic and perineal pain: Secondary | ICD-10-CM | POA: Insufficient documentation

## 2020-10-10 NOTE — Progress Notes (Signed)
BP 123/86   Pulse 79   Ht 5\' 3"  (1.6 m)   Wt 246 lb (111.6 kg)   LMP 09/07/2014   SpO2 96%   BMI 43.58 kg/m    Subjective:   Patient ID: Kelli Walls, female    DOB: July 18, 1971, 50 y.o.   MRN: [id number]  HPI: Kelli Walls is a 50 y.o. female presenting on 10/10/2020 for Abdominal Pain (Mid pelvic region. Comes and goes feels like cramps. Denies vaginal bleeding or discharge. No odor.)   HPI Patient is coming in today complaining of vaginal pain and lower pelvic pain that is been going on over the past 5 days.  She says it started after she had intercourse with her ex-husband who admittedly has intercourse with her exbest friend.  She says that she did not use protection or condoms when she had sex.  She denies any vaginal discharge or bleeding but does have pelvic pain.  She denies any urinary or urinary frequency or urinary symptoms.  Relevant past medical, surgical, family and social history reviewed and updated as indicated. Interim medical history since our last visit reviewed. Allergies and medications reviewed and updated.  Review of Systems  Constitutional: Negative for chills and fever.  Respiratory: Negative for chest tightness and shortness of breath.   Cardiovascular: Negative for chest pain and leg swelling.  Gastrointestinal: Positive for abdominal pain.  Genitourinary: Positive for pelvic pain, vaginal discharge and vaginal pain. Negative for difficulty urinating, dysuria, frequency, hematuria and vaginal bleeding.  Skin: Negative for rash.  Neurological: Negative for light-headedness and headaches.  Psychiatric/Behavioral: Negative for agitation and behavioral problems.  All other systems reviewed and are negative.   Per HPI unless specifically indicated above   Allergies as of 10/10/2020      Reactions   Penicillins Itching      Medication List       Accurate as of October 10, 2020  2:03 PM. If you have any questions, ask your nurse or doctor.         amLODipine 10 MG tablet Commonly known as: NORVASC Take 1 tablet (10 mg total) by mouth daily.   atorvastatin 40 MG tablet Commonly known as: LIPITOR TAKE 1 TABLET BY MOUTH ONCE DAILY   citalopram 20 MG tablet Commonly known as: CeleXA Take 1 tablet (20 mg total) by mouth daily.   hydrochlorothiazide 25 MG tablet Commonly known as: HYDRODIURIL TAKE 1 TABLET BY MOUTH DAILY   losartan 100 MG tablet Commonly known as: COZAAR Take 1 tablet (100 mg total) by mouth daily.   metoprolol succinate 100 MG 24 hr tablet Commonly known as: TOPROL-XL Take 1 tablet (100 mg total) by mouth daily. Take with or immediately following a meal.        Objective:   BP 123/86   Pulse 79   Ht 5\' 3"  (1.6 m)   Wt 246 lb (111.6 kg)   LMP 09/07/2014   SpO2 96%   BMI 43.58 kg/m   Wt Readings from Last 3 Encounters:  10/10/20 246 lb (111.6 kg)  08/18/20 243 lb 9.6 oz (110.5 kg)  01/03/20 249 lb 4.8 oz (113.1 kg)    Physical Exam Vitals and nursing note reviewed. Exam conducted with a chaperone present.  Constitutional:      General: She is not in acute distress.    Appearance: She is well-developed and well-nourished. She is not diaphoretic.  Eyes:     Extraocular Movements: EOM normal.  Cardiovascular:     Pulses:  Intact distal pulses.  Abdominal:     Tenderness: There is abdominal tenderness in the suprapubic area. There is no right CVA tenderness, left CVA tenderness, guarding or rebound.  Genitourinary:    Exam position: Lithotomy position.     Labia:        Right: No rash.        Left: No rash.      Vagina: Vaginal discharge and tenderness present. No erythema or bleeding.     Cervix: Discharge and erythema present. No cervical motion tenderness.     Uterus: Absent.      Adnexa:        Right: No mass or tenderness.         Left: No mass or tenderness.       Comments: Patient has a cervix that looks has been closed, no uterus Musculoskeletal:        General: No edema.   Skin:    General: Skin is warm and dry.     Findings: No rash.  Neurological:     Mental Status: She is alert and oriented to person, place, and time.     Coordination: Coordination normal.  Psychiatric:        Mood and Affect: Mood and affect normal.        Behavior: Behavior normal.       Assessment & Plan:   Problem List Items Addressed This Visit   None   Visit Diagnoses    Pelvic pain    -  Primary   Relevant Orders   Cervicovaginal ancillary only      Will send off for vaginal swab and STD testing, recommended protection using condoms will treat once the results return. Follow up plan: Return if symptoms worsen or fail to improve.  Counseling provided for all of the vaccine components No orders of the defined types were placed in this encounter.   Caryl Pina, MD [facility] 10/10/2020, 2:03 PM

## 2020-10-12 ENCOUNTER — Other Ambulatory Visit: Payer: Self-pay | Admitting: Family Medicine

## 2020-10-12 DIAGNOSIS — N760 Acute vaginitis: Secondary | ICD-10-CM

## 2020-10-12 DIAGNOSIS — B9689 Other specified bacterial agents as the cause of diseases classified elsewhere: Secondary | ICD-10-CM

## 2020-10-12 LAB — CERVICOVAGINAL ANCILLARY ONLY
Bacterial Vaginitis (gardnerella): POSITIVE — AB
Candida Glabrata: NEGATIVE
Candida Vaginitis: NEGATIVE
Chlamydia: NEGATIVE
Comment: NEGATIVE
Comment: NEGATIVE
Comment: NEGATIVE
Comment: NEGATIVE
Comment: NEGATIVE
Comment: NORMAL
Neisseria Gonorrhea: NEGATIVE
Trichomonas: NEGATIVE

## 2020-10-12 MED ORDER — METRONIDAZOLE 500 MG PO TABS: 500.0000 mg | ORAL_TABLET | Freq: Two times a day (BID) | ORAL | 0 refills | Status: DC

## 2020-10-12 NOTE — Progress Notes (Signed)
Patient aware and verbalized understanding. °

## 2020-10-12 NOTE — Progress Notes (Signed)
Please let the patient know that I sent metronidazole for her for bacterial vaginosis, the rest of her vaginal swab came back normal.  This is not an STD it is just an overgrowth

## 2020-10-24 ENCOUNTER — Other Ambulatory Visit: Payer: Self-pay

## 2020-10-24 ENCOUNTER — Ambulatory Visit
Admission: RE | Admit: 2020-10-24 | Discharge: 2020-10-24 | Disposition: A | Discharge status: Home / Self Care | Payer: Medicaid Other | Source: Ambulatory Visit | Attending: Family | Admitting: Family

## 2020-10-24 DIAGNOSIS — Z1231 Encounter for screening mammogram for malignant neoplasm of breast: Secondary | ICD-10-CM

## 2020-12-05 ENCOUNTER — Ambulatory Visit: Payer: Medicaid Other | Admitting: Nurse Practitioner

## 2020-12-06 ENCOUNTER — Encounter: Payer: Self-pay | Admitting: Nurse Practitioner

## 2020-12-06 ENCOUNTER — Other Ambulatory Visit: Payer: Self-pay

## 2020-12-06 ENCOUNTER — Ambulatory Visit (INDEPENDENT_AMBULATORY_CARE_PROVIDER_SITE_OTHER): Payer: Medicaid Other | Admitting: Nurse Practitioner

## 2020-12-06 ENCOUNTER — Encounter: Payer: Self-pay | Admitting: Family

## 2020-12-06 ENCOUNTER — Ambulatory Visit (INDEPENDENT_AMBULATORY_CARE_PROVIDER_SITE_OTHER): Payer: Medicaid Other

## 2020-12-06 ENCOUNTER — Encounter: Payer: Self-pay | Admitting: Family Medicine

## 2020-12-06 VITALS — BP 112/74 | HR 63 | Temp 97.7°F | Ht 63.0 in | Wt 251.0 lb

## 2020-12-06 DIAGNOSIS — W19XXXA Unspecified fall, initial encounter: Secondary | ICD-10-CM | POA: Insufficient documentation

## 2020-12-06 DIAGNOSIS — M25531 Pain in right wrist: Secondary | ICD-10-CM | POA: Diagnosis not present

## 2020-12-06 DIAGNOSIS — S6991XA Unspecified injury of right wrist, hand and finger(s), initial encounter: Secondary | ICD-10-CM | POA: Diagnosis not present

## 2020-12-06 NOTE — Progress Notes (Signed)
Acute Office Visit  Subjective:    Patient ID: Kelli Walls, female    DOB: May 21, 1971, 50 y.o.   MRN: [id number]  Chief Complaint  Patient presents with  . Wrist Pain  . Fall    HPI Patient is in today for Pain  She reports new onset  pain. was an injury that may have caused the pain. The pain started yesterday and is staying constant. The pain does radiate right arm. The pain is described as aching, is moderate in intensity, occurring constantly.  Aggravating factors: Movement Relieving factors: none.  She has tried application of ice with no relief.   ---------------------------------------------------------------------------------------------------   Past Medical History:  Diagnosis Date  . Asthma   . Depression   . Hyperlipidemia   . Hypertension   . Mental retardation, mild (I.Q. 50-70)   . Migraine   . Suicide attempt [facility])     Past Surgical History:  Procedure Laterality Date  . ABDOMINAL HYSTERECTOMY    . ENDOMETRIAL ABLATION    . TONSILLECTOMY      Family History  Problem Relation Age of Onset  . Diabetes Mother   . Hyperlipidemia Mother   . Breast cancer Maternal Aunt     Social History   Socioeconomic History  . Marital status: Divorced    Spouse name: Not on file  . Number of children: 0  . Years of education: 68  . Highest education level: Not on file  Occupational History  . Occupation: Unemployed   Tobacco Use  . Smoking status: Never Smoker  . Smokeless tobacco: Never Used  Vaping Use  . Vaping Use: Never used  Substance and Sexual Activity  . Alcohol use: No    Alcohol/week: 0.0 standard drinks    Comment: pt reports a history of drinking etoh but denies current use  . Drug use: No  . Sexual activity: Never  Other Topics Concern  . Not on file  Social History Narrative   Patient lives at home with her mother Mellody Dance.    Patient is single.    Patient has no children.    Patient has 12th grade education.    Social Determinants  of Health   Financial Resource Strain: Not on file  Food Insecurity: Not on file  Transportation Needs: Not on file  Physical Activity: Not on file  Stress: Not on file  Social Connections: Not on file  Intimate Partner Violence: Not on file    Outpatient Medications Prior to Visit  Medication Sig Dispense Refill  . amLODipine (NORVASC) 10 MG tablet Take 1 tablet (10 mg total) by mouth daily. 90 tablet 3  . atorvastatin (LIPITOR) 40 MG tablet TAKE 1 TABLET BY MOUTH ONCE DAILY 90 tablet 0  . citalopram (CELEXA) 20 MG tablet Take 1 tablet (20 mg total) by mouth daily. 90 tablet 1  . hydrochlorothiazide (HYDRODIURIL) 25 MG tablet TAKE 1 TABLET BY MOUTH DAILY 30 tablet 4  . losartan (COZAAR) 100 MG tablet Take 1 tablet (100 mg total) by mouth daily. 90 tablet 3  . metoprolol succinate (TOPROL-XL) 100 MG 24 hr tablet Take 1 tablet (100 mg total) by mouth daily. Take with or immediately following a meal. 90 tablet 3  . metroNIDAZOLE (FLAGYL) 500 MG tablet Take 1 tablet (500 mg total) by mouth 2 (two) times daily. 14 tablet 0  . ibuprofen (ADVIL) 800 MG tablet Take by mouth.     No facility-administered medications prior to visit.    Allergies  Allergen  Reactions  . Penicillins Itching    Review of Systems  Constitutional: Negative.   HENT: Negative.   Respiratory: Negative.   Cardiovascular: Negative.   Gastrointestinal: Negative.   Genitourinary: Negative.   Musculoskeletal: Positive for joint swelling.  Skin: Negative.   All other systems reviewed and are negative.      Objective:    Physical Exam Vitals and nursing note reviewed.  HENT:     Head: Normocephalic.     Nose: Nose normal.  Eyes:     Conjunctiva/sclera: Conjunctivae normal.  Cardiovascular:     Rate and Rhythm: Normal rate.     Pulses: Normal pulses.     Heart sounds: Normal heart sounds.  Pulmonary:     Effort: Pulmonary effort is normal.     Breath sounds: Normal breath sounds.  Abdominal:      General: Bowel sounds are normal.  Musculoskeletal:     Right wrist: Swelling and tenderness present.       Arms:     Comments: Right wrist swollen  Skin:    General: Skin is warm.  Neurological:     Mental Status: She is alert and oriented to person, place, and time.  Psychiatric:        Behavior: Behavior normal.     BP 112/74   Pulse 63   Temp 97.7 F (36.5 C) (Temporal)   Ht 5\' 3"  (1.6 m)   Wt 251 lb (113.9 kg)   LMP 09/07/2014   SpO2 98%   BMI 44.46 kg/m  Wt Readings from Last 3 Encounters:  12/06/20 251 lb (113.9 kg)  10/10/20 246 lb (111.6 kg)  08/18/20 243 lb 9.6 oz (110.5 kg)    There are no preventive care reminders to display for this patient.  There are no preventive care reminders to display for this patient.   No results found for: TSH Lab Results  Component Value Date   WBC 9.5 08/18/2020   HGB 12.3 08/18/2020   HCT 36.7 08/18/2020   MCV 84 08/18/2020   PLT 261 08/18/2020   Lab Results  Component Value Date   NA 138 08/18/2020   K 3.4 (L) 08/18/2020   CO2 29 08/18/2020   GLUCOSE 82 08/18/2020   BUN 19 08/18/2020   CREATININE 1.20 (H) 08/18/2020   BILITOT 0.3 08/18/2020   ALKPHOS 60 08/18/2020   AST 19 08/18/2020   ALT 14 08/18/2020   PROT 7.3 08/18/2020   ALBUMIN 4.4 08/18/2020   CALCIUM 9.5 08/18/2020   ANIONGAP 7 01/03/2020   Lab Results  Component Value Date   CHOL 186 08/18/2020   Lab Results  Component Value Date   HDL 44 08/18/2020   Lab Results  Component Value Date   LDLCALC 104 (H) 08/18/2020   Lab Results  Component Value Date   TRIG 221 (H) 08/18/2020   Lab Results  Component Value Date   CHOLHDL 4.2 08/18/2020   Lab Results  Component Value Date   HGBA1C 5.3% 12/19/2012       Assessment & Plan:   Problem List Items Addressed This Visit      Other   Fall - Primary    Reevaluate/assess fall prevention.      Relevant Orders   DG Wrist Complete Right   Injury of right wrist    Patient had a fall  in the last 24 hours hitting her right wrist while trying to break the fall.  Wrist is swollen and tender.  Completed x-ray which  results pending.  Patient unable to take ibuprofen or NSAIDs due to antidepressants.  Tylenol 500 mg tablet ordered, ice compress, elevate and stabilize joint.  Will treat appropriately pending x-ray results.  Follow-up with worsening unresolved symptoms          No orders of the defined types were placed in this encounter.    Ivy Lynn, NP

## 2020-12-06 NOTE — Patient Instructions (Signed)
Wrist Pain, Adult There are many things that can cause wrist pain. Some common causes include:  An injury to the wrist.  Using the joint too much.  A condition that causes too much pressure to be put on a nerve in the wrist (carpal tunnel syndrome).  Wear and tear of the joints that happens as a person gets older (osteoarthritis).  A condition that causes swelling and stiffness in the joints (arthritis). Sometimes, the cause of wrist pain is not known. Often, the pain goes away when you follow your doctor's instructions for easing pain at home. This may include resting your wrist, icing your wrist, or using a splint or an elastic wrap for a short time. It is important to tell your doctor if your wrist pain does not go away. Follow these instructions at home: If you have a splint or elastic wrap:  Wear the splint or wrap as told by your doctor. Take it off only as told by your doctor. Ask if you can take it off for bathing.  Loosen the splint or wrap if your fingers: ? Tingle. ? Become numb. ? Turn cold and blue.  Check the skin around the splint or wrap every day. Tell your doctor about any concerns.  Keep the splint or wrap clean.  If the splint or wrap is not waterproof: ? Do not let it get wet. ? Cover it with a watertight covering when you take a bath or shower. Managing pain, stiffness, and swelling  If told, put ice on the painful area. To do this: ? If you have a removable splint or wrap, take it off as told by your doctor. ? Put ice in a plastic bag. ? Place a towel between your skin and the bag or between your splint or wrap and the bag. ? Leave the ice on for 20 minutes, 2-3 times a day.  Move your fingers often.  Raise (elevate) the injured area above the level of your heart while you are sitting or lying down.   Activity  Rest your wrist as told by your doctor.  Return to your normal activities as told by your doctor. Ask your doctor what activities are safe  for you.  Ask your doctor when it is safe to drive if you have a splint or wrap on your wrist.  Do exercises as told by your doctor. General instructions  Pay attention to any changes in your symptoms.  Take over-the-counter and prescription medicines only as told by your doctor.  Keep all follow-up visits as told by your doctor. This is important. Contact a doctor if:  You have a sudden, sharp pain in the wrist, hand, or arm that is different or new.  The swelling or bruising on your wrist or hand gets worse.  Your skin: ? Becomes red. ? Gets a rash. ? Has open sores.  Your pain does not get better.  Your pain gets worse.  You have a fever or chills. Get help right away if:  You lose feeling in your fingers or hand.  Your fingers turn white, very red, or cold and blue.  You cannot move your fingers. Summary  There are many things that can cause wrist pain.  It is important to tell your doctor if your wrist pain does not go away.  You may need to wear a splint or a wrap for a short period of time.  Return to your normal activities as told by your doctor. Ask your doctor   what activities are safe for you. This information is not intended to replace advice given to you by your health care provider. Make sure you discuss any questions you have with your health care provider. Document Revised: 06/11/2019 Document Reviewed: 06/11/2019 Elsevier Patient Education  2021 Elsevier Inc.  

## 2020-12-06 NOTE — Assessment & Plan Note (Signed)
Patient had a fall in the last 24 hours hitting her right wrist while trying to break the fall.  Wrist is swollen and tender.  Completed x-ray which results pending.  Patient unable to take ibuprofen or NSAIDs due to antidepressants.  Tylenol 500 mg tablet ordered, ice compress, elevate and stabilize joint.  Will treat appropriately pending x-ray results.  Follow-up with worsening unresolved symptoms

## 2020-12-06 NOTE — Assessment & Plan Note (Signed)
Reevaluate/assess fall prevention.

## 2020-12-08 ENCOUNTER — Telehealth: Payer: Self-pay

## 2020-12-08 NOTE — Telephone Encounter (Signed)
Please review and call patient with xray results.

## 2020-12-08 NOTE — Telephone Encounter (Signed)
Waiting on report to come back - called [facility] to get them to check on it

## 2020-12-08 NOTE — Telephone Encounter (Signed)
Results not back - can you check on this?

## 2020-12-12 ENCOUNTER — Telehealth: Payer: Self-pay

## 2020-12-13 ENCOUNTER — Other Ambulatory Visit: Payer: Self-pay | Admitting: Nurse Practitioner

## 2020-12-13 DIAGNOSIS — S6991XD Unspecified injury of right wrist, hand and finger(s), subsequent encounter: Secondary | ICD-10-CM

## 2020-12-13 NOTE — Telephone Encounter (Signed)
rc for nurse please call [phone]

## 2020-12-13 NOTE — Telephone Encounter (Signed)
Pt aware of referral.

## 2020-12-13 NOTE — Telephone Encounter (Signed)
LMTCB

## 2020-12-28 ENCOUNTER — Ambulatory Visit: Payer: Medicaid Other | Attending: Nurse Practitioner | Admitting: Physical Therapy

## 2021-01-04 ENCOUNTER — Other Ambulatory Visit: Payer: Self-pay | Admitting: Family

## 2021-01-04 DIAGNOSIS — E782 Mixed hyperlipidemia: Secondary | ICD-10-CM

## 2021-01-24 ENCOUNTER — Encounter: Payer: Self-pay | Admitting: *Deleted

## 2021-02-16 ENCOUNTER — Ambulatory Visit: Payer: Medicaid Other | Admitting: Family

## 2021-03-02 ENCOUNTER — Ambulatory Visit: Payer: Medicaid Other | Admitting: Family

## 2021-03-02 ENCOUNTER — Encounter: Payer: Self-pay | Admitting: Family

## 2021-03-03 ENCOUNTER — Encounter: Payer: Self-pay | Admitting: Family

## 2021-04-10 ENCOUNTER — Other Ambulatory Visit: Payer: Self-pay | Admitting: Family

## 2021-04-10 DIAGNOSIS — I10 Essential (primary) hypertension: Secondary | ICD-10-CM

## 2021-04-28 ENCOUNTER — Encounter: Payer: Self-pay | Admitting: Nurse Practitioner

## 2021-04-28 ENCOUNTER — Other Ambulatory Visit: Payer: Self-pay

## 2021-04-28 ENCOUNTER — Ambulatory Visit (INDEPENDENT_AMBULATORY_CARE_PROVIDER_SITE_OTHER): Payer: Medicaid Other | Admitting: Nurse Practitioner

## 2021-04-28 VITALS — BP 152/92 | HR 64 | Temp 97.4°F | Ht 63.0 in

## 2021-04-28 DIAGNOSIS — M79672 Pain in left foot: Secondary | ICD-10-CM | POA: Diagnosis not present

## 2021-04-28 DIAGNOSIS — Z23 Encounter for immunization: Secondary | ICD-10-CM | POA: Diagnosis not present

## 2021-04-28 NOTE — Patient Instructions (Signed)
Foot Pain °Many things can cause foot pain. Some common causes are: °An injury. °A sprain. °Arthritis. °Blisters. °Bunions. °Follow these instructions at home: °Managing pain, stiffness, and swelling °If directed, put ice on the painful area: °Put ice in a plastic bag. °Place a towel between your skin and the bag. °Leave the ice on for 20 minutes, 2-3 times a day. ° °Activity °Do not stand or walk for long periods. °Return to your normal activities as told by your health care provider. Ask your health care provider what activities are safe for you. °Do stretches to relieve foot pain and stiffness as told by your health care provider. °Do not lift anything that is heavier than 10 lb (4.5 kg), or the limit that you are told, until your health care provider says that it is safe. Lifting a lot of weight can put added pressure on your feet. °Lifestyle °Wear comfortable, supportive shoes that fit you well. Do not wear high heels. °Keep your feet clean and dry. °General instructions °Take over-the-counter and prescription medicines only as told by your health care provider. °Rub your foot gently. °Pay attention to any changes in your symptoms. °Keep all follow-up visits as told by your health care provider. This is important. °Contact a health care provider if: °Your pain does not get better after a few days of self-care. °Your pain gets worse. °You cannot stand on your foot. °Get help right away if: °Your foot is numb or tingling. °Your foot or toes are swollen. °Your foot or toes turn white or blue. °You have warmth and redness along your foot. °Summary °Common causes of foot pain are injury, sprain, arthritis, blisters, or bunions. °Ice, medicines, and comfortable shoes may help foot pain. °Contact your health care provider if your pain does not get better after a few days of self-care. °This information is not intended to replace advice given to you by your health care provider. Make sure you discuss any questions you  have with your health care provider. °Document Revised: 10/26/2020 Document Reviewed: 10/26/2020 °Elsevier Patient Education © 2022 Elsevier Inc. ° °

## 2021-04-28 NOTE — Assessment & Plan Note (Signed)
Unresolved left heel pain.  Steroid shot in urgent care not therapeutic.  Completed referral to orthopedic.  Continue medication as prescribed, anti-inflammatory, elevating feet.  Education provided to patient printed handouts given.  Follow-up with worsening unresolved symptoms.

## 2021-04-28 NOTE — Progress Notes (Signed)
Acute Office Visit  Subjective:    Patient ID: Kelli Walls, female    DOB: [birth year], 50 y.o.   MRN: [id number]  Chief Complaint  Patient presents with   Foot Pain    HPI Patient is in today for Pain  She reports recurrent left foot pain. was not an injury that may have caused the pain. The pain started a few weeks ago and is worsening. The pain does not radiate . The pain is described as aching, soreness, and stabbing, is 7/10 in intensity, occurring constantly. Symptoms are worse in the: morning, mid-day  Aggravating factors: standing and walking Relieving factors: none.  She has tried l with mild resolution.Marland Kitchen   ---------------------------------------------------------------------------------------------------   Past Medical History:  Diagnosis Date   Asthma    Depression    Hyperlipidemia    Hypertension    Mental retardation, mild (I.Q. 50-70)    Migraine    Suicide attempt ([place])     Past Surgical History:  Procedure Laterality Date   ABDOMINAL HYSTERECTOMY     ENDOMETRIAL ABLATION     TONSILLECTOMY      Family History  Problem Relation Age of Onset   Diabetes Mother    Hyperlipidemia Mother    Breast cancer Maternal Aunt     Social History   Socioeconomic History   Marital status: Divorced    Spouse name: Not on file   Number of children: 0   Years of education: 12   Highest education level: Not on file  Occupational History   Occupation: Unemployed   Tobacco Use   Smoking status: Never   Smokeless tobacco: Never  Vaping Use   Vaping Use: Never used  Substance and Sexual Activity   Alcohol use: No    Alcohol/week: 0.0 standard drinks    Comment: pt reports a history of drinking etoh but denies current use   Drug use: No   Sexual activity: Never  Other Topics Concern   Not on file  Social History Narrative   Patient lives at home with her mother Mellody Dance.    Patient is single.    Patient has no children.    Patient has 12th grade education.     Social Determinants of Health   Financial Resource Strain: Not on file  Food Insecurity: Not on file  Transportation Needs: Not on file  Physical Activity: Not on file  Stress: Not on file  Social Connections: Not on file  Intimate Partner Violence: Not on file    Outpatient Medications Prior to Visit  Medication Sig Dispense Refill   amLODipine (NORVASC) 10 MG tablet Take 1 tablet (10 mg total) by mouth daily. 90 tablet 3   atorvastatin (LIPITOR) 40 MG tablet TAKE 1 TABLET BY MOUTH DAILY 90 tablet 0   citalopram (CELEXA) 20 MG tablet Take 1 tablet (20 mg total) by mouth daily. 90 tablet 1   hydrochlorothiazide (HYDRODIURIL) 25 MG tablet Take 1 tablet (25 mg total) by mouth daily. 30 tablet 0   ibuprofen (ADVIL) 800 MG tablet Take by mouth.     losartan (COZAAR) 100 MG tablet Take 1 tablet (100 mg total) by mouth daily. 90 tablet 3   metoprolol succinate (TOPROL-XL) 100 MG 24 hr tablet Take 1 tablet (100 mg total) by mouth daily. Take with or immediately following a meal. 90 tablet 3   metroNIDAZOLE (FLAGYL) 500 MG tablet Take 1 tablet (500 mg total) by mouth 2 (two) times daily. 14 tablet 0   No facility-administered  medications prior to visit.    Allergies  Allergen Reactions   Penicillins Itching    Review of Systems  Constitutional: Negative.   HENT: Negative.    Respiratory: Negative.    Cardiovascular: Negative.   Gastrointestinal: Negative.   Musculoskeletal:        Heel pain  Skin:  Negative for rash.  Psychiatric/Behavioral: Negative.    All other systems reviewed and are negative.     Objective:    Physical Exam Vitals and nursing note reviewed.  Constitutional:      Appearance: Normal appearance.  HENT:     Head: Normocephalic.     Mouth/Throat:     Mouth: Mucous membranes are moist.     Pharynx: Oropharynx is clear.  Eyes:     Conjunctiva/sclera: Conjunctivae normal.  Cardiovascular:     Rate and Rhythm: Normal rate and regular rhythm.      Pulses: Normal pulses.     Heart sounds: Normal heart sounds.  Pulmonary:     Effort: Pulmonary effort is normal.     Breath sounds: Normal breath sounds.  Abdominal:     General: Bowel sounds are normal.  Musculoskeletal:     Left foot: Tenderness present.     Comments: Left heal pain  Skin:    Findings: No rash.  Neurological:     Mental Status: She is alert and oriented to person, place, and time.  Psychiatric:        Behavior: Behavior normal.    BP (!) 152/92   Pulse 64   Temp (!) 97.4 F (36.3 C) (Temporal)   Ht 5\' 3"  (1.6 m)   LMP 09/07/2014   SpO2 97%   BMI 44.46 kg/m  Wt Readings from Last 3 Encounters:  12/06/20 251 lb (113.9 kg)  10/10/20 246 lb (111.6 kg)  08/18/20 243 lb 9.6 oz (110.5 kg)    Health Maintenance Due  Topic Date Due   COVID-19 Vaccine (4 - Booster for Moderna series) 09/13/2020    There are no preventive care reminders to display for this patient.   No results found for: TSH Lab Results  Component Value Date   WBC 9.5 08/18/2020   HGB 12.3 08/18/2020   HCT 36.7 08/18/2020   MCV 84 08/18/2020   PLT 261 08/18/2020   Lab Results  Component Value Date   NA 138 08/18/2020   K 3.4 (L) 08/18/2020   CO2 29 08/18/2020   GLUCOSE 82 08/18/2020   BUN 19 08/18/2020   CREATININE 1.20 (H) 08/18/2020   BILITOT 0.3 08/18/2020   ALKPHOS 60 08/18/2020   AST 19 08/18/2020   ALT 14 08/18/2020   PROT 7.3 08/18/2020   ALBUMIN 4.4 08/18/2020   CALCIUM 9.5 08/18/2020   ANIONGAP 7 01/03/2020   Lab Results  Component Value Date   CHOL 186 08/18/2020   Lab Results  Component Value Date   HDL 44 08/18/2020   Lab Results  Component Value Date   LDLCALC 104 (H) 08/18/2020   Lab Results  Component Value Date   TRIG 221 (H) 08/18/2020   Lab Results  Component Value Date   CHOLHDL 4.2 08/18/2020   Lab Results  Component Value Date   HGBA1C 5.3% 12/19/2012       Assessment & Plan:   Problem List Items Addressed This Visit        Other   Pain of left heel - Primary    Unresolved left heel pain.  Steroid shot in urgent care  not therapeutic.  Completed referral to orthopedic.  Continue medication as prescribed, anti-inflammatory, elevating feet.  Education provided to patient printed handouts given.  Follow-up with worsening unresolved symptoms.      Relevant Orders   AMB referral to orthopedics   Other Visit Diagnoses     Need for immunization against influenza       Relevant Orders   Flu Vaccine QUAD 53mo+IM (Fluarix, Fluzone & Alfiuria Quad PF) (Completed)        No orders of the defined types were placed in this encounter.    Ivy Lynn, NP

## 2021-05-04 ENCOUNTER — Ambulatory Visit (INDEPENDENT_AMBULATORY_CARE_PROVIDER_SITE_OTHER): Payer: Medicaid Other | Admitting: Physician Assistant

## 2021-05-04 ENCOUNTER — Ambulatory Visit: Payer: Self-pay

## 2021-05-04 DIAGNOSIS — M79672 Pain in left foot: Secondary | ICD-10-CM | POA: Diagnosis not present

## 2021-05-04 NOTE — Addendum Note (Signed)
Addended by: Marlyne Beards on: 05/04/2021 11:46 AM   Modules accepted: Orders

## 2021-05-04 NOTE — Progress Notes (Signed)
Office Visit Note   Patient: Kelli Walls           Date of Birth: Feb 01, 1971           MRN: [id number] Visit Date: 05/04/2021              Requested by: Ivy Lynn, NP [address] [place],  [place] [address] PCP: Sharion Balloon, FNP  Chief Complaint  Patient presents with   Left Foot - Pain      HPI: Patient is a pleasant 50 year old woman who presents today with a chief complaint of left heel pain as well as generalized pain and swelling in her left foot and ankle.  She is not a diabetic that she knows of.  She said this began about a week ago she denies any injury.  She was seen and evaluated in urgent care who gave her an injection into the plantar fascia origin at the heel she thinks this helped a little bit.  They also gave her a cam walker boot.  She has a family history of gout in her mother  Assessment & Plan: Visit Diagnoses:  1. Pain in left foot     Plan: Patient should obtain a supportive arch to place in the boot I think she will find this more comfortable.  We also talked about a stiff tennis shoe with arch support in it.  I will go forward and draw a uric acid given her family history and symptoms cannot rule out a gouty flare.  Follow-up in 3 weeks  Follow-Up Instructions: No follow-ups on file.   Ortho Exam  Patient is alert, oriented, no adenopathy, well-dressed, normal affect, normal respiratory effort. Examination she has an easily palpable dorsalis pedis pulse.  She has good active dorsiflexion plantarflexion eversion and inversion.  She is tender over the plantar origin of the plantar fascia medially.  She also has generalized tenderness with ankle range of motion and manipulation of her foot.  No signs of infection or ascending cellulitis  Imaging: XR Foot Complete Left  Result Date: 05/04/2021 Exam x-rays of her left foot demonstrate well-maintained alignment.  She does have some spurring at the talonavicular joint.  Also large calcaneal spur..   No acute osseous changes  No images are attached to the encounter.  Labs: Lab Results  Component Value Date   HGBA1C 5.3% 12/19/2012     Lab Results  Component Value Date   ALBUMIN 4.4 08/18/2020   ALBUMIN 4.2 04/15/2020   ALBUMIN 4.1 10/01/2019    Lab Results  Component Value Date   MG 2.1 05/23/2013   MG 1.9 05/21/2013   No results found for: VD25OH  No results found for: PREALBUMIN CBC EXTENDED Latest Ref Rng & Units 08/18/2020 04/15/2020 01/03/2020  WBC 3.4 - 10.8 x10E3/uL 9.5 8.5 8.9  RBC 3.77 - 5.28 x10E6/uL 4.35 4.24 4.53  HGB 11.1 - 15.9 g/dL 12.3 12.3 12.6  HCT 34.0 - 46.6 % 36.7 35.5 39.6  PLT 150 - 450 x10E3/uL 261 285 239  NEUTROABS 1.4 - 7.0 x10E3/uL 5.6 4.4 -  LYMPHSABS 0.7 - 3.1 x10E3/uL 2.9 3.2(H) -     There is no height or weight on file to calculate BMI.  Orders:  Orders Placed This Encounter  Procedures   XR Foot Complete Left   No orders of the defined types were placed in this encounter.    Procedures: No procedures performed  Clinical Data: No additional findings.  ROS:  All other  systems negative, except as noted in the HPI. Review of Systems  Objective: Vital Signs: LMP 09/07/2014   Specialty Comments:  No specialty comments available.  PMFS History: Patient Active Problem List   Diagnosis Date Noted   Pain of left heel 04/28/2021   Fall 12/06/2020   Injury of right wrist 12/06/2020   GAD (generalized anxiety disorder) 08/18/2020   Seborrheic dermatitis 12/11/2018   Hyperlipemia 04/16/2016   Essential hypertension 67/61/9509   Metabolic syndrome 32/67/1245   Major depression, recurrent ([place]) 05/24/2013   Prolonged Q-T interval on ECG 05/22/2013   Suicide attempt by other psychotropic drug overdose ([place]) 05/21/2013   Acute renal insufficiency 05/21/2013   Hypokalemia 05/21/2013   Migraine headache 04/20/2013   Obesity, morbid ([place]) 02/02/2013   Past Medical History:  Diagnosis Date   Asthma    Depression     Hyperlipidemia    Hypertension    Mental retardation, mild (I.Q. 50-70)    Migraine    Suicide attempt ([place])     Family History  Problem Relation Age of Onset   Diabetes Mother    Hyperlipidemia Mother    Breast cancer Maternal Aunt     Past Surgical History:  Procedure Laterality Date   ABDOMINAL HYSTERECTOMY     ENDOMETRIAL ABLATION     TONSILLECTOMY     Social History   Occupational History   Occupation: Unemployed   Tobacco Use   Smoking status: Never   Smokeless tobacco: Never  Vaping Use   Vaping Use: Never used  Substance and Sexual Activity   Alcohol use: No    Alcohol/week: 0.0 standard drinks    Comment: pt reports a history of drinking etoh but denies current use   Drug use: No   Sexual activity: Never

## 2021-05-05 LAB — URIC ACID: Uric Acid, Serum: 6.9 mg/dL (ref 2.5–7.0)

## 2021-05-10 ENCOUNTER — Telehealth: Payer: Self-pay | Admitting: Orthopedic Surgery

## 2021-05-10 NOTE — Telephone Encounter (Signed)
Pt's uric acid results were 6.9  any suggestions for pt is is calling for the result.

## 2021-05-10 NOTE — Telephone Encounter (Signed)
Can just tell her its normal, likely she doesn't have gout

## 2021-05-10 NOTE — Telephone Encounter (Signed)
Pt is calling about results to gout test?   CB [phone]

## 2021-05-11 NOTE — Telephone Encounter (Signed)
I called pt at number provided fax machine sound. Will try again.

## 2021-05-12 NOTE — Telephone Encounter (Signed)
I called pt and advised of message below. Offered to move appt up to next week but pt declined and states that will come in on 05/26/21

## 2021-05-26 ENCOUNTER — Encounter: Payer: Self-pay | Admitting: Family

## 2021-05-26 ENCOUNTER — Ambulatory Visit (INDEPENDENT_AMBULATORY_CARE_PROVIDER_SITE_OTHER): Payer: Medicaid Other | Admitting: Family

## 2021-05-26 DIAGNOSIS — M19079 Primary osteoarthritis, unspecified ankle and foot: Secondary | ICD-10-CM | POA: Diagnosis not present

## 2021-05-26 DIAGNOSIS — M79672 Pain in left foot: Secondary | ICD-10-CM | POA: Diagnosis not present

## 2021-05-31 DIAGNOSIS — M19079 Primary osteoarthritis, unspecified ankle and foot: Secondary | ICD-10-CM | POA: Diagnosis not present

## 2021-05-31 MED ORDER — METHYLPREDNISOLONE ACETATE 40 MG/ML IJ SUSP
40.0000 mg | INTRAMUSCULAR | Status: AC | PRN
  Administered 2021-05-31: 40 mg via INTRA_ARTICULAR

## 2021-05-31 MED ORDER — LIDOCAINE HCL 1 % IJ SOLN
2.0000 mL | INTRAMUSCULAR | Status: AC | PRN
  Administered 2021-05-31: 2 mL

## 2021-05-31 NOTE — Progress Notes (Signed)
Office Visit Note   Patient: Kelli Walls           Date of Birth: [birth year]           MRN: [id number] Visit Date: 05/26/2021              Requested by: Sharion Balloon, [place],  [place] [address] PCP: Sharion Balloon, FNP  Chief Complaint  Patient presents with   Left Foot - Pain, Follow-up      HPI: The patient is a 50 year old woman who presents today in follow-up.  She has been followed for left foot and ankle pain.  She has had some waxing and waning swelling of her foot and ankle she did have some heel pain at 1 point this is no longer bothering her.  Pain with weightbearing deep ankle pain and some medial ankle pain  Has a family history of gout.  Her uric acid was drawn at last visit and was in normal range.  At 1 point she was placed in a cam walker which did provide some relief of her pain symptoms.  She has resumed regular shoewear  Unfortunately she has not been able to obtain orthotics or new supportive shoe wear  She does wonder if this is related to an ankle fracture she had as a child    Assessment & Plan: Visit Diagnoses:  1. Ankle arthritis     Plan: Depo-Medrol injection of her ankle today will see how she does again discussed the importance of supportive shoe wear  Follow-Up Instructions: Return in about 4 weeks (around 06/23/2021), or if symptoms worsen or fail to improve.   Right Ankle Exam   Tenderness  Right ankle tenderness location: Sinus Tarsi. Swelling: mild  Range of Motion  The patient has normal right ankle ROM.  Other  Erythema: absent Sensation: normal Pulse: present      Patient is alert, oriented, no adenopathy, well-dressed, normal affect, normal respiratory effort. Diffuse ankle pain with range of motion of her foot and ankle.  No plantar fascia or plantar heel tenderness.  Does have a palpable dorsalis pedis pulse.  Imaging: No results found. No images are attached to the  encounter.  Labs: Lab Results  Component Value Date   HGBA1C 5.3% 12/19/2012   LABURIC 6.9 05/04/2021     Lab Results  Component Value Date   ALBUMIN 4.4 08/18/2020   ALBUMIN 4.2 04/15/2020   ALBUMIN 4.1 10/01/2019    Lab Results  Component Value Date   MG 2.1 05/23/2013   MG 1.9 05/21/2013   No results found for: VD25OH  No results found for: PREALBUMIN CBC EXTENDED Latest Ref Rng & Units 08/18/2020 04/15/2020 01/03/2020  WBC 3.4 - 10.8 x10E3/uL 9.5 8.5 8.9  RBC 3.77 - 5.28 x10E6/uL 4.35 4.24 4.53  HGB 11.1 - 15.9 g/dL 12.3 12.3 12.6  HCT 34.0 - 46.6 % 36.7 35.5 39.6  PLT 150 - 450 x10E3/uL 261 285 239  NEUTROABS 1.4 - 7.0 x10E3/uL 5.6 4.4 -  LYMPHSABS 0.7 - 3.1 x10E3/uL 2.9 3.2(H) -     There is no height or weight on file to calculate BMI.  Orders:  Orders Placed This Encounter  Procedures   Medium Joint Inj   No orders of the defined types were placed in this encounter.    Procedures: Medium Joint Inj: L ankle on 05/31/2021 8:59 AM Indications: pain Details: 25 G needle, anterolateral approach Medications: 2 mL lidocaine 1 %; 40  mg methylPREDNISolone acetate 40 MG/ML Outcome: tolerated well, no immediate complications Consent was given by the patient. Patient was prepped and draped in the usual sterile fashion.     Clinical Data: No additional findings.  ROS:  All other systems negative, except as noted in the HPI. Review of Systems  Objective: Vital Signs: LMP 09/07/2014   Specialty Comments:  No specialty comments available.  PMFS History: Patient Active Problem List   Diagnosis Date Noted   Pain of left heel 04/28/2021   Fall 12/06/2020   Injury of right wrist 12/06/2020   GAD (generalized anxiety disorder) 08/18/2020   Seborrheic dermatitis 12/11/2018   Hyperlipemia 04/16/2016   Essential hypertension 95/32/0233   Metabolic syndrome 43/56/8616   Major depression, recurrent ([place]) 05/24/2013   Prolonged Q-T interval on ECG  05/22/2013   Suicide attempt by other psychotropic drug overdose ([place]) 05/21/2013   Acute renal insufficiency 05/21/2013   Hypokalemia 05/21/2013   Migraine headache 04/20/2013   Obesity, morbid ([place]) 02/02/2013   Past Medical History:  Diagnosis Date   Asthma    Depression    Hyperlipidemia    Hypertension    Mental retardation, mild (I.Q. 50-70)    Migraine    Suicide attempt ([place])     Family History  Problem Relation Age of Onset   Diabetes Mother    Hyperlipidemia Mother    Breast cancer Maternal Aunt     Past Surgical History:  Procedure Laterality Date   ABDOMINAL HYSTERECTOMY     ENDOMETRIAL ABLATION     TONSILLECTOMY     Social History   Occupational History   Occupation: Unemployed   Tobacco Use   Smoking status: Never   Smokeless tobacco: Never  Vaping Use   Vaping Use: Never used  Substance and Sexual Activity   Alcohol use: No    Alcohol/week: 0.0 standard drinks    Comment: pt reports a history of drinking etoh but denies current use   Drug use: No   Sexual activity: Never

## 2021-06-09 ENCOUNTER — Other Ambulatory Visit: Payer: Self-pay | Admitting: Family

## 2021-06-09 DIAGNOSIS — I10 Essential (primary) hypertension: Secondary | ICD-10-CM

## 2021-06-09 DIAGNOSIS — E782 Mixed hyperlipidemia: Secondary | ICD-10-CM

## 2021-06-13 ENCOUNTER — Ambulatory Visit: Payer: Medicaid Other | Admitting: Family

## 2021-06-19 ENCOUNTER — Ambulatory Visit (INDEPENDENT_AMBULATORY_CARE_PROVIDER_SITE_OTHER): Payer: Medicaid Other | Admitting: Orthopedic Surgery

## 2021-06-19 ENCOUNTER — Ambulatory Visit: Payer: Medicaid Other | Admitting: Family

## 2021-06-19 ENCOUNTER — Other Ambulatory Visit: Payer: Self-pay

## 2021-06-19 DIAGNOSIS — M79672 Pain in left foot: Secondary | ICD-10-CM

## 2021-06-19 DIAGNOSIS — M76822 Posterior tibial tendinitis, left leg: Secondary | ICD-10-CM | POA: Diagnosis not present

## 2021-06-19 DIAGNOSIS — M19079 Primary osteoarthritis, unspecified ankle and foot: Secondary | ICD-10-CM | POA: Diagnosis not present

## 2021-07-07 ENCOUNTER — Ambulatory Visit: Payer: Medicaid Other | Admitting: Family

## 2021-07-07 ENCOUNTER — Encounter: Payer: Self-pay | Admitting: Family

## 2021-07-07 VITALS — BP 139/89 | HR 76 | Temp 97.5°F | Ht 63.0 in | Wt 236.2 lb

## 2021-07-07 DIAGNOSIS — F411 Generalized anxiety disorder: Secondary | ICD-10-CM | POA: Diagnosis not present

## 2021-07-07 DIAGNOSIS — F331 Major depressive disorder, recurrent, moderate: Secondary | ICD-10-CM | POA: Diagnosis not present

## 2021-07-07 DIAGNOSIS — E782 Mixed hyperlipidemia: Secondary | ICD-10-CM

## 2021-07-07 DIAGNOSIS — I10 Essential (primary) hypertension: Secondary | ICD-10-CM | POA: Diagnosis not present

## 2021-07-07 NOTE — Patient Instructions (Signed)

## 2021-07-07 NOTE — Progress Notes (Signed)
Subjective:    Patient ID: Kelli Walls, female    DOB: [birth year], 50 y.o.   MRN: [id number]  Chief Complaint  Patient presents with   Medical Management of Chronic Issues   Pt presents to the office today for chronic follow up. She is followed by Podiatry every 4 weeks for heel spur.  Hypertension This is a chronic problem. The current episode started more than 1 year ago. The problem has been waxing and waning since onset. The problem is uncontrolled. Associated symptoms include anxiety, malaise/fatigue and peripheral edema. Pertinent negatives include no shortness of breath. Risk factors for coronary artery disease include dyslipidemia, obesity and sedentary lifestyle. The current treatment provides moderate improvement.  Depression        This is a chronic problem.  The current episode started more than 1 year ago.   The problem occurs intermittently.  Associated symptoms include sad.  Associated symptoms include no helplessness, no hopelessness, not irritable and no restlessness.  Past treatments include SSRIs - Selective serotonin reuptake inhibitors.  Compliance with treatment is good.  Past medical history includes anxiety.   Hyperlipidemia This is a chronic problem. The current episode started more than 1 year ago. The problem is controlled. Recent lipid tests were reviewed and are normal. Exacerbating diseases include obesity. Pertinent negatives include no shortness of breath. Current antihyperlipidemic treatment includes statins. The current treatment provides moderate improvement of lipids. Risk factors for coronary artery disease include dyslipidemia, hypertension, a sedentary lifestyle and post-menopausal.  Anxiety Presents for follow-up visit. Symptoms include depressed mood, excessive worry, irritability and nervous/anxious behavior. Patient reports no restlessness or shortness of breath. Symptoms occur occasionally. The severity of symptoms is moderate.       Review of  Systems  Constitutional:  Positive for irritability and malaise/fatigue.  Respiratory:  Negative for shortness of breath.   Psychiatric/Behavioral:  Positive for depression. The patient is nervous/anxious.   All other systems reviewed and are negative.     Objective:   Physical Exam Vitals reviewed.  Constitutional:      General: She is not irritable.She is not in acute distress.    Appearance: She is well-developed. She is obese.  HENT:     Head: Normocephalic and atraumatic.     Right Ear: Tympanic membrane normal.     Left Ear: Tympanic membrane normal.  Eyes:     Pupils: Pupils are equal, round, and reactive to light.  Neck:     Thyroid: No thyromegaly.  Cardiovascular:     Rate and Rhythm: Normal rate and regular rhythm.     Heart sounds: Normal heart sounds. No murmur heard. Pulmonary:     Effort: Pulmonary effort is normal. No respiratory distress.     Breath sounds: Normal breath sounds. No wheezing.  Abdominal:     General: Bowel sounds are normal. There is no distension.     Palpations: Abdomen is soft.     Tenderness: There is no abdominal tenderness.  Musculoskeletal:        General: No tenderness. Normal range of motion.     Cervical back: Normal range of motion and neck supple.  Skin:    General: Skin is warm and dry.  Neurological:     Mental Status: She is alert and oriented to person, place, and time.     Cranial Nerves: No cranial nerve deficit.     Deep Tendon Reflexes: Reflexes are normal and symmetric.  Psychiatric:        Behavior: Behavior  normal.        Thought Content: Thought content normal.        Judgment: Judgment normal.      BP 139/89   Pulse 76   Temp (!) 97.5 F (36.4 C)   Ht 5' 3"  (1.6 m)   Wt 236 lb 3.2 oz (107.1 kg)   LMP 09/07/2014   SpO2 98%   BMI 41.84 kg/m      Assessment & Plan:  Jaiyanna Safran comes in today with chief complaint of Medical Management of Chronic Issues   Diagnosis and orders addressed:  1.  Essential hypertension - CMP14+EGFR - CBC with Differential/Platelet  2. GAD (generalized anxiety disorder) - CMP14+EGFR - CBC with Differential/Platelet  3. Obesity, morbid ([place]) - CMP14+EGFR - CBC with Differential/Platelet  4. Moderate episode of recurrent major depressive disorder ([facility]) - CMP14+EGFR - CBC with Differential/Platelet  5. Mixed hyperlipidemia - CMP14+EGFR - CBC with Differential/Platelet   Labs pending Health Maintenance reviewed Diet and exercise encouraged  Follow up plan: 6 months    Evelina Dun, FNP

## 2021-07-08 LAB — CBC WITH DIFFERENTIAL/PLATELET
Basophils Absolute: 0 10*3/uL (ref 0.0–0.2)
Basos: 0 %
EOS (ABSOLUTE): 0 10*3/uL (ref 0.0–0.4)
Eos: 0 %
Hematocrit: 41.5 % (ref 34.0–46.6)
Hemoglobin: 13.6 g/dL (ref 11.1–15.9)
Immature Grans (Abs): 0 10*3/uL (ref 0.0–0.1)
Immature Granulocytes: 0 %
Lymphocytes Absolute: 1.9 10*3/uL (ref 0.7–3.1)
Lymphs: 16 %
MCH: 28 pg (ref 26.6–33.0)
MCHC: 32.8 g/dL (ref 31.5–35.7)
MCV: 85 fL (ref 79–97)
Monocytes Absolute: 1.1 10*3/uL — ABNORMAL HIGH (ref 0.1–0.9)
Monocytes: 9 %
Neutrophils Absolute: 8.8 10*3/uL — ABNORMAL HIGH (ref 1.4–7.0)
Neutrophils: 75 %
Platelets: 377 10*3/uL (ref 150–450)
RBC: 4.86 x10E6/uL (ref 3.77–5.28)
RDW: 15.2 % (ref 11.7–15.4)
WBC: 11.9 10*3/uL — ABNORMAL HIGH (ref 3.4–10.8)

## 2021-07-08 LAB — CMP14+EGFR
ALT: 12 IU/L (ref 0–32)
AST: 9 IU/L (ref 0–40)
Albumin/Globulin Ratio: 1.4 (ref 1.2–2.2)
Albumin: 4 g/dL (ref 3.8–4.8)
Alkaline Phosphatase: 57 IU/L (ref 44–121)
BUN/Creatinine Ratio: 17 (ref 9–23)
BUN: 26 mg/dL — ABNORMAL HIGH (ref 6–24)
Bilirubin Total: <0.2 mg/dL (ref 0.0–1.2)
CO2: 29 mmol/L (ref 20–29)
Calcium: 9.8 mg/dL (ref 8.7–10.2)
Chloride: 100 mmol/L (ref 96–106)
Creatinine, Ser: 1.52 mg/dL — ABNORMAL HIGH (ref 0.57–1.00)
Globulin, Total: 2.9 g/dL (ref 1.5–4.5)
Glucose: 67 mg/dL — ABNORMAL LOW (ref 70–99)
Potassium: 3.6 mmol/L (ref 3.5–5.2)
Sodium: 142 mmol/L (ref 134–144)
Total Protein: 6.9 g/dL (ref 6.0–8.5)
eGFR: 42 mL/min/{1.73_m2} — ABNORMAL LOW (ref >59)

## 2021-07-10 ENCOUNTER — Other Ambulatory Visit: Payer: Self-pay | Admitting: Family

## 2021-07-10 ENCOUNTER — Ambulatory Visit: Payer: Medicaid Other | Admitting: Orthopedic Surgery

## 2021-07-10 DIAGNOSIS — N1832 Chronic kidney disease, stage 3b: Secondary | ICD-10-CM

## 2021-07-19 ENCOUNTER — Telehealth: Payer: Self-pay | Admitting: Family

## 2021-07-19 NOTE — Telephone Encounter (Signed)
Dawn called from [facility] to let our Referral Dept know that referral for pt was sent to them, but based on the referral, it needs to be sent to [facility] with Dr Theador Hawthorne.  Please send to correct location.

## 2021-07-25 ENCOUNTER — Other Ambulatory Visit: Payer: Self-pay | Admitting: Family

## 2021-07-25 DIAGNOSIS — I10 Essential (primary) hypertension: Secondary | ICD-10-CM

## 2021-07-26 ENCOUNTER — Encounter: Payer: Self-pay | Admitting: Orthopedic Surgery

## 2021-07-26 NOTE — Progress Notes (Signed)
Office Visit Note   Patient: Kelli Walls           Date of Birth: May 14, 1971           MRN: [id number] Visit Date: 06/19/2021              Requested by: Sharion Balloon, [place],  [place] [address] PCP: Sharion Balloon, FNP  Chief Complaint  Patient presents with   Right Foot - Follow-up   Right Ankle - Follow-up      HPI: Patient is a 50 year old woman who is seen for left foot and ankle pain and swelling.  Patient states she is about 3 weeks status post left ankle injection.  She also complains of swelling in the right wrist status post a fall.  Assessment & Plan: Visit Diagnoses:  1. Pain in left foot   2. Ankle arthritis     Plan: We will try an ankle stabilizing orthosis to help with her posterior tibial tendon insufficiency.  Follow-Up Instructions: Return in about 4 weeks (around 07/17/2021).   Ortho Exam  Patient is alert, oriented, no adenopathy, well-dressed, normal affect, normal respiratory effort. Examination patient has good pulses she has a pronated valgus foot she has pain with resisted inversion with stressing the posterior tibial tendon.  Patient has pain to palpation along the posterior tibial tendon.  Patient states she was unable to wear the fracture boot since it caused heel pain.  Examination the wrist there is some adipose tissue dorsally the scaphoid scapholunate and TFCC are not tender to palpation.  Imaging: No results found. No images are attached to the encounter.  Labs: Lab Results  Component Value Date   HGBA1C 5.3% 12/19/2012   LABURIC 6.9 05/04/2021     Lab Results  Component Value Date   ALBUMIN 4.0 07/07/2021   ALBUMIN 4.4 08/18/2020   ALBUMIN 4.2 04/15/2020    Lab Results  Component Value Date   MG 2.1 05/23/2013   MG 1.9 05/21/2013   No results found for: VD25OH  No results found for: PREALBUMIN CBC EXTENDED Latest Ref Rng & Units 07/07/2021 08/18/2020 04/15/2020  WBC 3.4 - 10.8 x10E3/uL 11.9(H)  9.5 8.5  RBC 3.77 - 5.28 x10E6/uL 4.86 4.35 4.24  HGB 11.1 - 15.9 g/dL 13.6 12.3 12.3  HCT 34.0 - 46.6 % 41.5 36.7 35.5  PLT 150 - 450 x10E3/uL 377 261 285  NEUTROABS 1.4 - 7.0 x10E3/uL 8.8(H) 5.6 4.4  LYMPHSABS 0.7 - 3.1 x10E3/uL 1.9 2.9 3.2(H)     There is no height or weight on file to calculate BMI.  Orders:  No orders of the defined types were placed in this encounter.  No orders of the defined types were placed in this encounter.    Procedures: No procedures performed  Clinical Data: No additional findings.  ROS:  All other systems negative, except as noted in the HPI. Review of Systems  Objective: Vital Signs: LMP 09/07/2014   Specialty Comments:  No specialty comments available.  PMFS History: Patient Active Problem List   Diagnosis Date Noted   Pain of left heel 04/28/2021   GAD (generalized anxiety disorder) 08/18/2020   Seborrheic dermatitis 12/11/2018   Hyperlipemia 04/16/2016   Essential hypertension 13/03/6577   Metabolic syndrome 46/96/2952   Major depression, recurrent ([place]) 05/24/2013   Prolonged Q-T interval on ECG 05/22/2013   Suicide attempt by other psychotropic drug overdose ([place]) 05/21/2013   Acute renal insufficiency 05/21/2013   Hypokalemia 05/21/2013  Migraine headache 04/20/2013   Obesity, morbid ([place]) 02/02/2013   Past Medical History:  Diagnosis Date   Asthma    Depression    Hyperlipidemia    Hypertension    Mental retardation, mild (I.Q. 50-70)    Migraine    Suicide attempt ([place])     Family History  Problem Relation Age of Onset   Diabetes Mother    Hyperlipidemia Mother    Breast cancer Maternal Aunt     Past Surgical History:  Procedure Laterality Date   ABDOMINAL HYSTERECTOMY     ENDOMETRIAL ABLATION     TONSILLECTOMY     Social History   Occupational History   Occupation: Unemployed   Tobacco Use   Smoking status: Never   Smokeless tobacco: Never  Vaping Use   Vaping Use: Never used  Substance and  Sexual Activity   Alcohol use: No    Alcohol/week: 0.0 standard drinks    Comment: pt reports a history of drinking etoh but denies current use   Drug use: No   Sexual activity: Never

## 2021-08-01 ENCOUNTER — Telehealth: Payer: Self-pay | Admitting: Family

## 2021-08-01 NOTE — Telephone Encounter (Signed)
REFERRAL REQUEST Telephone Note  Have you been seen at our office for this problem? yes (Advise that they may need an appointment with their PCP before a referral can be done)  Reason for Referral: kidney issues Referral discussed with patient: yes  Best contact number of patient for referral team: [phone]    Has patient been seen by a specialist for this issue before: no  Patient provider preference for referral: no Patient location preference for referral: [facility] or [facility], then [facility]   Patient notified that referrals can take up to a week or longer to process. If they haven't heard anything within a week they should call back and speak with the referral department.    Kelli Walls' pt.  She is feeling better & over covid now.

## 2021-08-11 ENCOUNTER — Ambulatory Visit (INDEPENDENT_AMBULATORY_CARE_PROVIDER_SITE_OTHER): Payer: Medicaid Other | Admitting: Family

## 2021-08-11 ENCOUNTER — Encounter: Payer: Self-pay | Admitting: Family

## 2021-08-11 DIAGNOSIS — F331 Major depressive disorder, recurrent, moderate: Secondary | ICD-10-CM

## 2021-08-11 DIAGNOSIS — F411 Generalized anxiety disorder: Secondary | ICD-10-CM | POA: Diagnosis not present

## 2021-08-11 MED ORDER — BUSPIRONE HCL 5 MG PO TABS: 5.0000 mg | ORAL_TABLET | Freq: Three times a day (TID) | ORAL | 1 refills | Status: DC | PRN

## 2021-08-11 MED ORDER — CITALOPRAM HYDROBROMIDE 40 MG PO TABS: 40.0000 mg | ORAL_TABLET | Freq: Every day | ORAL | 5 refills | Status: DC

## 2021-08-11 NOTE — Progress Notes (Signed)
Virtual Visit  Note Due to COVID-19 pandemic this visit was conducted virtually. This visit type was conducted due to national recommendations for restrictions regarding the COVID-19 Pandemic (e.g. social distancing, sheltering in place) in an effort to limit this patient's exposure and mitigate transmission in our community. All issues noted in this document were discussed and addressed.  A physical exam was not performed with this format.  I connected with Kelli Walls on 08/11/21 at 12:33 pm  by telephone and verified that I am speaking with the correct person using two identifiers. Kelli Walls is currently located at car and no one is currently with her during visit. The provider, Evelina Dun, FNP is located in their office at time of visit.  I discussed the limitations, risks, security and privacy concerns of performing an evaluation and management service by telephone and the availability of in person appointments. I also discussed with the patient that there may be a patient responsible charge related to this service. The patient expressed understanding and agreed to proceed.  Kelli Walls, Kelli Walls are scheduled for a virtual visit with your provider today.    Just as we do with appointments in the office, we must obtain your consent to participate.  Your consent will be active for this visit and any virtual visit you may have with one of our providers in the next 365 days.    If you have a MyChart account, I can also send a copy of this consent to you electronically.  All virtual visits are billed to your insurance company just like a traditional visit in the office.  As this is a virtual visit, video technology does not allow for your provider to perform a traditional examination.  This may limit your provider's ability to fully assess your condition.  If your provider identifies any concerns that need to be evaluated in person or the need to arrange testing such as labs, EKG, etc, we will make  arrangements to do so.    Although advances in technology are sophisticated, we cannot ensure that it will always work on either your end or our end.  If the connection with a video visit is poor, we may have to switch to a telephone visit.  With either a video or telephone visit, we are not always able to ensure that we have a secure connection.   I need to obtain your verbal consent now.   Are you willing to proceed with your visit today?   Kelli Walls has provided verbal consent on 08/11/2021 for a virtual visit (video or telephone).   Evelina Dun, [place] 08/11/2021  12:36 PM   History and Present Illness:  Anxiety Presents for follow-up visit. Symptoms include depressed mood, excessive worry, hyperventilation, irritability, nervous/anxious behavior and restlessness. Patient reports no suicidal ideas. Symptoms occur most days. The severity of symptoms is moderate.    Depression        This is a chronic problem.  The current episode started more than 1 year ago.   The problem occurs intermittently.  Associated symptoms include fatigue, helplessness, hopelessness, irritable, restlessness, decreased interest and sad.  Associated symptoms include no appetite change and no suicidal ideas.  Past treatments include SSRIs - Selective serotonin reuptake inhibitors.  Past medical history includes anxiety.      Review of Systems  Constitutional:  Positive for fatigue and irritability. Negative for appetite change.  Psychiatric/Behavioral:  Positive for depression. Negative for suicidal ideas. The patient is nervous/anxious.  Observations/Objective: No SOB or distress noted   Assessment and Plan: 1. Moderate episode of recurrent major depressive disorder (HCC) - citalopram (CELEXA) 40 MG tablet; Take 1 tablet (40 mg total) by mouth daily.  Dispense: 30 tablet; Refill: 5 - busPIRone (BUSPAR) 5 MG tablet; Take 1 tablet (5 mg total) by mouth 3 (three) times daily as needed.  Dispense: 90 tablet;  Refill: 1  2. GAD (generalized anxiety disorder) - citalopram (CELEXA) 40 MG tablet; Take 1 tablet (40 mg total) by mouth daily.  Dispense: 30 tablet; Refill: 5 - busPIRone (BUSPAR) 5 MG tablet; Take 1 tablet (5 mg total) by mouth 3 (three) times daily as needed.  Dispense: 90 tablet; Refill: 1  Will increase Celexa to 40 mg and add Buspar 5 TID prn Stress management  RTO in 1 month   I discussed the assessment and treatment plan with the patient. The patient was provided an opportunity to ask questions and all were answered. The patient agreed with the plan and demonstrated an understanding of the instructions.   The patient was advised to call back or seek an in-person evaluation if the symptoms worsen or if the condition fails to improve as anticipated.  The above assessment and management plan was discussed with the patient. The patient verbalized understanding of and has agreed to the management plan. Patient is aware to call the clinic if symptoms persist or worsen. Patient is aware when to return to the clinic for a follow-up visit. Patient educated on when it is appropriate to go to the emergency department.   Time call ended: 12:44 pm   I provided 11 minutes of  non face-to-face time during this encounter.    Evelina Dun, FNP

## 2021-08-11 NOTE — Patient Instructions (Signed)

## 2021-08-14 ENCOUNTER — Telehealth: Payer: Medicaid Other | Admitting: Family

## 2021-08-21 ENCOUNTER — Other Ambulatory Visit: Payer: Self-pay | Admitting: Family

## 2021-08-21 DIAGNOSIS — I10 Essential (primary) hypertension: Secondary | ICD-10-CM

## 2021-08-28 ENCOUNTER — Ambulatory Visit (INDEPENDENT_AMBULATORY_CARE_PROVIDER_SITE_OTHER): Payer: Medicaid Other

## 2021-08-28 ENCOUNTER — Ambulatory Visit: Payer: Medicaid Other | Admitting: Podiatry

## 2021-08-28 ENCOUNTER — Other Ambulatory Visit: Payer: Self-pay

## 2021-08-28 DIAGNOSIS — M79672 Pain in left foot: Secondary | ICD-10-CM

## 2021-08-28 DIAGNOSIS — M722 Plantar fascial fibromatosis: Secondary | ICD-10-CM

## 2021-08-28 MED ORDER — METHYLPREDNISOLONE 4 MG PO TBPK: ORAL_TABLET | ORAL | 0 refills | Status: DC

## 2021-08-28 MED ORDER — BETAMETHASONE SOD PHOS & ACET 6 (3-3) MG/ML IJ SUSP
3.0000 mg | Freq: Once | INTRAMUSCULAR | Status: AC
  Administered 2021-08-28: 3 mg via INTRA_ARTICULAR

## 2021-08-28 NOTE — Progress Notes (Signed)
° °  Subjective: 51 y.o. female presenting today for evaluation of left foot and ankle pain.  Patient states that she did break her ankle once as a kid.  She has developed significant left heel pain off and on for several years.  She has had injections in the past which have helped significantly.   Past Medical History:  Diagnosis Date   Asthma    Depression    Hyperlipidemia    Hypertension    Mental retardation, mild (I.Q. 50-70)    Migraine    Suicide attempt ([place])      Objective: Physical Exam General: The patient is alert and oriented x3 in no acute distress.  Dermatology: Skin is warm, dry and supple bilateral lower extremities. Negative for open lesions or macerations bilateral.   Vascular: Dorsalis Pedis and Posterior Tibial pulses palpable bilateral.  Capillary fill time is immediate to all digits.  Neurological: Epicritic and protective threshold intact bilateral.   Musculoskeletal: Tenderness to palpation to the plantar aspect of the left heel along the plantar fascia. All other joints range of motion within normal limits bilateral. Strength 5/5 in all groups bilateral.  Pes planus deformity also noted with medial longitudinal arch collapse.  Radiographic exam: Normal osseous mineralization.  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  Joint spaces preserved. No fracture/dislocation/boney destruction. No other soft tissue abnormalities or radiopaque foreign bodies.   Assessment: 1. Plantar fasciitis left foot 2.  Pes planus bilateral  Plan of Care:  1. Patient evaluated. Xrays reviewed.   2. Injection of 0.5cc Celestone soluspan injected into the left plantar fascia.  3. Rx for Medrol Dose Pak placed 4.  No NSAIDs prescribed.  Patient states that she has an appointment with a nephrologist for a growth of the kidney. 5. Plantar fascial band(s) dispensed  6. Instructed patient regarding therapies and modalities at home to alleviate symptoms.  7. Return to  clinic in 4 weeks.     Edrick Kins, DPM [facility]  Dr. Edrick Kins, DPM    [address], [place] [address]                Office [phone]  Fax [phone]

## 2021-09-04 ENCOUNTER — Other Ambulatory Visit: Payer: Self-pay

## 2021-09-04 ENCOUNTER — Other Ambulatory Visit (HOSPITAL_COMMUNITY)
Admission: RE | Admit: 2021-09-04 | Discharge: 2021-09-04 | Disposition: A | Discharge status: Home / Self Care | Payer: Medicaid Other | Source: Ambulatory Visit | Attending: Nephrology | Admitting: Nephrology

## 2021-09-04 DIAGNOSIS — E876 Hypokalemia: Secondary | ICD-10-CM | POA: Diagnosis present

## 2021-09-04 DIAGNOSIS — I129 Hypertensive chronic kidney disease with stage 1 through stage 4 chronic kidney disease, or unspecified chronic kidney disease: Secondary | ICD-10-CM | POA: Insufficient documentation

## 2021-09-04 DIAGNOSIS — E785 Hyperlipidemia, unspecified: Secondary | ICD-10-CM | POA: Insufficient documentation

## 2021-09-04 DIAGNOSIS — N1832 Chronic kidney disease, stage 3b: Secondary | ICD-10-CM | POA: Insufficient documentation

## 2021-09-04 LAB — COMPREHENSIVE METABOLIC PANEL
ALT: 12 U/L (ref 0–44)
AST: 15 U/L (ref 15–41)
Albumin: 4 g/dL (ref 3.5–5.0)
Alkaline Phosphatase: 52 U/L (ref 38–126)
Anion gap: 7 (ref 5–15)
BUN: 21 mg/dL — ABNORMAL HIGH (ref 6–20)
CO2: 29 mmol/L (ref 22–32)
Calcium: 9.2 mg/dL (ref 8.9–10.3)
Chloride: 104 mmol/L (ref 98–111)
Creatinine, Ser: 1.08 mg/dL — ABNORMAL HIGH (ref 0.44–1.00)
GFR, Estimated: >60 mL/min (ref >60)
Glucose, Bld: 96 mg/dL (ref 70–99)
Potassium: 3.1 mmol/L — ABNORMAL LOW (ref 3.5–5.1)
Sodium: 140 mmol/L (ref 135–145)
Total Bilirubin: 0.6 mg/dL (ref 0.3–1.2)
Total Protein: 8 g/dL (ref 6.5–8.1)

## 2021-09-04 LAB — CBC WITH DIFFERENTIAL/PLATELET
Abs Immature Granulocytes: 0.03 10*3/uL (ref 0.00–0.07)
Basophils Absolute: 0.1 10*3/uL (ref 0.0–0.1)
Basophils Relative: 1 %
Eosinophils Absolute: 0.1 10*3/uL (ref 0.0–0.5)
Eosinophils Relative: 1 %
HCT: 40.9 % (ref 36.0–46.0)
Hemoglobin: 13.2 g/dL (ref 12.0–15.0)
Immature Granulocytes: 0 %
Lymphocytes Relative: 25 %
Lymphs Abs: 3 10*3/uL (ref 0.7–4.0)
MCH: 29.1 pg (ref 26.0–34.0)
MCHC: 32.3 g/dL (ref 30.0–36.0)
MCV: 90.1 fL (ref 80.0–100.0)
Monocytes Absolute: 1 10*3/uL (ref 0.1–1.0)
Monocytes Relative: 8 %
Neutro Abs: 7.8 10*3/uL — ABNORMAL HIGH (ref 1.7–7.7)
Neutrophils Relative %: 65 %
Platelets: 276 10*3/uL (ref 150–400)
RBC: 4.54 MIL/uL (ref 3.87–5.11)
RDW: 14.6 % (ref 11.5–15.5)
WBC: 12 10*3/uL — ABNORMAL HIGH (ref 4.0–10.5)
nRBC: 0 % (ref 0.0–0.2)

## 2021-09-04 LAB — RAPID HIV SCREEN (HIV 1/2 AB+AG)
HIV 1/2 Antibodies: NONREACTIVE
HIV-1 P24 Antigen - HIV24: NONREACTIVE

## 2021-09-04 LAB — IRON AND TIBC
Iron: 82 ug/dL (ref 28–170)
Saturation Ratios: 21 % (ref 10.4–31.8)
TIBC: 390 ug/dL (ref 250–450)
UIBC: 308 ug/dL

## 2021-09-04 LAB — HEMOGLOBIN A1C
Hgb A1c MFr Bld: 5.9 % — ABNORMAL HIGH (ref 4.8–5.6)
Mean Plasma Glucose: 122.63 mg/dL

## 2021-09-04 LAB — URIC ACID: Uric Acid, Serum: 8 mg/dL — ABNORMAL HIGH (ref 2.5–7.1)

## 2021-09-04 LAB — PHOSPHORUS: Phosphorus: 2.4 mg/dL — ABNORMAL LOW (ref 2.5–4.6)

## 2021-09-04 LAB — FERRITIN: Ferritin: 49 ng/mL (ref 11–307)

## 2021-09-04 LAB — HEPATITIS B SURFACE ANTIGEN: Hepatitis B Surface Ag: NONREACTIVE

## 2021-09-04 LAB — VITAMIN B12: Vitamin B-12: 204 pg/mL (ref 180–914)

## 2021-09-04 LAB — HEPATITIS C ANTIBODY: HCV Ab: NONREACTIVE

## 2021-09-04 LAB — VITAMIN D 25 HYDROXY (VIT D DEFICIENCY, FRACTURES): Vit D, 25-Hydroxy: 19.1 ng/mL — ABNORMAL LOW (ref 30–100)

## 2021-09-04 LAB — MAGNESIUM: Magnesium: 2 mg/dL (ref 1.7–2.4)

## 2021-09-05 LAB — ANA: Anti Nuclear Antibody (ANA): NEGATIVE

## 2021-09-05 LAB — KAPPA/LAMBDA LIGHT CHAINS
Kappa free light chain: 32.5 mg/L — ABNORMAL HIGH (ref 3.3–19.4)
Kappa, lambda light chain ratio: 2.18 — ABNORMAL HIGH (ref 0.26–1.65)
Lambda free light chains: 14.9 mg/L (ref 5.7–26.3)

## 2021-09-05 LAB — C3 COMPLEMENT: C3 Complement: 171 mg/dL — ABNORMAL HIGH (ref 82–167)

## 2021-09-05 LAB — HEPATITIS B SURFACE ANTIBODY, QUANTITATIVE: Hep B S AB Quant (Post): <3.1 m[IU]/mL — ABNORMAL LOW (ref >9.9)

## 2021-09-05 LAB — PARATHYROID HORMONE, INTACT (NO CA): PTH: 30 pg/mL (ref 15–65)

## 2021-09-05 LAB — C4 COMPLEMENT: Complement C4, Body Fluid: 31 mg/dL (ref 12–38)

## 2021-09-05 LAB — GLOMERULAR BASEMENT MEMBRANE ANTIBODIES: GBM Ab: <0.2 units (ref 0.0–0.9)

## 2021-09-06 LAB — PROTEIN ELECTROPHORESIS, SERUM
A/G Ratio: 1 (ref 0.7–1.7)
Albumin ELP: 3.5 g/dL (ref 2.9–4.4)
Alpha-1-Globulin: 0.2 g/dL (ref 0.0–0.4)
Alpha-2-Globulin: 0.8 g/dL (ref 0.4–1.0)
Beta Globulin: 1 g/dL (ref 0.7–1.3)
Gamma Globulin: 1.5 g/dL (ref 0.4–1.8)
Globulin, Total: 3.5 g/dL (ref 2.2–3.9)
Total Protein ELP: 7 g/dL (ref 6.0–8.5)

## 2021-09-06 LAB — MISC LABCORP TEST (SEND OUT): Labcorp test code: 141330

## 2021-09-06 LAB — ANCA TITERS
Atypical P-ANCA titer: <1:20 {titer}
C-ANCA: <1:20 {titer}
P-ANCA: <1:20 {titer}

## 2021-09-08 LAB — ALDOSTERONE + RENIN ACTIVITY W/ RATIO
ALDO / PRA Ratio: 9.4 (ref 0.0–30.0)
Aldosterone: 4.6 ng/dL (ref 0.0–30.0)
PRA LC/MS/MS: 0.489 ng/mL/hr (ref 0.167–5.380)

## 2021-09-08 LAB — HEPATITIS C GENOTYPE

## 2021-09-11 ENCOUNTER — Ambulatory Visit: Payer: Medicaid Other | Admitting: Family

## 2021-09-11 ENCOUNTER — Encounter: Payer: Self-pay | Admitting: Family

## 2021-09-13 ENCOUNTER — Other Ambulatory Visit (HOSPITAL_COMMUNITY)
Admission: RE | Admit: 2021-09-13 | Discharge: 2021-09-13 | Disposition: A | Discharge status: Home / Self Care | Payer: Medicaid Other | Source: Other Acute Inpatient Hospital | Attending: Nephrology | Admitting: Nephrology

## 2021-09-13 DIAGNOSIS — N1832 Chronic kidney disease, stage 3b: Secondary | ICD-10-CM | POA: Diagnosis present

## 2021-09-13 LAB — NA AND K (SODIUM & POTASSIUM), 24 H UR
Potassium Urine: 15 mmol/L
Potassium, 24H Ur: 24 mmol/d — ABNORMAL LOW (ref 25–125)
Sodium, 24H Ur: 100 mEq/d (ref 40–220)
Sodium, Ur: 62 mmol/L
Urine Total Volume-UNAK24: 1605 mL

## 2021-09-13 LAB — CREATININE CLEARANCE, URINE, 24 HOUR
Collection Interval-CRCL: 24 hours
Creatinine Clearance: 73 mL/min — ABNORMAL LOW (ref 75–115)
Creatinine, 24H Ur: 1132 mg/d (ref 600–1800)
Creatinine, Urine: 70.51 mg/dL
Urine Total Volume-CRCL: 1605 mL

## 2021-09-13 LAB — PROTEIN, URINE, 24 HOUR
Collection Interval-UPROT: 24 hours
Urine Total Volume-UPROT: 1605 mL

## 2021-09-15 LAB — UPEP/UIFE/LIGHT CHAINS/TP, 24-HR UR
% BETA, Urine: 0 %
ALPHA 1 URINE: 0 %
Albumin, U: 0 %
Alpha 2, Urine: 0 %
Free Kappa Lt Chains,Ur: 2.16 mg/L (ref 1.17–86.46)
Free Kappa/Lambda Ratio: >3.22 (ref 1.83–14.26)
Free Lambda Lt Chains,Ur: <0.67 mg/L (ref 0.27–15.21)
GAMMA GLOBULIN URINE: 0 %
Total Protein, Urine-Ur/day: 80 mg/24 hr (ref 30–150)
Total Protein, Urine: 5 mg/dL
Total Volume: 1605

## 2021-09-19 ENCOUNTER — Other Ambulatory Visit: Payer: Self-pay | Admitting: Family

## 2021-09-22 ENCOUNTER — Other Ambulatory Visit: Payer: Self-pay | Admitting: Family

## 2021-09-22 DIAGNOSIS — Z1231 Encounter for screening mammogram for malignant neoplasm of breast: Secondary | ICD-10-CM

## 2021-10-19 ENCOUNTER — Other Ambulatory Visit: Payer: Self-pay | Admitting: Family

## 2021-11-03 ENCOUNTER — Ambulatory Visit: Payer: Medicaid Other | Admitting: Family

## 2021-11-06 ENCOUNTER — Ambulatory Visit
Admission: RE | Admit: 2021-11-06 | Discharge: 2021-11-06 | Disposition: A | Discharge status: Home / Self Care | Payer: Medicaid Other | Source: Ambulatory Visit | Attending: Family | Admitting: Family

## 2021-11-06 DIAGNOSIS — Z1231 Encounter for screening mammogram for malignant neoplasm of breast: Secondary | ICD-10-CM

## 2021-11-07 ENCOUNTER — Encounter: Payer: Self-pay | Admitting: Family

## 2021-11-08 ENCOUNTER — Ambulatory Visit (INDEPENDENT_AMBULATORY_CARE_PROVIDER_SITE_OTHER): Payer: Medicaid Other

## 2021-11-08 ENCOUNTER — Ambulatory Visit: Payer: Medicaid Other | Admitting: Family

## 2021-11-08 ENCOUNTER — Encounter: Payer: Self-pay | Admitting: Family

## 2021-11-08 VITALS — BP 133/88 | HR 78 | Temp 98.0°F | Ht 63.0 in | Wt 240.0 lb

## 2021-11-08 DIAGNOSIS — M25531 Pain in right wrist: Secondary | ICD-10-CM | POA: Diagnosis not present

## 2021-11-08 DIAGNOSIS — M25539 Pain in unspecified wrist: Secondary | ICD-10-CM

## 2021-11-08 DIAGNOSIS — Y92009 Unspecified place in unspecified non-institutional (private) residence as the place of occurrence of the external cause: Secondary | ICD-10-CM | POA: Diagnosis not present

## 2021-11-08 DIAGNOSIS — W19XXXA Unspecified fall, initial encounter: Secondary | ICD-10-CM | POA: Diagnosis not present

## 2021-11-08 MED ORDER — DICLOFENAC SODIUM 75 MG PO TBEC
75.0000 mg | DELAYED_RELEASE_TABLET | Freq: Two times a day (BID) | ORAL | 2 refills | Status: DC
Start: 2021-11-08 — End: 2022-01-16

## 2021-11-08 NOTE — Progress Notes (Signed)
? ?Subjective:  ? ? Patient ID: Kelli Walls, female    DOB: 30-Oct-1970, 51 y.o.   MRN: [id number] ? ?Chief Complaint  ?Patient presents with  ? Wrist Injury  ?  Right   ? ?Pt presents to the office today with right wrist pain that happened after falling a couple of weeks ago.  ?Wrist Injury  ?The incident occurred more than 1 week ago. The injury mechanism was a fall. The pain is present in the right forearm. The quality of the pain is described as aching. The pain is at a severity of 5/10. The pain is mild. The pain has been Intermittent since the incident. Associated symptoms include muscle weakness. Pertinent negatives include no numbness or tingling. The symptoms are aggravated by movement and lifting. She has tried rest and NSAIDs for the symptoms. The treatment provided mild relief.  ? ? ? ?Review of Systems  ?Neurological:  Negative for tingling and numbness.  ?All other systems reviewed and are negative. ? ?   ?Objective:  ? Physical Exam ?Vitals reviewed.  ?Constitutional:   ?   General: She is not in acute distress. ?   Appearance: She is well-developed.  ?HENT:  ?   Head: Normocephalic and atraumatic.  ?   Right Ear: Tympanic membrane normal.  ?   Left Ear: Tympanic membrane normal.  ?Eyes:  ?   Pupils: Pupils are equal, round, and reactive to light.  ?Neck:  ?   Thyroid: No thyromegaly.  ?Cardiovascular:  ?   Rate and Rhythm: Normal rate and regular rhythm.  ?   Heart sounds: Normal heart sounds. No murmur heard. ?Pulmonary:  ?   Effort: Pulmonary effort is normal. No respiratory distress.  ?   Breath sounds: Normal breath sounds. No wheezing.  ?Abdominal:  ?   General: Bowel sounds are normal. There is no distension.  ?   Palpations: Abdomen is soft.  ?   Tenderness: There is no abdominal tenderness.  ?Musculoskeletal:     ?   General: Tenderness (right forearm pain and swelling with flexion) present. Normal range of motion.  ?   Cervical back: Normal range of motion and neck supple.  ?Skin: ?   General:  Skin is warm and dry.  ?Neurological:  ?   Mental Status: She is alert and oriented to person, place, and time.  ?   Cranial Nerves: No cranial nerve deficit.  ?   Deep Tendon Reflexes: Reflexes are normal and symmetric.  ?Psychiatric:     ?   Behavior: Behavior normal.     ?   Thought Content: Thought content normal.     ?   Judgment: Judgment normal.  ? ? ? ? ?BP 133/88   Pulse 78   Temp 98 ?F (36.7 ?C) (Temporal)   Ht '5\' 3"'$  (1.6 m)   Wt 240 lb (108.9 kg)   LMP 09/07/2014   BMI 42.51 kg/m?  ? ?   ?Assessment & Plan:  ?Kelli Walls comes in today with chief complaint of Wrist Injury (Right ) ? ? ?Diagnosis and orders addressed: ? ?1. Pain in wrist, unspecified laterality ?- DG Wrist Complete Right; Future ?- diclofenac (VOLTAREN) 75 MG EC tablet; Take 1 tablet (75 mg total) by mouth 2 (two) times daily.  Dispense: 60 tablet; Refill: 2 ? ?2. Fall in home, initial encounter ? ?- diclofenac (VOLTAREN) 75 MG EC tablet; Take 1 tablet (75 mg total) by mouth 2 (two) times daily.  Dispense: 60 tablet; Refill: 2 ? ? ?  Start diclofeanac BID with food ?No other NSAID's  ?If pain continues will need referral to [facility]  ? ? ?Evelina Dun, FNP ? ? ? ?

## 2021-11-08 NOTE — Patient Instructions (Signed)
Wrist Pain, Adult ?There are many things that can cause wrist pain. Some common causes include: ?An injury to the wrist area, such as a sprain, strain, or fracture. ?Overuse of the joint. ?A condition that causes increased pressure on a nerve in the wrist (carpal tunnel syndrome). ?Wear and tear of the joints that occurs with aging (osteoarthritis). ?Other types of joint inflammation and stiffness (arthritis). ?Sometimes, the cause of wrist pain is not known. Often, the pain goes away when you follow instructions from your health care provider for relieving pain at home, such as resting the wrist, icing the wrist, or using a splint or an elastic wrap for a short time. If your wrist pain continues, it is important to tell your health care provider. ?Follow these instructions at home: ?If you have a splint or elastic wrap: ?Wear the splint or wrap as told by your health care provider. Remove it only as told by your health care provider. Ask your health care provider if you may remove it for bathing. ?Loosen the splint or wrap if your fingers tingle, become numb, or turn cold and blue. ?Check the skin around the splint or wrap every day. Tell your health care provider about any concerns. ?Keep the splint or wrap clean. ?If the splint or wrap is not waterproof: ?Do not let it get wet. ?Cover it with a watertight covering when you take a bath or shower. ?Managing pain, stiffness, and swelling ? ?If directed, put ice on the painful area. To do this: ?If you have a removable splint or wrap, remove it as told by your health care provider. ?Put ice in a plastic bag. ?Place a towel between your skin and the bag or between your splint or wrap and the bag. ?Leave the ice on for 20 minutes, 2-3 times a day. ?Move your fingers often to reduce stiffness and swelling. ?Raise (elevate) the injured area above the level of your heart while you are sitting or lying down. ?Activity ?Rest your affected wrist as told by your health care  provider. ?Return to your normal activities as told by your health care provider. Ask your health care provider what activities are safe for you. ?Ask your health care provider when it is safe to drive if you have a splint or wrap on your wrist. ?Do exercises as told by your health care provider. ?General instructions ?Pay attention to any changes in your symptoms. ?Take over-the-counter and prescription medicines only as told by your health care provider. ?Keep all follow-up visits as told by your health care provider. This is important. ?Contact a health care provider if: ?You have a sudden, sharp pain in the wrist, hand, or arm that is different or new. ?The swelling or bruising on your wrist or hand gets worse. ?Your skin becomes red, gets a rash, or has open sores. ?Your pain does not get better or it gets worse. ?You have a fever or chills. ?Get help right away if: ?You lose feeling in your fingers or hand. ?Your fingers turn white, very red, or cold and blue. ?You cannot move your fingers. ?Summary ?Wrist pain in an adult has many different causes. ?If your wrist pain continues, it is important to tell your health care provider. ?You may need to wear a splint or an elastic wrap for a short period of time. ?Return to your normal activities as told by your health care provider. Ask your health care provider what activities are safe for you. ?This information is not intended  to replace advice given to you by your health care provider. Make sure you discuss any questions you have with your health care provider. ?Document Revised: 06/11/2019 Document Reviewed: 06/11/2019 ?Elsevier Patient Education ? [place]. ? ?

## 2021-11-14 ENCOUNTER — Telehealth: Payer: Self-pay | Admitting: Family

## 2021-11-14 ENCOUNTER — Other Ambulatory Visit: Payer: Self-pay | Admitting: Family

## 2021-11-14 DIAGNOSIS — F331 Major depressive disorder, recurrent, moderate: Secondary | ICD-10-CM

## 2021-11-14 DIAGNOSIS — F411 Generalized anxiety disorder: Secondary | ICD-10-CM

## 2021-11-14 NOTE — Telephone Encounter (Signed)
PA for Diclofenac ?Confirmation #:[id number] WPrior Approval [id number] Status:APPROVED ? ? ?Pharmacy aware. ?

## 2021-11-20 NOTE — Telephone Encounter (Signed)
Know, but you are also not immune to it.  I recommend vaccination ?

## 2021-11-27 ENCOUNTER — Ambulatory Visit (INDEPENDENT_AMBULATORY_CARE_PROVIDER_SITE_OTHER): Payer: Medicaid Other | Admitting: Podiatry

## 2021-11-27 DIAGNOSIS — M722 Plantar fascial fibromatosis: Secondary | ICD-10-CM | POA: Diagnosis not present

## 2021-11-27 MED ORDER — METHYLPREDNISOLONE 4 MG PO TBPK: ORAL_TABLET | ORAL | 0 refills | Status: DC

## 2021-11-27 MED ORDER — BETAMETHASONE SOD PHOS & ACET 6 (3-3) MG/ML IJ SUSP
3.0000 mg | Freq: Once | INTRAMUSCULAR | Status: AC
  Administered 2021-11-27: 3 mg via INTRA_ARTICULAR

## 2021-11-27 NOTE — Progress Notes (Signed)
? ?  Subjective: ?51 y.o. female presenting today for follow-up evaluation of plantar fasciitis to the left foot.  Patient states that the injection helped significantly for few months however she has had an acute flareup over the past week.  Unfortunately she did not take the prednisone pack last visit.  It was apparently sent to the wrong pharmacy. ? ?Past Medical History:  ?Diagnosis Date  ? Asthma   ? Depression   ? Hyperlipidemia   ? Hypertension   ? Mental retardation, mild (I.Q. 50-70)   ? Migraine   ? Suicide attempt [facility])   ? ? ? ?Objective: ?Physical Exam ?General: The patient is alert and oriented x3 in no acute distress. ? ?Dermatology: Skin is warm, dry and supple bilateral lower extremities. Negative for open lesions or macerations bilateral.  ? ?Vascular: Dorsalis Pedis and Posterior Tibial pulses palpable bilateral.  Capillary fill time is immediate to all digits. ? ?Neurological: Epicritic and protective threshold intact bilateral.  ? ?Musculoskeletal: There continues to be some tenderness to palpation to the plantar aspect of the left heel along the plantar fascia. All other joints range of motion within normal limits bilateral. Strength 5/5 in all groups bilateral.  ?Pes planus deformity also noted with medial longitudinal arch collapse. ? ? ?Assessment: ?1. Plantar fasciitis left foot ?2.  Pes planus bilateral ? ?Plan of Care:  ?1. Patient evaluated. Xrays reviewed.   ?2. Injection of 0.5cc Celestone soluspan injected into the left plantar fascia.  ?3.  Prescription for Medrol Dosepak sent to the correct pharmacy today.  No NSAIDs prescribed.   ?5.  Continue plantar fascial brace ?6.  Continue wearing good supportive shoes and sneakers ?7.  Return to clinic as needed ? ? ?Edrick Kins, DPM ?[place] ? ?Dr. Edrick Kins, DPM  ?  ?[address]                                     ?[place], [place] [address]                ?Office [phone]  ?Fax [phone] ? ? ? ? ?

## 2021-12-01 ENCOUNTER — Telehealth: Payer: Self-pay | Admitting: Family

## 2021-12-01 NOTE — Telephone Encounter (Signed)
Pharmacy called wanting to clarify medication for patient. ? ?Says they noticed that pts PCP sent in Rx for pt to take Metoprolol but says they also received Carvedilol Rx from Dr Lesia Hausen office for pt last month. ? ?Needs to clarify if PCP knows about this and which medicine is pt to be taking? ?

## 2021-12-01 NOTE — Telephone Encounter (Signed)
Per Nephrologists he stopped metoprolol ans started her on Coreg. He also stopped her HCTZ and started her on chlorthalidone 25 mg.  ?

## 2021-12-04 NOTE — Telephone Encounter (Signed)
[facility] aware of the clarification on the changes to pt's medications since she has her meds bubble packed ?

## 2022-01-16 ENCOUNTER — Other Ambulatory Visit: Payer: Self-pay | Admitting: Family

## 2022-01-16 DIAGNOSIS — W19XXXA Unspecified fall, initial encounter: Secondary | ICD-10-CM

## 2022-01-16 DIAGNOSIS — M25539 Pain in unspecified wrist: Secondary | ICD-10-CM

## 2022-01-16 DIAGNOSIS — F331 Major depressive disorder, recurrent, moderate: Secondary | ICD-10-CM

## 2022-01-16 DIAGNOSIS — F411 Generalized anxiety disorder: Secondary | ICD-10-CM

## 2022-02-14 ENCOUNTER — Other Ambulatory Visit: Payer: Self-pay | Admitting: Family

## 2022-02-14 DIAGNOSIS — M25539 Pain in unspecified wrist: Secondary | ICD-10-CM

## 2022-02-14 DIAGNOSIS — F411 Generalized anxiety disorder: Secondary | ICD-10-CM

## 2022-02-14 DIAGNOSIS — F331 Major depressive disorder, recurrent, moderate: Secondary | ICD-10-CM

## 2022-02-14 DIAGNOSIS — Y92009 Unspecified place in unspecified non-institutional (private) residence as the place of occurrence of the external cause: Secondary | ICD-10-CM

## 2022-03-12 ENCOUNTER — Encounter: Payer: Self-pay | Admitting: Nurse Practitioner

## 2022-03-12 ENCOUNTER — Ambulatory Visit (INDEPENDENT_AMBULATORY_CARE_PROVIDER_SITE_OTHER): Payer: Medicaid Other

## 2022-03-12 ENCOUNTER — Ambulatory Visit (INDEPENDENT_AMBULATORY_CARE_PROVIDER_SITE_OTHER): Payer: Medicaid Other | Admitting: Nurse Practitioner

## 2022-03-12 VITALS — BP 131/84 | HR 77 | Temp 97.8°F | Ht 63.0 in | Wt 229.0 lb

## 2022-03-12 DIAGNOSIS — M25531 Pain in right wrist: Secondary | ICD-10-CM | POA: Diagnosis not present

## 2022-03-12 NOTE — Patient Instructions (Signed)
Wrist Pain, Adult There are many things that can cause wrist pain. Some common causes include: An injury to the wrist area, such as a sprain, strain, or fracture. Overuse of the joint. A condition that causes increased pressure on a nerve in the wrist (carpal tunnel syndrome). Wear and tear of the joints that occurs with aging (osteoarthritis). Other types of joint inflammation and stiffness (arthritis). Sometimes, the cause of wrist pain is not known. Often, the pain goes away when you follow instructions from your health care provider for relieving pain at home, such as resting the wrist, icing the wrist, or using a splint or an elastic wrap for a short time. If your wrist pain continues, it is important to tell your health care provider. Follow these instructions at home: If you have a splint or elastic wrap: Wear the splint or wrap as told by your health care provider. Remove it only as told by your health care provider. Ask your health care provider if you may remove it for bathing. Loosen the splint or wrap if your fingers tingle, become numb, or turn cold and blue. Check the skin around the splint or wrap every day. Tell your health care provider about any concerns. Keep the splint or wrap clean. If the splint or wrap is not waterproof: Do not let it get wet. Cover it with a watertight covering when you take a bath or shower. Managing pain, stiffness, and swelling  If directed, put ice on the painful area. To do this: If you have a removable splint or wrap, remove it as told by your health care provider. Put ice in a plastic bag. Place a towel between your skin and the bag or between your splint or wrap and the bag. Leave the ice on for 20 minutes, 2-3 times a day. Move your fingers often to reduce stiffness and swelling. Raise (elevate) the injured area above the level of your heart while you are sitting or lying down. Activity Rest your affected wrist as told by your health care  provider. Return to your normal activities as told by your health care provider. Ask your health care provider what activities are safe for you. Ask your health care provider when it is safe to drive if you have a splint or wrap on your wrist. Do exercises as told by your health care provider. General instructions Pay attention to any changes in your symptoms. Take over-the-counter and prescription medicines only as told by your health care provider. Keep all follow-up visits as told by your health care provider. This is important. Contact a health care provider if: You have a sudden, sharp pain in the wrist, hand, or arm that is different or new. The swelling or bruising on your wrist or hand gets worse. Your skin becomes red, gets a rash, or has open sores. Your pain does not get better or it gets worse. You have a fever or chills. Get help right away if: You lose feeling in your fingers or hand. Your fingers turn white, very red, or cold and blue. You cannot move your fingers. Summary Wrist pain in an adult has many different causes. If your wrist pain continues, it is important to tell your health care provider. You may need to wear a splint or an elastic wrap for a short period of time. Return to your normal activities as told by your health care provider. Ask your health care provider what activities are safe for you. This information is not intended   to replace advice given to you by your health care provider. Make sure you discuss any questions you have with your health care provider. Document Revised: 06/11/2019 Document Reviewed: 06/11/2019 Elsevier Patient Education  [place].

## 2022-03-12 NOTE — Progress Notes (Signed)
Acute Office Visit  Subjective:     Patient ID: Kelli Walls, female    DOB: Feb 06, 1971, 51 y.o.   MRN: [id number]  Chief Complaint  Patient presents with   Fall    Pt states she has had several falls the last week     Fall   Patient is in today for  Fatigue  She reports new onset fatigue which she describes as a lack of energy and feeling exhausted. It began a few weeks ago and occurs a few days a week. It is described as moderate and gradually worsening. She has not started new medications around the time the fatigue started.   Associated symptoms: No arthralgias No bleeding  No melena No chest discomfort  No heart palpitations No heart racing   No dyspnea No feeling depressed  No feeling anxious or under stress No fevers  No loss of appetite No nausea  No vomiting No sleeping problems    Wt Readings from Last 3 Encounters:  03/12/22 229 lb (103.9 kg)  11/08/21 240 lb (108.9 kg)  07/07/21 236 lb 3.2 oz (107.1 kg)    Lab Results  Component Value Date   WBC 12.0 (H) 09/04/2021   HGB 13.2 09/04/2021   HCT 40.9 09/04/2021   MCV 90.1 09/04/2021   PLT 276 09/04/2021  No results found for: "TSH" Lab Results  Component Value Date   NA 140 09/04/2021   K 3.1 (L) 09/04/2021   CO2 29 09/04/2021   BUN 21 (H) 09/04/2021   CREATININE 1.08 (H) 09/04/2021   CALCIUM 9.2 09/04/2021   GLUCOSE 96 09/04/2021     ---------------------------------------------------------------------------------------------------   Review of Systems  Constitutional: Negative.   HENT: Negative.    Eyes: Negative.   Respiratory: Negative.    Cardiovascular: Negative.   Genitourinary: Negative.   Musculoskeletal:  Positive for joint pain.  Skin: Negative.  Negative for itching and rash.  All other systems reviewed and are negative.       Objective:    BP 131/84   Pulse 77   Temp 97.8 F (36.6 C)   Ht '5\' 3"'$  (1.6 m)   Wt 229 lb (103.9 kg)   LMP 09/07/2014   SpO2 98%   BMI 40.57  kg/m  BP Readings from Last 3 Encounters:  03/12/22 131/84  11/08/21 133/88  07/07/21 139/89   Wt Readings from Last 3 Encounters:  03/12/22 229 lb (103.9 kg)  11/08/21 240 lb (108.9 kg)  07/07/21 236 lb 3.2 oz (107.1 kg)      Physical Exam Vitals and nursing note reviewed.  Constitutional:      Appearance: She is obese.  HENT:     Head: Normocephalic.     Right Ear: External ear normal.     Left Ear: External ear normal.     Nose: Nose normal.  Cardiovascular:     Rate and Rhythm: Normal rate and regular rhythm.     Pulses: Normal pulses.     Heart sounds: Normal heart sounds.  Pulmonary:     Effort: Pulmonary effort is normal.     Breath sounds: Normal breath sounds.  Abdominal:     General: Bowel sounds are normal.  Musculoskeletal:     Right upper arm: Swelling and tenderness present.  Skin:    General: Skin is warm.     Findings: No erythema or rash.  Neurological:     Mental Status: She is alert and oriented to person, place, and time.  Psychiatric:  Behavior: Behavior normal.     No results found for any visits on 03/12/22.      Assessment & Plan:  Patient is reporting recurrent falls from weakness.  Assess patient's hydration, blood pressure medication.  Advised patient to check blood pressure and make sure that she is not hypotensive.  Increase hydration.  Completed right wrist x-ray for swollen right wrist and pain, results Pending.  Depo-Medrol shot given in clinic to help with pain and inflammation Continue Tylenol as needed for pain  Follow-up with worsening unresolved symptoms. Problem List Items Addressed This Visit   None Visit Diagnoses     Acute pain of right wrist    -  Primary   Relevant Orders   DG Wrist Complete Right       No orders of the defined types were placed in this encounter.   Return if symptoms worsen or fail to improve.  Ivy Lynn, NP

## 2022-03-19 ENCOUNTER — Telehealth: Payer: Self-pay | Admitting: Family

## 2022-03-19 ENCOUNTER — Other Ambulatory Visit: Payer: Self-pay | Admitting: Family Medicine

## 2022-03-19 DIAGNOSIS — W19XXXA Unspecified fall, initial encounter: Secondary | ICD-10-CM

## 2022-03-19 DIAGNOSIS — M25539 Pain in unspecified wrist: Secondary | ICD-10-CM

## 2022-03-19 DIAGNOSIS — F411 Generalized anxiety disorder: Secondary | ICD-10-CM

## 2022-03-19 DIAGNOSIS — F331 Major depressive disorder, recurrent, moderate: Secondary | ICD-10-CM

## 2022-03-19 NOTE — Telephone Encounter (Signed)
Why is she seeing nephrologists? Kidney function is stable.

## 2022-03-20 NOTE — Telephone Encounter (Signed)
Last referral says stage 3 chronic Kidney disease

## 2022-03-20 NOTE — Telephone Encounter (Signed)
Last Kidney check her levels are good. We will hold off on referral at this time and if kidneys change will do referral.

## 2022-03-20 NOTE — Telephone Encounter (Signed)
Patient aware and verbalized understanding. °

## 2022-03-26 ENCOUNTER — Ambulatory Visit: Payer: Medicaid Other | Admitting: Podiatry

## 2022-03-28 ENCOUNTER — Encounter (HOSPITAL_COMMUNITY): Payer: Self-pay | Admitting: Emergency Medicine

## 2022-03-28 ENCOUNTER — Emergency Department (HOSPITAL_COMMUNITY)
Admission: EM | Admit: 2022-03-28 | Discharge: 2022-03-29 | Discharge status: Against Medical Advice | Payer: Medicaid Other | Attending: Emergency Medicine | Admitting: Emergency Medicine

## 2022-03-28 ENCOUNTER — Ambulatory Visit (INDEPENDENT_AMBULATORY_CARE_PROVIDER_SITE_OTHER): Payer: Medicaid Other | Admitting: Podiatry

## 2022-03-28 DIAGNOSIS — Z5321 Procedure and treatment not carried out due to patient leaving prior to being seen by health care provider: Secondary | ICD-10-CM | POA: Diagnosis not present

## 2022-03-28 DIAGNOSIS — R102 Pelvic and perineal pain: Secondary | ICD-10-CM | POA: Diagnosis present

## 2022-03-28 DIAGNOSIS — Z9181 History of falling: Secondary | ICD-10-CM | POA: Diagnosis not present

## 2022-03-28 LAB — URINALYSIS, ROUTINE W REFLEX MICROSCOPIC
Bilirubin Urine: NEGATIVE
Glucose, UA: NEGATIVE mg/dL
Ketones, ur: NEGATIVE mg/dL
Nitrite: NEGATIVE
Protein, ur: 100 mg/dL — AB
RBC / HPF: >50 RBC/hpf — ABNORMAL HIGH (ref 0–5)
Specific Gravity, Urine: 1.024 (ref 1.005–1.030)
Trans Epithel, UA: 1
WBC, UA: >50 WBC/hpf — ABNORMAL HIGH (ref 0–5)
pH: 5 (ref 5.0–8.0)

## 2022-03-28 NOTE — ED Triage Notes (Signed)
Pt c/o intermittent vaginal pain x 2-3 days worse when sitting. Pt states pain initially started after intercourse. Also states "it smells different down there and feels like the walls are fixing to fall down in my vagina."

## 2022-03-28 NOTE — Progress Notes (Signed)
   Chief Complaint  Patient presents with   Foot Swelling    Rm 8 Right ankle edema. Pt states she fell 4 times in the past month. Pt states her knees are giving out and losing balance.     HPI: 51 y.o. female presenting today due to concern regarding multiple falls over the past month.  Patient is not sure why she is losing her balance.  She is concerned that possibly it is either her knee or her ankle.  She does state that prior to her falls she does experience syncopal type episodes and she "blacks out".  She presents for further treatment and evaluation  Past Medical History:  Diagnosis Date   Asthma    Depression    Hyperlipidemia    Hypertension    Mental retardation, mild (I.Q. 50-70)    Migraine    Suicide attempt ([place])     Past Surgical History:  Procedure Laterality Date   ABDOMINAL HYSTERECTOMY     ENDOMETRIAL ABLATION     TONSILLECTOMY      Allergies  Allergen Reactions   Penicillins Itching     Focused physical Exam: General: The patient is alert and oriented x3 in no acute distress.  Dermatology: Skin is warm, dry and supple bilateral lower extremities. Negative for open lesions or macerations.  Vascular: Palpable pedal pulses bilaterally. Capillary refill within normal limits.  Negative for any significant edema or erythema  Neurological: Light touch and protective threshold grossly intact  Musculoskeletal Exam: No pedal deformities noted.  There is no pain on palpation throughout the foot or ankle.  Range of motion within normal limits and muscle strength 5/5 all compartments.  Assessment: 1.  History of multiple falls   Plan of Care:  1. Patient evaluated.  2.  I explained to the patient that I do not believe her falls are not associated with ankle or foot instability. 3.  Concern for syncopal episodes prior to falls.  Recommend follow-up with PCP.  Patient states that she is calling today to make an appointment For.  Return to clinic as needed      Edrick Kins, DPM [facility]  Dr. Edrick Kins, DPM    [address], [place] [address]                Office [phone]  Fax [phone]

## 2022-03-29 ENCOUNTER — Ambulatory Visit: Payer: Medicaid Other | Admitting: Family Medicine

## 2022-03-29 ENCOUNTER — Encounter: Payer: Self-pay | Admitting: Family Medicine

## 2022-03-29 VITALS — HR 85 | Temp 97.7°F | Ht 63.0 in | Wt 224.5 lb

## 2022-03-29 DIAGNOSIS — N760 Acute vaginitis: Secondary | ICD-10-CM | POA: Diagnosis not present

## 2022-03-29 DIAGNOSIS — B9689 Other specified bacterial agents as the cause of diseases classified elsewhere: Secondary | ICD-10-CM | POA: Diagnosis not present

## 2022-03-29 DIAGNOSIS — N3001 Acute cystitis with hematuria: Secondary | ICD-10-CM | POA: Diagnosis not present

## 2022-03-29 LAB — WET PREP FOR TRICH, YEAST, CLUE
Clue Cell Exam: POSITIVE — AB
Trichomonas Exam: NEGATIVE
Yeast Exam: NEGATIVE

## 2022-03-29 MED ORDER — CEPHALEXIN 500 MG PO CAPS: 500.0000 mg | ORAL_CAPSULE | Freq: Two times a day (BID) | ORAL | 0 refills | Status: DC

## 2022-03-29 MED ORDER — METRONIDAZOLE 500 MG PO TABS: 500.0000 mg | ORAL_TABLET | Freq: Two times a day (BID) | ORAL | 0 refills | Status: AC

## 2022-03-29 NOTE — Patient Instructions (Signed)
Urinary Tract Infection, Adult A urinary tract infection (UTI) is an infection of any part of the urinary tract. The urinary tract includes: The kidneys. The ureters. The bladder. The urethra. These organs make, store, and get rid of pee (urine) in the body. What are the causes? This infection is caused by germs (bacteria) in your genital area. These germs grow and cause swelling (inflammation) of your urinary tract. What increases the risk? The following factors may make you more likely to develop this condition: Using a small, thin tube (catheter) to drain pee. Not being able to control when you pee or poop (incontinence). Being female. If you are female, these things can increase the risk: Using these methods to prevent pregnancy: A medicine that kills sperm (spermicide). A device that blocks sperm (diaphragm). Having low levels of a female hormone (estrogen). Being pregnant. You are more likely to develop this condition if: You have genes that add to your risk. You are sexually active. You take antibiotic medicines. You have trouble peeing because of: A prostate that is bigger than normal, if you are female. A blockage in the part of your body that drains pee from the bladder. A kidney stone. A nerve condition that affects your bladder. Not getting enough to drink. Not peeing often enough. You have other conditions, such as: Diabetes. A weak disease-fighting system (immune system). Sickle cell disease. Gout. Injury of the spine. What are the signs or symptoms? Symptoms of this condition include: Needing to pee right away. Peeing small amounts often. Pain or burning when peeing. Blood in the pee. Pee that smells bad or not like normal. Trouble peeing. Pee that is cloudy. Fluid coming from the vagina, if you are female. Pain in the belly or lower back. Other symptoms include: Vomiting. Not feeling hungry. Feeling mixed up (confused). This may be the first symptom in  older adults. Being tired and grouchy (irritable). A fever. Watery poop (diarrhea). How is this treated? Taking antibiotic medicine. Taking other medicines. Drinking enough water. In some cases, you may need to see a specialist. Follow these instructions at home:  Medicines Take over-the-counter and prescription medicines only as told by your doctor. If you were prescribed an antibiotic medicine, take it as told by your doctor. Do not stop taking it even if you start to feel better. General instructions Make sure you: Pee until your bladder is empty. Do not hold pee for a long time. Empty your bladder after sex. Wipe from front to back after peeing or pooping if you are a female. Use each tissue one time when you wipe. Drink enough fluid to keep your pee pale yellow. Keep all follow-up visits. Contact a doctor if: You do not get better after 1-2 days. Your symptoms go away and then come back. Get help right away if: You have very bad back pain. You have very bad pain in your lower belly. You have a fever. You have chills. You feeling like you will vomit or you vomit. Summary A urinary tract infection (UTI) is an infection of any part of the urinary tract. This condition is caused by germs in your genital area. There are many risk factors for a UTI. Treatment includes antibiotic medicines. Drink enough fluid to keep your pee pale yellow. This information is not intended to replace advice given to you by your health care provider. Make sure you discuss any questions you have with your health care provider. Document Revised: 03/04/2020 Document Reviewed: 03/04/2020 Elsevier Patient Education    2023 Elsevier Inc.  

## 2022-03-29 NOTE — Progress Notes (Signed)
Acute Office Visit  Subjective:     Patient ID: Kelli Walls, female    DOB: March 27, 1971, 51 y.o.   MRN: [id number]  Chief Complaint  Patient presents with   Vaginitis    Vaginal Pain The patient's primary symptoms include a genital odor. The patient's pertinent negatives include no genital itching, genital lesions, genital rash, vaginal bleeding or vaginal discharge. This is a new problem. The current episode started in the past 7 days. Episode frequency: after sex. The problem has been gradually improving. Associated symptoms include dysuria, frequency and painful intercourse. Pertinent negatives include no abdominal pain, anorexia, back pain, chills, constipation, diarrhea, discolored urine, fever, flank pain, hematuria, nausea or urgency.   She went to the ER yesterday but left prior to being seen. They did run a UA on her urine however. She has an appt with her OB/Gyn tomorrow. She is sexually active in a monogamous relationship. She denies concerns for STIs.     Review of Systems  Constitutional:  Negative for chills and fever.  Gastrointestinal:  Negative for abdominal pain, anorexia, constipation, diarrhea and nausea.  Genitourinary:  Positive for dysuria, frequency and vaginal pain. Negative for flank pain, hematuria, urgency and vaginal discharge.  Musculoskeletal:  Negative for back pain.        Objective:    Pulse 85   Temp 97.7 F (36.5 C) (Temporal)   Ht '5\' 3"'$  (1.6 m)   Wt 224 lb 8 oz (101.8 kg)   LMP 09/07/2014   SpO2 99%   BMI 39.77 kg/m    Physical Exam Vitals and nursing note reviewed.  Constitutional:      General: She is not in acute distress.    Appearance: She is not ill-appearing, toxic-appearing or diaphoretic.  Cardiovascular:     Rate and Rhythm: Normal rate and regular rhythm.     Heart sounds: Normal heart sounds. No murmur heard. Pulmonary:     Effort: Pulmonary effort is normal. No respiratory distress.     Breath sounds: Normal breath  sounds.  Abdominal:     General: Bowel sounds are normal. There is no distension.     Palpations: Abdomen is soft.     Tenderness: There is no abdominal tenderness. There is no right CVA tenderness, left CVA tenderness, guarding or rebound.  Skin:    General: Skin is warm and dry.  Neurological:     General: No focal deficit present.     Mental Status: She is alert and oriented to person, place, and time.  Psychiatric:        Mood and Affect: Mood normal.        Behavior: Behavior normal.     No results found for any visits on 03/29/22.      Assessment & Plan:   Kelli Walls was seen today for vaginitis.  Diagnoses and all orders for this visit:  Acute cystitis with hematuria Bacterial vaginosis UA in ER yesterday was + for many bacteria, RBCs >50, WBCs >5. Will sent urine for culture today. Will treat with keflex pending culture results. Wet prep for vaginitis today + for clue cells. Will treat with flagyl for BV. Return to office for new or worsening symptoms, or if symptoms persist.  -     Urine Culture -     WET PREP FOR TRICH, YEAST, CLUE -     metroNIDAZOLE (FLAGYL) 500 MG tablet; Take 1 tablet (500 mg total) by mouth 2 (two) times daily for 7 days. -  cephALEXin (KEFLEX) 500 MG capsule; Take 1 capsule (500 mg total) by mouth 2 (two) times daily.  The patient indicates understanding of these issues and agrees with the plan.  Return for needs chronic follow up with PCP.  Gwenlyn Perking, FNP

## 2022-03-29 NOTE — ED Notes (Signed)
Called for treatment room no answer.

## 2022-03-31 LAB — URINE CULTURE

## 2022-04-02 ENCOUNTER — Ambulatory Visit: Payer: Medicaid Other | Admitting: Family

## 2022-04-20 ENCOUNTER — Encounter: Payer: Self-pay | Admitting: Family

## 2022-04-20 ENCOUNTER — Ambulatory Visit (INDEPENDENT_AMBULATORY_CARE_PROVIDER_SITE_OTHER): Payer: Medicaid Other

## 2022-04-20 ENCOUNTER — Ambulatory Visit: Payer: Medicaid Other | Admitting: Family

## 2022-04-20 VITALS — BP 124/86 | HR 83 | Temp 97.3°F | Ht 63.0 in | Wt 228.4 lb

## 2022-04-20 DIAGNOSIS — W19XXXA Unspecified fall, initial encounter: Secondary | ICD-10-CM

## 2022-04-20 DIAGNOSIS — S60211A Contusion of right wrist, initial encounter: Secondary | ICD-10-CM

## 2022-04-20 DIAGNOSIS — S6991XA Unspecified injury of right wrist, hand and finger(s), initial encounter: Secondary | ICD-10-CM

## 2022-04-20 MED ORDER — DICLOFENAC SODIUM 75 MG PO TBEC: 75.0000 mg | DELAYED_RELEASE_TABLET | Freq: Two times a day (BID) | ORAL | 0 refills | Status: DC

## 2022-04-20 NOTE — Progress Notes (Addendum)
Subjective:    Patient ID: Kelli Walls, female    DOB: 11/19/1970, 51 y.o.   MRN: [id number]  Chief Complaint  Patient presents with   Kelli Walls on right side hurt arm leg and head    PT presents to the office as a walk in after a fall outside of our building. States she was walking in with her aunt who lost her balance and she fell with her aunt.   She reports she tried catching her aunt and landed on her right wrist.  Denies any head injury or bleeding.  Fall The accident occurred Less than 1 hour ago. She fell from a height of 3 to 5 ft. She landed on Concrete. There was no blood loss. The point of impact was the right wrist and right shoulder. The pain is present in the right wrist. The pain is at a severity of 5/10. The pain is moderate. The symptoms are aggravated by pressure on injury. Pertinent negatives include no bowel incontinence, fever, hearing loss, hematuria or loss of consciousness. She has tried nothing for the symptoms. The treatment provided no relief.      Review of Systems  Constitutional:  Negative for fever.  Gastrointestinal:  Negative for bowel incontinence.  Genitourinary:  Negative for hematuria.  Neurological:  Negative for loss of consciousness.  All other systems reviewed and are negative.  Family History  Problem Relation Age of Onset   Diabetes Mother    Hyperlipidemia Mother    Breast cancer Maternal Aunt    Social History   Socioeconomic History   Marital status: Divorced    Spouse name: Not on file   Number of children: 0   Years of education: 12   Highest education level: Not on file  Occupational History   Occupation: Unemployed   Tobacco Use   Smoking status: Never   Smokeless tobacco: Never  Vaping Use   Vaping Use: Never used  Substance and Sexual Activity   Alcohol use: No    Alcohol/week: 0.0 standard drinks of alcohol    Comment: pt reports a history of drinking etoh but denies current use   Drug use: No   Sexual  activity: Never  Other Topics Concern   Not on file  Social History Narrative   Patient lives at home with her mother Kelli Walls.    Patient is single.    Patient has no children.    Patient has 12th grade education.    Social Determinants of Health   Financial Resource Strain: Not on file  Food Insecurity: Not on file  Transportation Needs: Not on file  Physical Activity: Not on file  Stress: Not on file  Social Connections: Not on file       Objective:   Physical Exam Vitals reviewed.  Constitutional:      General: She is not in acute distress.    Appearance: She is well-developed. She is obese.  HENT:     Head: Normocephalic and atraumatic.     Right Ear: Tympanic membrane normal.     Left Ear: Tympanic membrane normal.  Eyes:     Pupils: Pupils are equal, round, and reactive to light.  Neck:     Thyroid: No thyromegaly.  Cardiovascular:     Rate and Rhythm: Normal rate and regular rhythm.     Heart sounds: Normal heart sounds. No murmur heard. Pulmonary:     Effort: Pulmonary effort is normal. No respiratory distress.  Breath sounds: Normal breath sounds. No wheezing.  Abdominal:     General: Bowel sounds are normal. There is no distension.     Palpations: Abdomen is soft.     Tenderness: There is no abdominal tenderness.  Musculoskeletal:        General: Tenderness (right wrist with flexion and extension) present.     Cervical back: Normal range of motion and neck supple.  Skin:    General: Skin is warm and dry.  Neurological:     Mental Status: She is alert and oriented to person, place, and time.     Cranial Nerves: No cranial nerve deficit.     Deep Tendon Reflexes: Reflexes are normal and symmetric.  Psychiatric:        Behavior: Behavior normal.        Thought Content: Thought content normal.        Judgment: Judgment normal.       BP 124/86   Pulse 83   Temp (!) 97.3 F (36.3 C) (Temporal)   Ht '5\' 3"'$  (1.6 m)   Wt 228 lb 6.4 oz (103.6 kg)    LMP 09/07/2014   SpO2 98%   BMI 40.46 kg/m      Assessment & Plan:  Kelli Walls comes in today with chief complaint of Fall [address] on right side hurt arm leg and head )   Diagnosis and orders addressed:  1. Injury of right wrist, initial encounter - DG Wrist Complete Right - diclofenac (VOLTAREN) 75 MG EC tablet; Take 1 tablet (75 mg total) by mouth 2 (two) times daily.  Dispense: 60 tablet; Refill: 0  2. Fall on concrete - DG Wrist Complete Right - diclofenac (VOLTAREN) 75 MG EC tablet; Take 1 tablet (75 mg total) by mouth 2 (two) times daily.  Dispense: 60 tablet; Refill: 0  3. Contusion of right wrist, initial encounter   Wrist splint given  Diclofenac BID with food, no other NSAID's Ice Follow up if symptoms worsen or do not improve    Kelli Dun, FNP

## 2022-04-20 NOTE — Patient Instructions (Signed)
Wrist Pain, Adult There are many things that can cause wrist pain. Some common causes include: An injury to the wrist area, such as a sprain, strain, or fracture. Overuse of the joint. A condition that causes increased pressure on a nerve in the wrist (carpal tunnel syndrome). Wear and tear of the joints that occurs with aging (osteoarthritis). Other types of joint inflammation and stiffness (arthritis). Sometimes, the cause of wrist pain is not known. Often, the pain goes away when you follow instructions from your health care provider for relieving pain at home, such as resting the wrist, icing the wrist, or using a splint or an elastic wrap for a short time. If your wrist pain continues, it is important to tell your health care provider. Follow these instructions at home: If you have a splint or elastic wrap: Wear the splint or wrap as told by your health care provider. Remove it only as told by your health care provider. Ask your health care provider if you may remove it for bathing. Loosen the splint or wrap if your fingers tingle, become numb, or turn cold and blue. Check the skin around the splint or wrap every day. Tell your health care provider about any concerns. Keep the splint or wrap clean. If the splint or wrap is not waterproof: Do not let it get wet. Cover it with a watertight covering when you take a bath or shower. Managing pain, stiffness, and swelling  If directed, put ice on the painful area. To do this: If you have a removable splint or wrap, remove it as told by your health care provider. Put ice in a plastic bag. Place a towel between your skin and the bag or between your splint or wrap and the bag. Leave the ice on for 20 minutes, 2-3 times a day. Move your fingers often to reduce stiffness and swelling. Raise (elevate) the injured area above the level of your heart while you are sitting or lying down. Activity Rest your affected wrist as told by your health care  provider. Return to your normal activities as told by your health care provider. Ask your health care provider what activities are safe for you. Ask your health care provider when it is safe to drive if you have a splint or wrap on your wrist. Do exercises as told by your health care provider. General instructions Pay attention to any changes in your symptoms. Take over-the-counter and prescription medicines only as told by your health care provider. Keep all follow-up visits as told by your health care provider. This is important. Contact a health care provider if: You have a sudden, sharp pain in the wrist, hand, or arm that is different or new. The swelling or bruising on your wrist or hand gets worse. Your skin becomes red, gets a rash, or has open sores. Your pain does not get better or it gets worse. You have a fever or chills. Get help right away if: You lose feeling in your fingers or hand. Your fingers turn white, very red, or cold and blue. You cannot move your fingers. Summary Wrist pain in an adult has many different causes. If your wrist pain continues, it is important to tell your health care provider. You may need to wear a splint or an elastic wrap for a short period of time. Return to your normal activities as told by your health care provider. Ask your health care provider what activities are safe for you. This information is not intended   to replace advice given to you by your health care provider. Make sure you discuss any questions you have with your health care provider. Document Revised: 06/11/2019 Document Reviewed: 06/11/2019 Elsevier Patient Education  [employer].

## 2022-04-30 ENCOUNTER — Other Ambulatory Visit: Payer: Self-pay | Admitting: Family

## 2022-04-30 DIAGNOSIS — F411 Generalized anxiety disorder: Secondary | ICD-10-CM

## 2022-04-30 DIAGNOSIS — F331 Major depressive disorder, recurrent, moderate: Secondary | ICD-10-CM

## 2022-05-09 ENCOUNTER — Encounter (HOSPITAL_BASED_OUTPATIENT_CLINIC_OR_DEPARTMENT_OTHER): Payer: Self-pay

## 2022-05-23 ENCOUNTER — Encounter: Payer: Medicaid Other | Admitting: Pulmonary Disease

## 2022-09-13 ENCOUNTER — Encounter: Payer: Self-pay | Admitting: Family

## 2022-09-13 ENCOUNTER — Ambulatory Visit: Payer: Medicaid Other | Admitting: Family

## 2022-09-24 ENCOUNTER — Ambulatory Visit: Payer: Medicaid Other | Admitting: Podiatry

## 2022-09-28 ENCOUNTER — Ambulatory Visit (INDEPENDENT_AMBULATORY_CARE_PROVIDER_SITE_OTHER): Payer: Medicaid Other

## 2022-09-28 ENCOUNTER — Ambulatory Visit (INDEPENDENT_AMBULATORY_CARE_PROVIDER_SITE_OTHER): Payer: Medicaid Other | Admitting: Podiatry

## 2022-09-28 DIAGNOSIS — M79671 Pain in right foot: Secondary | ICD-10-CM | POA: Diagnosis not present

## 2022-09-28 DIAGNOSIS — M2011 Hallux valgus (acquired), right foot: Secondary | ICD-10-CM | POA: Diagnosis not present

## 2022-09-28 DIAGNOSIS — M7752 Other enthesopathy of left foot: Secondary | ICD-10-CM

## 2022-09-28 DIAGNOSIS — M7751 Other enthesopathy of right foot: Secondary | ICD-10-CM | POA: Diagnosis not present

## 2022-09-28 MED ORDER — METHYLPREDNISOLONE 4 MG PO TBPK: ORAL_TABLET | ORAL | 0 refills | Status: DC

## 2022-09-28 MED ORDER — BETAMETHASONE SOD PHOS & ACET 6 (3-3) MG/ML IJ SUSP
3.0000 mg | Freq: Once | INTRAMUSCULAR | Status: AC
  Administered 2022-09-28: 3 mg via INTRA_ARTICULAR

## 2022-09-28 NOTE — Progress Notes (Signed)
   Chief Complaint  Patient presents with   Foot Pain    Patient came in today for ankle pain and swelling,     Subjective:  52 y.o. female presenting today for evaluation of pain and tenderness to the bilateral ankles as well as the right great toe joint.  Gradual onset.  She has a history of bilateral ankle pain.  Cortisone injections in the past have helped significantly alleviate some of her symptoms and tenderness to the ankles.  She presents for further treatment and evaluation   Past Medical History:  Diagnosis Date   Asthma    Depression    Hyperlipidemia    Hypertension    Mental retardation, mild (I.Q. 50-70)    Migraine    Suicide attempt ([place])     Past Surgical History:  Procedure Laterality Date   ABDOMINAL HYSTERECTOMY     ENDOMETRIAL ABLATION     TONSILLECTOMY      Allergies  Allergen Reactions   Penicillins Itching    Objective / Physical Exam:  General:  The patient is alert and oriented x3 in no acute distress. Dermatology:  Skin is warm, dry and supple bilateral lower extremities. Negative for open lesions or macerations. Vascular:  Palpable pedal pulses bilaterally. No edema or erythema noted. Capillary refill within normal limits. Neurological:  Epicritic and protective threshold grossly intact bilaterally.  Musculoskeletal Exam:  Pain on palpation to the anterior lateral medial aspects of the patient's bilateral ankles. Mild edema noted. Range of motion within normal limits to all pedal and ankle joints bilateral. Muscle strength 5/5 in all groups bilateral.  There is also tenderness with palpation and range of motion to the first MTP of the right foot consistent with bunion deformity.  Radiographic Exam RT foot and ankle 09/28/2022:  Normal osseous mineralization. Joint spaces preserved.  Hallux valgus noted with increased intermetatarsal angle of the right foot on AP view.  Moderate degenerative changes noted throughout the pedal joints of the  foot.  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  Assessment: 1. Capsulitis bilateral ankles 2. Hallux valgus/MTP capsulitis right  Plan of Care:  1. Patient was evaluated. X-Rays reviewed.  2. Injection of 0.5 mL Celestone Soluspan injected in the patient's bilateral ankle joints as well as 1st MTP right.  Patient states that the cortisone injection she received in the past helped significantly for a very long period of time 3. Prescription for a medrol dosepak 4.  In regards to the hallux valgus bunion deformity recommend conservative treatment for now.  Recommend good supportive tennis shoes that are wide fitting and do not constrict or irritate the bunion area.  5.  Return to clinic as needed   Edrick Kins, DPM [facility]  Dr. Edrick Kins, DPM    [address], [place] [address]                Office [phone]  Fax [phone]

## 2022-10-04 ENCOUNTER — Other Ambulatory Visit: Payer: Self-pay | Admitting: Podiatry

## 2022-10-04 DIAGNOSIS — M79671 Pain in right foot: Secondary | ICD-10-CM

## 2022-10-04 DIAGNOSIS — M2011 Hallux valgus (acquired), right foot: Secondary | ICD-10-CM

## 2022-10-04 DIAGNOSIS — M7751 Other enthesopathy of right foot: Secondary | ICD-10-CM

## 2022-10-04 DIAGNOSIS — M7752 Other enthesopathy of left foot: Secondary | ICD-10-CM

## 2022-10-30 ENCOUNTER — Emergency Department (HOSPITAL_COMMUNITY)
Admission: EM | Admit: 2022-10-30 | Discharge: 2022-10-30 | Disposition: A | Discharge status: Home / Self Care | Payer: Medicaid Other | Attending: Emergency Medicine | Admitting: Emergency Medicine

## 2022-10-30 ENCOUNTER — Other Ambulatory Visit: Payer: Self-pay

## 2022-10-30 ENCOUNTER — Encounter (HOSPITAL_COMMUNITY): Payer: Self-pay | Admitting: Emergency Medicine

## 2022-10-30 DIAGNOSIS — W57XXXA Bitten or stung by nonvenomous insect and other nonvenomous arthropods, initial encounter: Secondary | ICD-10-CM | POA: Diagnosis not present

## 2022-10-30 DIAGNOSIS — S30860A Insect bite (nonvenomous) of lower back and pelvis, initial encounter: Secondary | ICD-10-CM | POA: Insufficient documentation

## 2022-10-30 MED ORDER — DOXYCYCLINE HYCLATE 100 MG PO CAPS: 100.0000 mg | ORAL_CAPSULE | Freq: Two times a day (BID) | ORAL | 0 refills | Status: DC

## 2022-10-30 NOTE — ED Provider Notes (Signed)
[facility] Provider Note   CSN: [id number] Arrival date & time: 10/30/22  1208     History  Chief Complaint  Patient presents with   Tick Removal    Kelli Walls is a 52 y.o. female.  HPI     Kelli Walls is a 52 y.o. female who presents to the Emergency Department requesting evaluation for tick bite.  States that last evening she felt a tick on her left lower back area.  States her mother removed the tick by pulling it out with her fingers.  Today she noticed swelling to the area with mild pain and some itching that is localized.  Area feels hard.  She denies any rash, joint pain, fever or chills.  Home Medications Prior to Admission medications   Medication Sig Start Date End Date Taking? Authorizing Provider  albuterol (VENTOLIN HFA) 108 (90 Base) MCG/ACT inhaler Inhale into the lungs. 07/09/21 07/09/22  [provider]  amLODipine (NORVASC) 10 MG tablet TAKE 1 TABLET BY MOUTH DAILY 08/21/21   Evelina Dun A, FNP  atorvastatin (LIPITOR) 40 MG tablet TAKE 1 TABLET BY MOUTH DAILY 10/20/21   Evelina Dun A, FNP  busPIRone (BUSPAR) 5 MG tablet Take 1 tablet (5 mg total) by mouth 3 (three) times daily as needed. (NEEDS TO BE SEEN BEFORE NEXT REFILL) 03/19/22   Evelina Dun A, FNP  carvedilol (COREG) 12.5 MG tablet TAKE 1 TABLET BY MOUTH IN THE MORNING AND ONE TABLET IN THE EVENING WITH MEALS 10/04/21   [provider]  chlorthalidone (HYGROTON) 25 MG tablet TAKE HALF TABLET BY MOUTH ONCE DAILY 10/04/21   [provider]  citalopram (CELEXA) 40 MG tablet Take 1 tablet (40 mg total) by mouth daily. 08/11/21   Sharion Balloon, FNP  diclofenac (VOLTAREN) 75 MG EC tablet Take 1 tablet (75 mg total) by mouth 2 (two) times daily. 04/20/22   Sharion Balloon, FNP  losartan (COZAAR) 100 MG tablet TAKE 1 TABLET BY MOUTH DAILY 08/21/21   Evelina Dun A, FNP  methylPREDNISolone (MEDROL DOSEPAK) 4 MG TBPK tablet 6 day dose pack  - take as directed 09/28/22   Edrick Kins, DPM  VENTOLIN HFA 108 (90 Base) MCG/ACT inhaler Inhale into the lungs. 07/08/21   [provider]      Allergies    Penicillins    Review of Systems   Review of Systems  Constitutional:  Negative for chills and fever.  Respiratory:  Negative for shortness of breath.   Cardiovascular:  Negative for chest pain.  Gastrointestinal:  Negative for abdominal pain, nausea and vomiting.  Musculoskeletal:  Negative for arthralgias and myalgias.  Skin:  Negative for color change and rash.       Tick bite left low back area  Neurological:  Negative for dizziness, syncope, weakness and headaches.    Physical Exam Updated Vital Signs BP (!) 147/69   Pulse 79   Temp 98.7 F (37.1 C) (Oral)   Resp 17   LMP 09/07/2014   SpO2 99%  Physical Exam Vitals and nursing note reviewed.  Constitutional:      General: She is not in acute distress.    Appearance: Normal appearance. She is not ill-appearing or toxic-appearing.  Cardiovascular:     Rate and Rhythm: Normal rate and regular rhythm.     Pulses: Normal pulses.  Pulmonary:     Effort: Pulmonary effort is normal.  Musculoskeletal:  General: Normal range of motion.  Skin:    General: Skin is warm.     Capillary Refill: Capillary refill takes less than 2 seconds.     Comments: Localized area of mild edema with central induration.  No fluctuance or erythema.  No target lesions.  No remaining tick parts seen  Neurological:     General: No focal deficit present.     Mental Status: She is alert.     Sensory: No sensory deficit.     Motor: No weakness.     ED Results / Procedures / Treatments   Labs (all labs ordered are listed, but only abnormal results are displayed) Labs Reviewed - No data to display  EKG None  Radiology No results found.  Procedures Procedures    Medications Ordered in ED Medications - No data to display  ED Course/ Medical Decision Making/  A&P                             Medical Decision Making Patient here for evaluation of tick bite.  Duration of tick attachment unknown.  Was removed by family member last evening patient reports itching swelling and induration to the area today.  She has not applied any over-the-counter medications or therapies.  She denies any fever, rash, or joint pains.  No remaining tick parts seen on exam.  Patient reassured.  I suspect localized irritation from the tick bite.  No systemic symptoms currently.  Patient reassured.  Amount and/or Complexity of Data Reviewed Discussion of management or test interpretation with external provider(s): Will treat with doxycycline, patient agreeable to over-the-counter 1% hydrocortisone as needed to the affected area.  Return precautions were discussed.  Will follow-up outpatient if needed.           Final Clinical Impression(s) / ED Diagnoses Final diagnoses:  Tick bite of lower back, initial encounter    Rx / DC Orders ED Discharge Orders     None         Kem Parkinson, PA-C 10/30/22 1340    Godfrey Pick, MD 10/30/22 1752

## 2022-10-30 NOTE — Discharge Instructions (Signed)
As discussed, you may use over-the-counter 1% hydrocortisone twice daily to the affected area.  Take the antibiotic as directed until finished.  Doxycycline can cause some stomach upset.  Do not take the medication on an empty stomach.  Follow-up with your primary care provider for recheck

## 2022-10-30 NOTE — ED Triage Notes (Signed)
Pulled a large white tick off left lower back area. Swelling/hardness noted about size of lemon. Nad.

## 2022-10-31 ENCOUNTER — Telehealth: Payer: Self-pay

## 2022-10-31 NOTE — Transitions of Care (Post Inpatient/ED Visit) (Signed)
   10/31/2022  Name: Kelli Walls MRN: [id number] DOB: Jul 07, 1971  Today's TOC FU Call Status: Today's TOC FU Call Status:: Successful TOC FU Call Competed TOC FU Call Complete Date: 10/31/22  Transition Care Management Follow-up Telephone Call Date of Discharge: 10/30/22 Discharge [facility]: [facility] Type of Discharge: Emergency Department Reason for ED Visit: Other: (insect bite) How have you been since you were released from the hospital?: Better Any questions or concerns?: No  Items Reviewed: Did you receive and understand the discharge instructions provided?: Yes Medications obtained and verified?: Yes (Medications Reviewed) Any new allergies since your discharge?: No Dietary orders reviewed?: Yes Do you have support at home?: Yes People in Home: parent(s)  Home Care and Equipment/Supplies: [place] Ordered?: NA Any new equipment or medical supplies ordered?: NA  Functional Questionnaire: Do you need assistance with bathing/showering or dressing?: No Do you need assistance with meal preparation?: No Do you need assistance with eating?: No Do you have difficulty maintaining continence: No Do you need assistance with getting out of bed/getting out of a chair/moving?: No Do you have difficulty managing or taking your medications?: No  Follow up appointments reviewed: PCP Follow-up appointment confirmed?: No (declined appt) MD Provider Line Number:[phone] Given: No [facility] Follow-up appointment confirmed?: NA Do you need transportation to your follow-up appointment?: No Do you understand care options if your condition(s) worsen?: Yes-patient verbalized understanding    [place], [place] Nurse Health Advisor Direct Dial [phone]

## 2022-12-12 ENCOUNTER — Ambulatory Visit: Payer: Medicaid Other | Admitting: Nurse Practitioner

## 2022-12-12 ENCOUNTER — Encounter: Payer: Self-pay | Admitting: Family

## 2022-12-21 IMAGING — DX DG WRIST COMPLETE 3+V*R*
3 series · 3 of 3 positions shown · non-contrast
Comparison: Prior radiograph dated October 27, 2021 and December 06, 2020.

CLINICAL DATA: Fall

EXAM:
RIGHT WRIST - COMPLETE 3+ VIEW

[wrist ap]
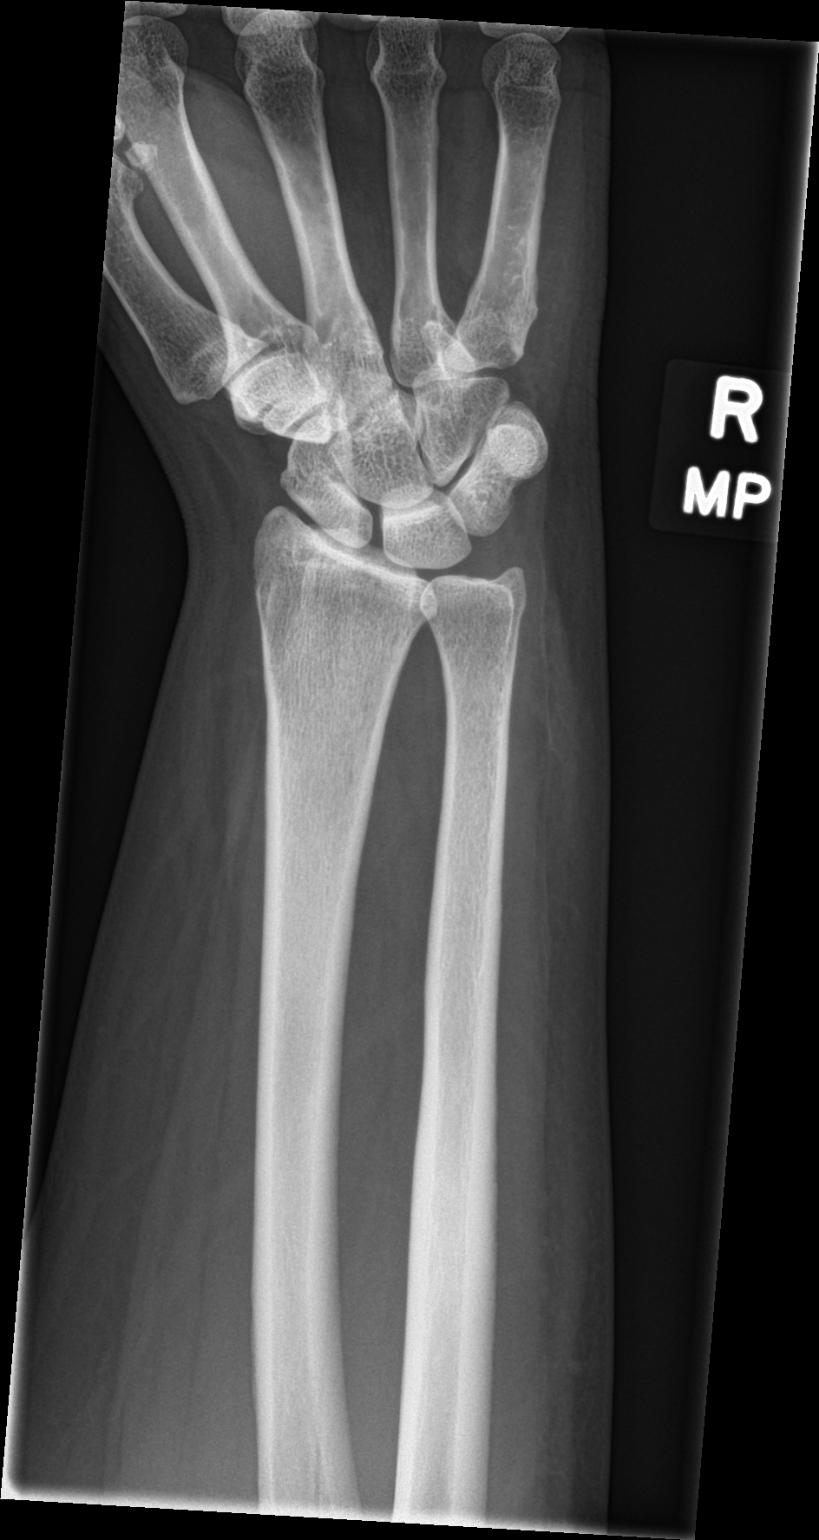

[wrist obl]
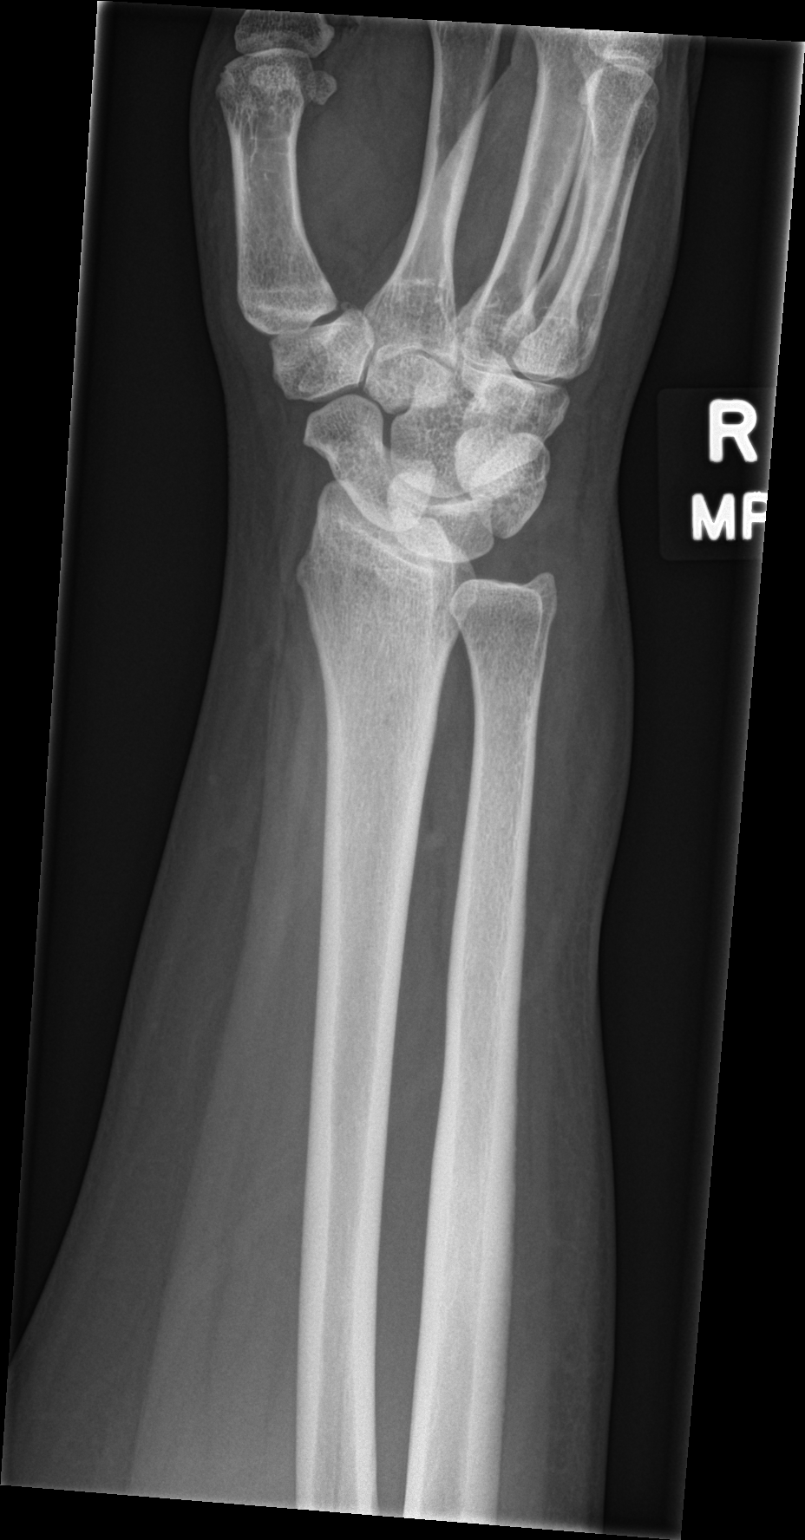

[wrist lat]
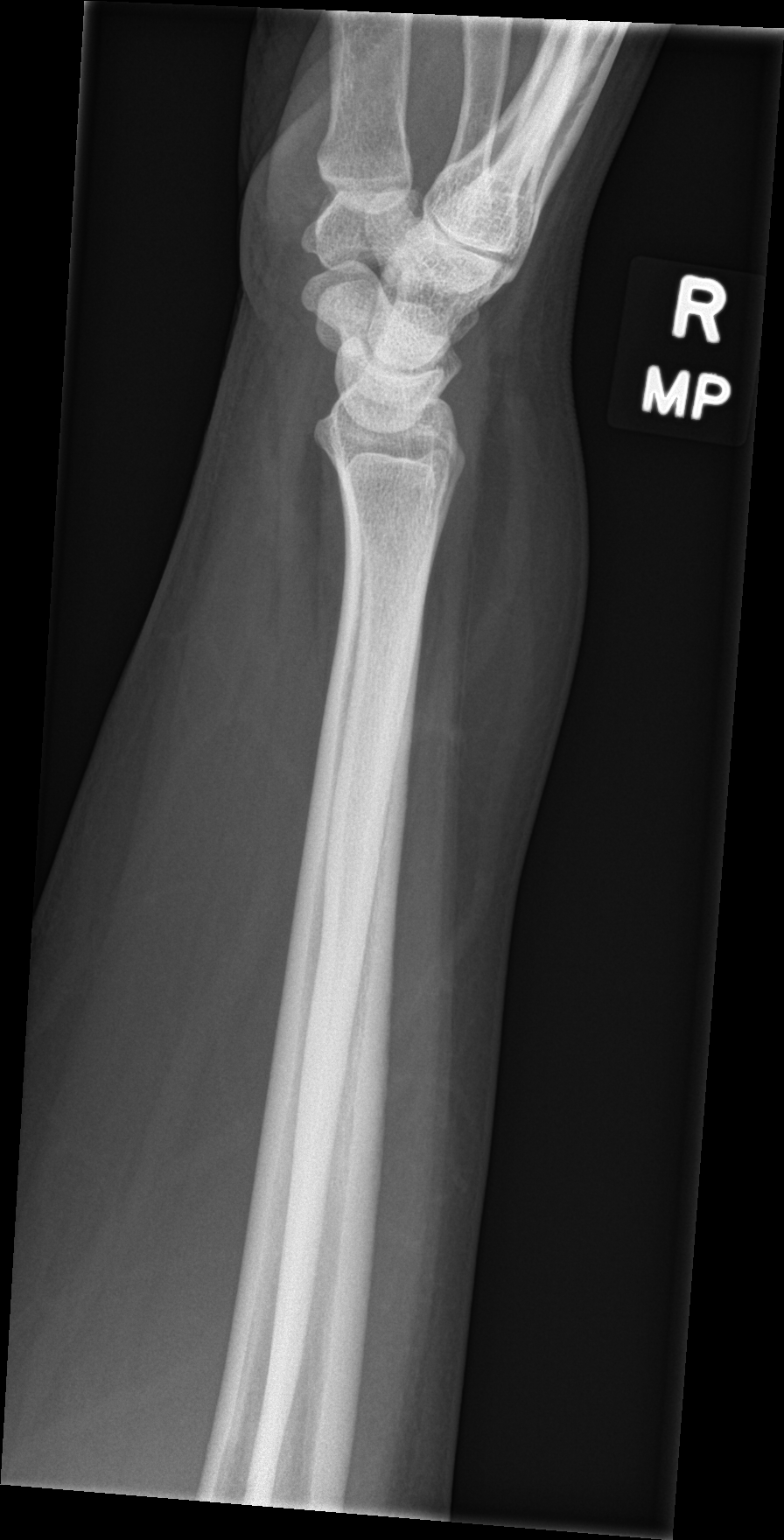

[3 of 3 positions shown; findings below may reference images not displayed]

FINDINGS: There is no evidence of fracture or dislocation. There is no
evidence of arthropathy or other focal bone abnormality. Soft
tissues are unremarkable.
IMPRESSION: Negative.

## 2023-03-04 ENCOUNTER — Other Ambulatory Visit: Payer: MEDICAID

## 2023-04-29 NOTE — Patient Instructions (Addendum)
Hypertension, Adult High blood pressure (hypertension) is when the force of blood pumping through the arteries is too strong. The arteries are the blood vessels that carry blood from the heart throughout the body. Hypertension forces the heart to work harder to pump blood and may cause arteries to become narrow or stiff. Untreated or uncontrolled hypertension can lead to a heart attack, heart failure, a stroke, kidney disease, and other problems. A blood pressure reading consists of a higher number over a lower number. Ideally, your blood pressure should be below 120/80. The first ("top") number is called the systolic pressure. It is a measure of the pressure in your arteries as your heart beats. The second ("bottom") number is called the diastolic pressure. It is a measure of the pressure in your arteries as the heart relaxes. What are the causes? The exact cause of this condition is not known. There are some conditions that result in high blood pressure. What increases the risk? Certain factors may make you more likely to develop high blood pressure. Some of these risk factors are under your control, including: Smoking. Not getting enough exercise or physical activity. Being overweight. Having too much fat, sugar, calories, or salt (sodium) in your diet. Drinking too much alcohol. Other risk factors include: Having a personal history of heart disease, diabetes, high cholesterol, or kidney disease. Stress. Having a family history of high blood pressure and high cholesterol. Having obstructive sleep apnea. Age. The risk increases with age. What are the signs or symptoms? High blood pressure may not cause symptoms. Very high blood pressure (hypertensive crisis) may cause: Headache. Fast or irregular heartbeats (palpitations). Shortness of breath. Nosebleed. Nausea and vomiting. Vision changes. Severe chest pain, dizziness, and seizures. How is this diagnosed? This condition is diagnosed by  measuring your blood pressure while you are seated, with your arm resting on a flat surface, your legs uncrossed, and your feet flat on the floor. The cuff of the blood pressure monitor will be placed directly against the skin of your upper arm at the level of your heart. Blood pressure should be measured at least twice using the same arm. Certain conditions can cause a difference in blood pressure between your right and left arms. If you have a high blood pressure reading during one visit or you have normal blood pressure with other risk factors, you may be asked to: Return on a different day to have your blood pressure checked again. Monitor your blood pressure at home for 1 week or longer. If you are diagnosed with hypertension, you may have other blood or imaging tests to help your health care provider understand your overall risk for other conditions. How is this treated? This condition is treated by making healthy lifestyle changes, such as eating healthy foods, exercising more, and reducing your alcohol intake. You may be referred for counseling on a healthy diet and physical activity. Your health care provider may prescribe medicine if lifestyle changes are not enough to get your blood pressure under control and if: Your systolic blood pressure is above 130. Your diastolic blood pressure is above 80. Your personal target blood pressure may vary depending on your medical conditions, your age, and other factors. Follow these instructions at home: Eating and drinking  Eat a diet that is high in fiber and potassium, and low in sodium, added sugar, and fat. An example of this eating plan is called the DASH diet. DASH stands for Dietary Approaches to Stop Hypertension. To eat this way: Eat  plenty of fresh fruits and vegetables. Try to fill one half of your plate at each meal with fruits and vegetables. Eat whole grains, such as whole-wheat pasta, brown rice, or whole-grain bread. Fill about one  fourth of your plate with whole grains. Eat or drink low-fat dairy products, such as skim milk or low-fat yogurt. Avoid fatty cuts of meat, processed or cured meats, and poultry with skin. Fill about one fourth of your plate with lean proteins, such as fish, chicken without skin, beans, eggs, or tofu. Avoid pre-made and processed foods. These tend to be higher in sodium, added sugar, and fat. Reduce your daily sodium intake. Many people with hypertension should eat less than 1,500 mg of sodium a day. Do not drink alcohol if: Your health care provider tells you not to drink. You are pregnant, may be pregnant, or are planning to become pregnant. If you drink alcohol: Limit how much you have to: 0-1 drink a day for women. 0-2 drinks a day for men. Know how much alcohol is in your drink. In the [place] one drink equals one 12 oz bottle of beer (355 mL), one 5 oz glass of wine (148 mL), or one 1 oz glass of hard liquor (44 mL). Lifestyle  Work with your health care provider to maintain a healthy body weight or to lose weight. Ask what an ideal weight is for you. Get at least 30 minutes of exercise that causes your heart to beat faster (aerobic exercise) most days of the week. Activities may include walking, swimming, or biking. Include exercise to strengthen your muscles (resistance exercise), such as Pilates or lifting weights, as part of your weekly exercise routine. Try to do these types of exercises for 30 minutes at least 3 days a week. Do not use any products that contain nicotine or tobacco. These products include cigarettes, chewing tobacco, and vaping devices, such as e-cigarettes. If you need help quitting, ask your health care provider. Monitor your blood pressure at home as told by your health care provider. Keep all follow-up visits. This is important. Medicines Take over-the-counter and prescription medicines only as told by your health care provider. Follow directions carefully. Blood  pressure medicines must be taken as prescribed. Do not skip doses of blood pressure medicine. Doing this puts you at risk for problems and can make the medicine less effective. Ask your health care provider about side effects or reactions to medicines that you should watch for. Contact a health care provider if you: Think you are having a reaction to a medicine you are taking. Have headaches that keep coming back (recurring). Feel dizzy. Have swelling in your ankles. Have trouble with your vision. Get help right away if you: Develop a severe headache or confusion. Have unusual weakness or numbness. Feel faint. Have severe pain in your chest or abdomen. Vomit repeatedly. Have trouble breathing. These symptoms may be an emergency. Get help right away. Call 911. Do not wait to see if the symptoms will go away. Do not drive yourself to the hospital. Summary Hypertension is when the force of blood pumping through your arteries is too strong. If this condition is not controlled, it may put you at risk for serious complications. Your personal target blood pressure may vary depending on your medical conditions, your age, and other factors. For most people, a normal blood pressure is less than 120/80. Hypertension is treated with lifestyle changes, medicines, or a combination of both. Lifestyle changes include losing weight, eating a healthy,  low-sodium diet, exercising more, and limiting alcohol. This information is not intended to replace advice given to you by your health care provider. Make sure you discuss any questions you have with your health care provider. Document Revised: 05/30/2021 Document Reviewed: 05/30/2021 Elsevier Patient Education  2024 [employer]. Our records indicate that you are due for your annual mammogram/breast imaging. While there is no way to prevent breast cancer, early detection provides the best opportunity for curing it. For women over the age of 69, the American  Cancer Society recommends a yearly clinical breast exam and a yearly mammogram. These practices have saved thousands of lives. We need your help to ensure that you are receiving optimal medical care. Please call the imaging location that has done you previous mammograms. Please remember to list [place] as your primary care. This helps make sure we receive a report and can update your chart.  Below is the contact information for several local breast imaging centers. You may call the location that works best for you, and they will be happy to assistance in making you an appointment. You do not need an order for a regular screening mammogram. However, if you are having any problems or concerns with you breast area, please let your primary care provider know, and appropriate orders will be placed. Please let our office know if you have any questions or concerns. Or if you need information for another imaging center not on this list or outside of the area. We are commented to working with you on your health care journey.   The mobile unit/bus ([facility]) - they come twice a month to our location.  These appointments can be made through our office or by call The [facility]  [facility]  [address] [place], [place] [address] Phone [phone]  [facility] Radiology Department  [address] [place], [place] [address] [phone]  [facility] ([facility])  [phone] [address], [place] [address] [phone]  [facility] - [facility]  [address] [place] [address] [phone]  [facility] - [facility]  [address] [place] [address] [phone]  [facility] Mammography in [place]  [address] [place], [place] [address] [phone]  [facility] [address] [place], [place] [address] [phone]  [facility] [address] [place], [place] [address] [phone]  [facility] [place], [place] [address] [phone]

## 2023-05-06 ENCOUNTER — Ambulatory Visit: Payer: MEDICAID | Admitting: Family

## 2023-05-06 ENCOUNTER — Encounter: Payer: Self-pay | Admitting: Family

## 2023-05-06 VITALS — BP 186/95 | HR 81 | Temp 97.7°F | Ht 63.0 in | Wt 251.0 lb

## 2023-05-06 DIAGNOSIS — I10 Essential (primary) hypertension: Secondary | ICD-10-CM

## 2023-05-06 DIAGNOSIS — Z91199 Patient's noncompliance with other medical treatment and regimen due to unspecified reason: Secondary | ICD-10-CM

## 2023-05-06 DIAGNOSIS — F411 Generalized anxiety disorder: Secondary | ICD-10-CM | POA: Diagnosis not present

## 2023-05-06 DIAGNOSIS — Z23 Encounter for immunization: Secondary | ICD-10-CM | POA: Diagnosis not present

## 2023-05-06 DIAGNOSIS — E782 Mixed hyperlipidemia: Secondary | ICD-10-CM

## 2023-05-06 DIAGNOSIS — Z0001 Encounter for general adult medical examination with abnormal findings: Secondary | ICD-10-CM | POA: Diagnosis not present

## 2023-05-06 DIAGNOSIS — F331 Major depressive disorder, recurrent, moderate: Secondary | ICD-10-CM

## 2023-05-06 DIAGNOSIS — Z0000 Encounter for general adult medical examination without abnormal findings: Secondary | ICD-10-CM

## 2023-05-06 MED ORDER — AMLODIPINE BESYLATE 10 MG PO TABS: 10.0000 mg | ORAL_TABLET | Freq: Every day | ORAL | 3 refills | Status: AC

## 2023-05-06 MED ORDER — CITALOPRAM HYDROBROMIDE 20 MG PO TABS
20.0000 mg | ORAL_TABLET | Freq: Every day | ORAL | 1 refills | Status: DC
Start: 2023-05-06 — End: 2023-06-20

## 2023-05-06 MED ORDER — LOSARTAN POTASSIUM 100 MG PO TABS
100.0000 mg | ORAL_TABLET | Freq: Every day | ORAL | 3 refills | Status: AC
Start: 2023-05-06 — End: ?

## 2023-05-06 MED ORDER — BUSPIRONE HCL 5 MG PO TABS: 5.0000 mg | ORAL_TABLET | Freq: Three times a day (TID) | ORAL | 1 refills | Status: DC | PRN

## 2023-05-06 NOTE — Progress Notes (Signed)
Subjective:    Patient ID: Kelli Walls, female    DOB: 10/12/1970, 52 y.o.   MRN: [id number]  Chief Complaint  Patient presents with   Anxiety    Lost mother    Numbness    In right arm    Medical Management of Chronic Issues    Disucuss wt loss   Pt presents to the office today for CPE and chronic follow up. She is followed by Nephrologists every 3 months.   She is not taking any of her medications other than Coreg. Her BP is very elevated today.   She is followed by [facility], but has not seen them since her mother was sick. She passed away May 08, 2023.  She is followed by GYN annually.  Anxiety Presents for follow-up visit. Symptoms include depressed mood, excessive worry, nervous/anxious behavior and restlessness. Patient reports no shortness of breath. Symptoms occur occasionally. The severity of symptoms is severe.    Hypertension This is a chronic problem. The current episode started more than 1 year ago. The problem has been waxing and waning since onset. The problem is uncontrolled. Associated symptoms include anxiety and malaise/fatigue. Pertinent negatives include no peripheral edema, shortness of breath or sweats. Risk factors for coronary artery disease include dyslipidemia, obesity and sedentary lifestyle. Past treatments include calcium channel blockers, beta blockers and angiotensin blockers. The current treatment provides no improvement.  Depression        This is a chronic problem.  The current episode started more than 1 year ago.   The onset quality is gradual.   The problem occurs intermittently.  Associated symptoms include fatigue, restlessness and sad.  Associated symptoms include no helplessness and no hopelessness.  Past treatments include SSRIs - Selective serotonin reuptake inhibitors.  Past medical history includes anxiety.   Hyperlipidemia This is a chronic problem. The current episode started more than 1 year ago. The problem is uncontrolled.  Exacerbating diseases include obesity. Pertinent negatives include no shortness of breath. Current antihyperlipidemic treatment includes statins. The current treatment provides moderate improvement of lipids. Risk factors for coronary artery disease include dyslipidemia, hypertension, post-menopausal and a sedentary lifestyle.      Review of Systems  Constitutional:  Positive for fatigue and malaise/fatigue.  Respiratory:  Negative for shortness of breath.   Psychiatric/Behavioral:  Positive for depression. The patient is nervous/anxious.   All other systems reviewed and are negative.  Family History  Problem Relation Age of Onset   Diabetes Mother    Hyperlipidemia Mother    Breast cancer Maternal Aunt    Social History   Socioeconomic History   Marital status: Divorced    Spouse name: Not on file   Number of children: 0   Years of education: 12   Highest education level: Not on file  Occupational History   Occupation: Unemployed   Tobacco Use   Smoking status: Never   Smokeless tobacco: Never  Vaping Use   Vaping status: Never Used  Substance and Sexual Activity   Alcohol use: No    Alcohol/week: 0.0 standard drinks of alcohol    Comment: pt reports a history of drinking etoh but denies current use   Drug use: No   Sexual activity: Not Currently  Other Topics Concern   Not on file  Social History Narrative   Patient lives at home with her mother Kelli Walls.    Patient is single.    Patient has no children.    Patient has 12th grade education.  Social Determinants of Health   Financial Resource Strain: Not on file  Food Insecurity: Not on file  Transportation Needs: Not on file  Physical Activity: Not on file  Stress: Not on file  Social Connections: Not on file       Objective:   Physical Exam Vitals reviewed.  Constitutional:      General: She is not in acute distress.    Appearance: She is well-developed. She is obese.  HENT:     Head: Normocephalic  and atraumatic.     Right Ear: Tympanic membrane normal.     Left Ear: Tympanic membrane normal.  Eyes:     Pupils: Pupils are equal, round, and reactive to light.  Neck:     Thyroid: No thyromegaly.  Cardiovascular:     Rate and Rhythm: Normal rate and regular rhythm.     Heart sounds: Normal heart sounds. No murmur heard. Pulmonary:     Effort: Pulmonary effort is normal. No respiratory distress.     Breath sounds: Normal breath sounds. No wheezing.  Abdominal:     General: Bowel sounds are normal. There is no distension.     Palpations: Abdomen is soft.     Tenderness: There is no abdominal tenderness.  Musculoskeletal:        General: No tenderness. Normal range of motion.     Cervical back: Normal range of motion and neck supple.  Skin:    General: Skin is warm and dry.  Neurological:     Mental Status: She is alert and oriented to person, place, and time.     Cranial Nerves: No cranial nerve deficit.     Deep Tendon Reflexes: Reflexes are normal and symmetric.  Psychiatric:        Behavior: Behavior normal.        Thought Content: Thought content normal.        Judgment: Judgment normal.        BP (!) 186/95   Pulse 81   Temp 97.7 F (36.5 C) (Temporal)   Ht 5\' 3"  (1.6 m)   Wt 251 lb (113.9 kg)   LMP 09/07/2014   SpO2 93%   BMI 44.46 kg/m   Assessment & Plan:   Kelli Walls comes in today with chief complaint of Anxiety (Lost mother ), Numbness (In right arm ), and Medical Management of Chronic Issues (Disucuss wt loss)   Diagnosis and orders addressed:  1. Need for shingles vaccine - Zoster, Recombinant (Shingrix) - CMP14+EGFR - CBC with Differential/Platelet  2. Encounter for immunization - Flu vaccine trivalent PF, 6mos and older(Flulaval,Afluria,Fluarix,Fluzone) - CMP14+EGFR - CBC with Differential/Platelet  3. Annual physical exam - CMP14+EGFR - CBC with Differential/Platelet - Lipid panel - TSH  4. Essential hypertension Will restart  Norvasc 10 mg and Losartan 100 -Daily blood pressure log given with instructions on how to fill out and told to bring to next visit -Dash diet information given -Exercise encouraged - Stress Management  -Continue current meds -RTO in 2 weeks  - CMP14+EGFR - CBC with Differential/Platelet - amLODipine (NORVASC) 10 MG tablet; Take 1 tablet (10 mg total) by mouth daily. (Patient not taking: Reported on 05/06/2023)  Dispense: 90 tablet; Refill: 3 - losartan (COZAAR) 100 MG tablet; Take 1 tablet (100 mg total) by mouth daily.  Dispense: 90 tablet; Refill: 3  5. GAD (generalized anxiety disorder) - CMP14+EGFR - CBC with Differential/Platelet - citalopram (CELEXA) 20 MG tablet; Take 1 tablet (20 mg total) by mouth daily.  Dispense: 90 tablet; Refill: 1 - busPIRone (BUSPAR) 5 MG tablet; Take 1 tablet (5 mg total) by mouth 3 (three) times daily as needed. (NEEDS TO BE SEEN BEFORE NEXT REFILL)  Dispense: 90 tablet; Refill: 1  6. Mixed hyperlipidemia - CMP14+EGFR - CBC with Differential/Platelet - Lipid panel  7. Moderate episode of recurrent major depressive disorder (HCC) - CMP14+EGFR - CBC with Differential/Platelet - busPIRone (BUSPAR) 5 MG tablet; Take 1 tablet (5 mg total) by mouth 3 (three) times daily as needed. (NEEDS TO BE SEEN BEFORE NEXT REFILL)  Dispense: 90 tablet; Refill: 1  8. Obesity, morbid (HCC) - CMP14+EGFR - CBC with Differential/Platelet  9. Non-compliance Will restart losartan 100 mg, Norvasc 10 mg.  Will restart celexa at 20 mg instead of 40 mg  Restart Buspar as needed   Labs pending Health Maintenance reviewed Diet and exercise encouraged  Follow up plan: 2 weeks   Jannifer Rodney, FNP

## 2023-05-07 ENCOUNTER — Other Ambulatory Visit: Payer: Self-pay | Admitting: Family

## 2023-05-07 DIAGNOSIS — E782 Mixed hyperlipidemia: Secondary | ICD-10-CM

## 2023-05-07 LAB — CMP14+EGFR
ALT: 19 [IU]/L (ref 0–32)
AST: 25 [IU]/L (ref 0–40)
Albumin: 4.1 g/dL (ref 3.9–4.9)
Alkaline Phosphatase: 61 [IU]/L (ref 44–121)
BUN/Creatinine Ratio: 14 (ref 9–23)
BUN: 16 mg/dL (ref 6–24)
Bilirubin Total: 0.4 mg/dL (ref 0.0–1.2)
CO2: 28 mmol/L (ref 20–29)
Calcium: 9.9 mg/dL (ref 8.7–10.2)
Chloride: 101 mmol/L (ref 96–106)
Creatinine, Ser: 1.11 mg/dL — ABNORMAL HIGH (ref 0.57–1.00)
Globulin, Total: 3.2 g/dL (ref 1.5–4.5)
Glucose: 94 mg/dL (ref 70–99)
Potassium: 3.8 mmol/L (ref 3.5–5.2)
Sodium: 143 mmol/L (ref 134–144)
Total Protein: 7.3 g/dL (ref 6.0–8.5)
eGFR: 61 mL/min/{1.73_m2} (ref >59)

## 2023-05-07 LAB — CBC WITH DIFFERENTIAL/PLATELET
Basophils Absolute: 0 10*3/uL (ref 0.0–0.2)
Basos: 1 %
EOS (ABSOLUTE): 0.1 10*3/uL (ref 0.0–0.4)
Eos: 1 %
Hematocrit: 39.8 % (ref 34.0–46.6)
Hemoglobin: 12.3 g/dL (ref 11.1–15.9)
Immature Grans (Abs): 0 10*3/uL (ref 0.0–0.1)
Immature Granulocytes: 0 %
Lymphocytes Absolute: 2.4 10*3/uL (ref 0.7–3.1)
Lymphs: 33 %
MCH: 26.8 pg (ref 26.6–33.0)
MCHC: 30.9 g/dL — ABNORMAL LOW (ref 31.5–35.7)
MCV: 87 fL (ref 79–97)
Monocytes Absolute: 0.6 10*3/uL (ref 0.1–0.9)
Monocytes: 9 %
Neutrophils Absolute: 4.1 10*3/uL (ref 1.4–7.0)
Neutrophils: 56 %
Platelets: 247 10*3/uL (ref 150–450)
RBC: 4.59 x10E6/uL (ref 3.77–5.28)
RDW: 14.2 % (ref 11.7–15.4)
WBC: 7.3 10*3/uL (ref 3.4–10.8)

## 2023-05-07 LAB — TSH: TSH: 2.23 u[IU]/mL (ref 0.450–4.500)

## 2023-05-07 LAB — LIPID PANEL
Chol/HDL Ratio: 4.4 {ratio} (ref 0.0–4.4)
Cholesterol, Total: 249 mg/dL — ABNORMAL HIGH (ref 100–199)
HDL: 56 mg/dL (ref >39)
LDL Chol Calc (NIH): 164 mg/dL — ABNORMAL HIGH (ref 0–99)
Triglycerides: 160 mg/dL — ABNORMAL HIGH (ref 0–149)
VLDL Cholesterol Cal: 29 mg/dL (ref 5–40)

## 2023-05-07 MED ORDER — ATORVASTATIN CALCIUM 40 MG PO TABS
ORAL_TABLET | ORAL | 2 refills | Status: AC
Start: 2023-05-07 — End: ?

## 2023-05-20 ENCOUNTER — Ambulatory Visit: Payer: MEDICAID | Admitting: Family

## 2023-05-20 ENCOUNTER — Encounter: Payer: Self-pay | Admitting: Family

## 2023-05-20 VITALS — BP 131/88 | HR 89 | Temp 98.6°F | Ht 63.0 in | Wt 252.2 lb

## 2023-05-20 DIAGNOSIS — I10 Essential (primary) hypertension: Secondary | ICD-10-CM | POA: Diagnosis not present

## 2023-05-20 DIAGNOSIS — F331 Major depressive disorder, recurrent, moderate: Secondary | ICD-10-CM

## 2023-05-20 DIAGNOSIS — F411 Generalized anxiety disorder: Secondary | ICD-10-CM

## 2023-05-20 LAB — CMP14+EGFR
ALT: 18 [IU]/L (ref 0–32)
AST: 23 [IU]/L (ref 0–40)
Albumin: 4.1 g/dL (ref 3.9–4.9)
Alkaline Phosphatase: 60 [IU]/L (ref 44–121)
BUN/Creatinine Ratio: 15 (ref 9–23)
BUN: 17 mg/dL (ref 6–24)
Bilirubin Total: 0.3 mg/dL (ref 0.0–1.2)
CO2: 25 mmol/L (ref 20–29)
Calcium: 9.6 mg/dL (ref 8.7–10.2)
Chloride: 104 mmol/L (ref 96–106)
Creatinine, Ser: 1.14 mg/dL — ABNORMAL HIGH (ref 0.57–1.00)
Globulin, Total: 3.1 g/dL (ref 1.5–4.5)
Glucose: 109 mg/dL — ABNORMAL HIGH (ref 70–99)
Potassium: 3.7 mmol/L (ref 3.5–5.2)
Sodium: 142 mmol/L (ref 134–144)
Total Protein: 7.2 g/dL (ref 6.0–8.5)
eGFR: 59 mL/min/{1.73_m2} — ABNORMAL LOW (ref >59)

## 2023-05-20 NOTE — Patient Instructions (Signed)
Hypertension, Adult High blood pressure (hypertension) is when the force of blood pumping through the arteries is too strong. The arteries are the blood vessels that carry blood from the heart throughout the body. Hypertension forces the heart to work harder to pump blood and may cause arteries to become narrow or stiff. Untreated or uncontrolled hypertension can lead to a heart attack, heart failure, a stroke, kidney disease, and other problems. A blood pressure reading consists of a higher number over a lower number. Ideally, your blood pressure should be below 120/80. The first ("top") number is called the systolic pressure. It is a measure of the pressure in your arteries as your heart beats. The second ("bottom") number is called the diastolic pressure. It is a measure of the pressure in your arteries as the heart relaxes. What are the causes? The exact cause of this condition is not known. There are some conditions that result in high blood pressure. What increases the risk? Certain factors may make you more likely to develop high blood pressure. Some of these risk factors are under your control, including: Smoking. Not getting enough exercise or physical activity. Being overweight. Having too much fat, sugar, calories, or salt (sodium) in your diet. Drinking too much alcohol. Other risk factors include: Having a personal history of heart disease, diabetes, high cholesterol, or kidney disease. Stress. Having a family history of high blood pressure and high cholesterol. Having obstructive sleep apnea. Age. The risk increases with age. What are the signs or symptoms? High blood pressure may not cause symptoms. Very high blood pressure (hypertensive crisis) may cause: Headache. Fast or irregular heartbeats (palpitations). Shortness of breath. Nosebleed. Nausea and vomiting. Vision changes. Severe chest pain, dizziness, and seizures. How is this diagnosed? This condition is diagnosed by  measuring your blood pressure while you are seated, with your arm resting on a flat surface, your legs uncrossed, and your feet flat on the floor. The cuff of the blood pressure monitor will be placed directly against the skin of your upper arm at the level of your heart. Blood pressure should be measured at least twice using the same arm. Certain conditions can cause a difference in blood pressure between your right and left arms. If you have a high blood pressure reading during one visit or you have normal blood pressure with other risk factors, you may be asked to: Return on a different day to have your blood pressure checked again. Monitor your blood pressure at home for 1 week or longer. If you are diagnosed with hypertension, you may have other blood or imaging tests to help your health care provider understand your overall risk for other conditions. How is this treated? This condition is treated by making healthy lifestyle changes, such as eating healthy foods, exercising more, and reducing your alcohol intake. You may be referred for counseling on a healthy diet and physical activity. Your health care provider may prescribe medicine if lifestyle changes are not enough to get your blood pressure under control and if: Your systolic blood pressure is above 130. Your diastolic blood pressure is above 80. Your personal target blood pressure may vary depending on your medical conditions, your age, and other factors. Follow these instructions at home: Eating and drinking  Eat a diet that is high in fiber and potassium, and low in sodium, added sugar, and fat. An example of this eating plan is called the DASH diet. DASH stands for Dietary Approaches to Stop Hypertension. To eat this way: Eat   plenty of fresh fruits and vegetables. Try to fill one half of your plate at each meal with fruits and vegetables. Eat whole grains, such as whole-wheat pasta, brown rice, or whole-grain bread. Fill about one  fourth of your plate with whole grains. Eat or drink low-fat dairy products, such as skim milk or low-fat yogurt. Avoid fatty cuts of meat, processed or cured meats, and poultry with skin. Fill about one fourth of your plate with lean proteins, such as fish, chicken without skin, beans, eggs, or tofu. Avoid pre-made and processed foods. These tend to be higher in sodium, added sugar, and fat. Reduce your daily sodium intake. Many people with hypertension should eat less than 1,500 mg of sodium a day. Do not drink alcohol if: Your health care provider tells you not to drink. You are pregnant, may be pregnant, or are planning to become pregnant. If you drink alcohol: Limit how much you have to: 0-1 drink a day for women. 0-2 drinks a day for men. Know how much alcohol is in your drink. In the [place] one drink equals one 12 oz bottle of beer (355 mL), one 5 oz glass of wine (148 mL), or one 1 oz glass of hard liquor (44 mL). Lifestyle  Work with your health care provider to maintain a healthy body weight or to lose weight. Ask what an ideal weight is for you. Get at least 30 minutes of exercise that causes your heart to beat faster (aerobic exercise) most days of the week. Activities may include walking, swimming, or biking. Include exercise to strengthen your muscles (resistance exercise), such as Pilates or lifting weights, as part of your weekly exercise routine. Try to do these types of exercises for 30 minutes at least 3 days a week. Do not use any products that contain nicotine or tobacco. These products include cigarettes, chewing tobacco, and vaping devices, such as e-cigarettes. If you need help quitting, ask your health care provider. Monitor your blood pressure at home as told by your health care provider. Keep all follow-up visits. This is important. Medicines Take over-the-counter and prescription medicines only as told by your health care provider. Follow directions carefully. Blood  pressure medicines must be taken as prescribed. Do not skip doses of blood pressure medicine. Doing this puts you at risk for problems and can make the medicine less effective. Ask your health care provider about side effects or reactions to medicines that you should watch for. Contact a health care provider if you: Think you are having a reaction to a medicine you are taking. Have headaches that keep coming back (recurring). Feel dizzy. Have swelling in your ankles. Have trouble with your vision. Get help right away if you: Develop a severe headache or confusion. Have unusual weakness or numbness. Feel faint. Have severe pain in your chest or abdomen. Vomit repeatedly. Have trouble breathing. These symptoms may be an emergency. Get help right away. Call 911. Do not wait to see if the symptoms will go away. Do not drive yourself to the hospital. Summary Hypertension is when the force of blood pumping through your arteries is too strong. If this condition is not controlled, it may put you at risk for serious complications. Your personal target blood pressure may vary depending on your medical conditions, your age, and other factors. For most people, a normal blood pressure is less than 120/80. Hypertension is treated with lifestyle changes, medicines, or a combination of both. Lifestyle changes include losing weight, eating a healthy,   low-sodium diet, exercising more, and limiting alcohol. This information is not intended to replace advice given to you by your health care provider. Make sure you discuss any questions you have with your health care provider. Document Revised: 05/30/2021 Document Reviewed: 05/30/2021 Elsevier Patient Education  2024 Elsevier Inc.  

## 2023-05-20 NOTE — Progress Notes (Signed)
Subjective:    Patient ID: Kelli Walls, female    DOB: Oct 25, 1970, 52 y.o.   MRN: [id number]  Chief Complaint  Patient presents with   Follow-up   Pt presents to the office today for chronic follow up and recheck on HTN. She was seen on 05/06/23 and she was not taking several of her medications. We restarted her Losartan 100 mg, Norvasc 10 mg.   We also restarted her Celexa 20  mg and Buspar as needed. Reports her anxiety is slightly better.   She is followed by Nephrologists every 3 months.    She is not taking any of her medications other than Coreg. Her BP is very elevated today.    She is followed by [facility], but has not seen them since her mother was sick. She passed away May 06, 2023.   She is followed by GYN annually.  Hypertension This is a chronic problem. The current episode started more than 1 year ago. The problem has been resolved since onset. The problem is controlled. Associated symptoms include anxiety. Pertinent negatives include no malaise/fatigue, peripheral edema or shortness of breath. Risk factors for coronary artery disease include dyslipidemia and obesity. The current treatment provides moderate improvement.  Anxiety Presents for follow-up visit. Symptoms include excessive worry, nervous/anxious behavior, panic and restlessness. Patient reports no shortness of breath. Symptoms occur occasionally. The severity of symptoms is moderate.    Depression        This is a chronic problem.  The current episode started more than 1 year ago.   The problem occurs intermittently.  Associated symptoms include restlessness and sad.  Associated symptoms include no helplessness and no hopelessness.  Past treatments include SSRIs - Selective serotonin reuptake inhibitors.  Past medical history includes anxiety.       Review of Systems  Constitutional:  Negative for malaise/fatigue.  Respiratory:  Negative for shortness of breath.   Psychiatric/Behavioral:  Positive for  depression. The patient is nervous/anxious.   All other systems reviewed and are negative.      Objective:   Physical Exam Vitals reviewed.  Constitutional:      General: She is not in acute distress.    Appearance: She is well-developed. She is obese.  HENT:     Head: Normocephalic and atraumatic.     Right Ear: Tympanic membrane normal.     Left Ear: Tympanic membrane normal.  Eyes:     Pupils: Pupils are equal, round, and reactive to light.  Neck:     Thyroid: No thyromegaly.  Cardiovascular:     Rate and Rhythm: Normal rate and regular rhythm.     Heart sounds: Normal heart sounds. No murmur heard. Pulmonary:     Effort: Pulmonary effort is normal. No respiratory distress.     Breath sounds: Normal breath sounds. No wheezing.  Abdominal:     General: Bowel sounds are normal. There is no distension.     Palpations: Abdomen is soft.     Tenderness: There is no abdominal tenderness.  Musculoskeletal:        General: No tenderness. Normal range of motion.     Cervical back: Normal range of motion and neck supple.  Skin:    General: Skin is warm and dry.  Neurological:     Mental Status: She is alert and oriented to person, place, and time.     Cranial Nerves: No cranial nerve deficit.     Deep Tendon Reflexes: Reflexes are normal and symmetric.  Psychiatric:  Behavior: Behavior normal.        Thought Content: Thought content normal.        Judgment: Judgment normal.        BP 131/88   Pulse 89   Temp 98.6 F (37 C) (Temporal)   Ht 5\' 3"  (1.6 m)   Wt 252 lb 3.2 oz (114.4 kg)   LMP 09/07/2014   SpO2 96%   BMI 44.68 kg/m   Assessment & Plan:   Amarys Venters comes in today with chief complaint of Follow-up   Diagnosis and orders addressed:  1. Essential hypertension At goal today Continue all medications! - CMP14+EGFR  2. GAD (generalized anxiety disorder) Improved, continue current medications  - CMP14+EGFR  3. Moderate episode of recurrent  major depressive disorder (HCC) - CMP14+EGFR   Labs pending Health Maintenance reviewed- Pt will schedule mammogram Diet and exercise encouraged  Follow up plan: 3 months    Jannifer Rodney, FNP

## 2023-05-29 ENCOUNTER — Other Ambulatory Visit: Payer: Self-pay | Admitting: Family

## 2023-05-29 DIAGNOSIS — Z1231 Encounter for screening mammogram for malignant neoplasm of breast: Secondary | ICD-10-CM

## 2023-05-30 ENCOUNTER — Telehealth: Payer: Self-pay | Admitting: Family

## 2023-05-30 NOTE — Telephone Encounter (Signed)
Pt dropped off [employer] to Man[employer] Benefits form. Needs PCP to fill out and mail/fax to company. Pt does not need a copay of the paperwork but does want to be called once paperwork is completed and sent. Will put paperwork in a sleeve and in Cathys box.

## 2023-05-31 ENCOUNTER — Inpatient Hospital Stay: Admission: RE | Admit: 2023-05-31 | Payer: Medicaid Other | Source: Ambulatory Visit

## 2023-05-31 NOTE — Telephone Encounter (Signed)
Form on providers desk for completion.

## 2023-06-03 ENCOUNTER — Other Ambulatory Visit: Payer: Self-pay | Admitting: Family

## 2023-06-05 ENCOUNTER — Telehealth: Payer: Self-pay | Admitting: Family

## 2023-06-06 NOTE — Telephone Encounter (Signed)
Called and spoke with patient. States mother had been managing money, but she has started and doing well. Will completed forms.   Jannifer Rodney, FNP

## 2023-06-06 NOTE — Telephone Encounter (Signed)
Informed ppw was faxed and mailed today

## 2023-06-18 ENCOUNTER — Ambulatory Visit: Payer: Self-pay | Admitting: Family
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
## 2023-06-18 NOTE — Telephone Encounter (Signed)
Copied from CRM [id number]. Topic: Clinical - Red Word Triage >> Jun 18, 2023  3:30 PM Cassiday T wrote: Red Word that prompted transfer to Nurse Triage: patient stated she has had a seizure due to stress she stated she use to have them as a child.
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
## 2023-06-18 NOTE — Telephone Encounter (Signed)
See other encounter that was made

## 2023-06-18 NOTE — Telephone Encounter (Deleted)
Copied from CRM [id number]. Topic: Clinical - Red Word Triage >> Jun 18, 2023  3:30 PM Cassiday T wrote: Red Word that prompted transfer to Nurse Triage: patient stated she has had a seizure due to stress she stated she use to have them as a child.

## 2023-06-19 NOTE — Progress Notes (Unsigned)
Established Patient Office Visit  Subjective   Patient ID: Kelli Walls, female    DOB: 1970/09/27  Age: 52 y.o. MRN: [id number]  No chief complaint on file.   HPI  Patient Active Problem List   Diagnosis Date Noted   Pain of left heel 04/28/2021   GAD (generalized anxiety disorder) 08/18/2020   Seborrheic dermatitis 12/11/2018   Hyperlipemia 04/16/2016   Essential hypertension 12/12/2015   Metabolic syndrome 05/24/2015   Major depression, recurrent (HCC) 05/24/2013   Prolonged Q-T interval on ECG 05/22/2013   Suicide attempt by other psychotropic drug overdose (HCC) 05/21/2013   Acute renal insufficiency 05/21/2013   Hypokalemia 05/21/2013   Migraine headache 04/20/2013   Obesity, morbid (HCC) 02/02/2013   Past Medical History:  Diagnosis Date   Asthma    Depression    Hyperlipidemia    Hypertension    Mental retardation, mild (I.Q. 50-70)    Migraine    Suicide attempt (HCC)    Past Surgical History:  Procedure Laterality Date   ABDOMINAL HYSTERECTOMY     ENDOMETRIAL ABLATION     TONSILLECTOMY     Social History   Tobacco Use   Smoking status: Never   Smokeless tobacco: Never  Vaping Use   Vaping status: Never Used  Substance Use Topics   Alcohol use: No    Alcohol/week: 0.0 standard drinks of alcohol    Comment: pt reports a history of drinking etoh but denies current use   Drug use: No   Social History   Socioeconomic History   Marital status: Divorced    Spouse name: Not on file   Number of children: 0   Years of education: 12   Highest education level: Not on file  Occupational History   Occupation: Unemployed   Tobacco Use   Smoking status: Never   Smokeless tobacco: Never  Vaping Use   Vaping status: Never Used  Substance and Sexual Activity   Alcohol use: No    Alcohol/week: 0.0 standard drinks of alcohol    Comment: pt reports a history of drinking etoh but denies current use   Drug use: No   Sexual activity: Not Currently  Other  Topics Concern   Not on file  Social History Narrative   Patient lives at home with her mother Deatra Canter.    Patient is single.    Patient has no children.    Patient has 12th grade education.    Social Determinants of Health   Financial Resource Strain: Not on file  Food Insecurity: Not on file  Transportation Needs: Not on file  Physical Activity: Not on file  Stress: Not on file  Social Connections: Not on file  Intimate Partner Violence: Not At Risk (10/09/2021)   Received from [facility], [facility]   Humiliation, Afraid, Rape, and Kick questionnaire    Fear of Current or Ex-Partner: No    Emotionally Abused: No    Physically Abused: No    Sexually Abused: No   Family Status  Relation Name Status   Mother  Alive   Father  Other   Mat Aunt  (Not Specified)  No partnership data on file   Family History  Problem Relation Age of Onset   Diabetes Mother    Hyperlipidemia Mother    Breast cancer Maternal Aunt    Allergies  Allergen Reactions   Penicillins Itching      ROS Negative unless indicated in HPI   Objective:  LMP 09/07/2014  BP Readings from Last 3 Encounters:  05/20/23 131/88  05/06/23 (!) 186/95  10/30/22 (!) 147/69   Wt Readings from Last 3 Encounters:  05/20/23 252 lb 3.2 oz (114.4 kg)  05/06/23 251 lb (113.9 kg)  04/20/22 228 lb 6.4 oz (103.6 kg)      Physical Exam   No results found for any visits on 06/20/23.  Last CBC Lab Results  Component Value Date   WBC 7.3 05/06/2023   HGB 12.3 05/06/2023   HCT 39.8 05/06/2023   MCV 87 05/06/2023   MCH 26.8 05/06/2023   RDW 14.2 05/06/2023   PLT 247 05/06/2023   Last metabolic panel Lab Results  Component Value Date   GLUCOSE 109 (H) 05/20/2023   NA 142 05/20/2023   K 3.7 05/20/2023   CL 104 05/20/2023   CO2 25 05/20/2023   BUN 17 05/20/2023   CREATININE 1.14 (H) 05/20/2023   EGFR 59 (L) 05/20/2023   CALCIUM 9.6 05/20/2023   PHOS 2.4 (L) 09/04/2021   PROT 7.2  05/20/2023   ALBUMIN 4.1 05/20/2023   LABGLOB 3.1 05/20/2023   AGRATIO 1.0 09/04/2021   BILITOT 0.3 05/20/2023   ALKPHOS 60 05/20/2023   AST 23 05/20/2023   ALT 18 05/20/2023   ANIONGAP 7 09/04/2021   Last lipids Lab Results  Component Value Date   CHOL 249 (H) 05/06/2023   HDL 56 05/06/2023   LDLCALC 164 (H) 05/06/2023   TRIG 160 (H) 05/06/2023   CHOLHDL 4.4 05/06/2023   Last hemoglobin A1c Lab Results  Component Value Date   HGBA1C 5.9 (H) 09/04/2021   Last thyroid functions Lab Results  Component Value Date   TSH 2.230 05/06/2023        Assessment & Plan:  There are no diagnoses linked to this encounter.  No follow-ups on file.    Arrie Aran Santa Lighter, DNP [facility] [address] [place], [place] [address] [phone]

## 2023-06-20 ENCOUNTER — Ambulatory Visit (INDEPENDENT_AMBULATORY_CARE_PROVIDER_SITE_OTHER): Payer: MEDICAID | Admitting: Nurse Practitioner

## 2023-06-20 ENCOUNTER — Encounter: Payer: Self-pay | Admitting: Nurse Practitioner

## 2023-06-20 VITALS — BP 135/74 | HR 97 | Temp 97.8°F | Ht 63.0 in | Wt 254.4 lb

## 2023-06-20 DIAGNOSIS — R569 Unspecified convulsions: Secondary | ICD-10-CM | POA: Diagnosis not present

## 2023-06-20 DIAGNOSIS — F331 Major depressive disorder, recurrent, moderate: Secondary | ICD-10-CM | POA: Diagnosis not present

## 2023-06-20 DIAGNOSIS — F411 Generalized anxiety disorder: Secondary | ICD-10-CM | POA: Diagnosis not present

## 2023-06-20 MED ORDER — BUSPIRONE HCL 5 MG PO TABS: 5.0000 mg | ORAL_TABLET | Freq: Two times a day (BID) | ORAL | 0 refills | Status: AC

## 2023-06-20 MED ORDER — FLUOXETINE HCL 10 MG PO CAPS: 10.0000 mg | ORAL_CAPSULE | Freq: Every day | ORAL | 1 refills | Status: AC

## 2023-06-27 ENCOUNTER — Telehealth: Payer: Self-pay | Admitting: *Deleted

## 2023-06-27 NOTE — Telephone Encounter (Signed)
Copied from CRM [id number]. Topic: Referral - Question >> Jun 26, 2023  4:13 PM Mosetta Putt H wrote: Reason for CRM: is doctor going to refer to specialist

## 2023-07-02 ENCOUNTER — Encounter: Payer: Self-pay | Admitting: Neurology

## 2023-07-02 NOTE — Telephone Encounter (Signed)
Called patient she has referral appt already made she states

## 2023-07-02 NOTE — Telephone Encounter (Signed)
Copied from CRM [id number]. Topic: Referral - Question >> Jun 26, 2023  4:13 PM Mosetta Putt H wrote: Reason for CRM: is doctor going to refer to specialist

## 2023-08-16 ENCOUNTER — Ambulatory Visit (INDEPENDENT_AMBULATORY_CARE_PROVIDER_SITE_OTHER): Payer: MEDICAID | Admitting: Neurology

## 2023-08-16 ENCOUNTER — Encounter: Payer: Self-pay | Admitting: Neurology

## 2023-08-16 VITALS — BP 171/99 | HR 90 | Ht 63.0 in | Wt 256.2 lb

## 2023-08-16 DIAGNOSIS — R251 Tremor, unspecified: Secondary | ICD-10-CM

## 2023-08-16 DIAGNOSIS — R4020 Unspecified coma: Secondary | ICD-10-CM

## 2023-08-16 DIAGNOSIS — R404 Transient alteration of awareness: Secondary | ICD-10-CM
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
## 2023-08-22 ENCOUNTER — Ambulatory Visit: Payer: MEDICAID | Admitting: Family

## 2023-08-23 ENCOUNTER — Encounter: Payer: Self-pay | Admitting: Family

## 2023-08-26 ENCOUNTER — Ambulatory Visit (INDEPENDENT_AMBULATORY_CARE_PROVIDER_SITE_OTHER): Payer: MEDICAID

## 2023-08-26 DIAGNOSIS — R404 Transient alteration of awareness: Secondary | ICD-10-CM

## 2023-08-26 DIAGNOSIS — R251 Tremor, unspecified: Secondary | ICD-10-CM | POA: Diagnosis not present

## 2023-08-26 DIAGNOSIS — R4020 Unspecified coma: Secondary | ICD-10-CM

## 2023-08-26 NOTE — Progress Notes (Unsigned)
EEG complete - results pending 
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
## 2023-09-12 ENCOUNTER — Telehealth: Payer: Self-pay

## 2023-09-12 DIAGNOSIS — R4020 Unspecified coma: Secondary | ICD-10-CM

## 2023-09-12 DIAGNOSIS — R404 Transient alteration of awareness: Secondary | ICD-10-CM

## 2023-09-12 DIAGNOSIS — R251 Tremor, unspecified: Secondary | ICD-10-CM

## 2023-09-12 NOTE — Telephone Encounter (Signed)
-----   Message from Kelli Walls sent at 09/10/2023  1:51 PM EST ----- Pls let her know the EEG is normal. Proceed with brain MRI. We also discussed that if office EEG is normal, would do a 3-day EEG. If she agrees, pls order 72-hour EEG, thanks

## 2023-10-18 ENCOUNTER — Other Ambulatory Visit: Payer: MEDICAID

## 2023-10-25 ENCOUNTER — Telehealth: Payer: Self-pay | Admitting: *Deleted

## 2023-11-13 ENCOUNTER — Telehealth: Payer: Self-pay | Admitting: *Deleted

## 2024-01-16 ENCOUNTER — Encounter: Payer: Self-pay | Admitting: Nurse Practitioner

## 2024-01-16 ENCOUNTER — Ambulatory Visit (INDEPENDENT_AMBULATORY_CARE_PROVIDER_SITE_OTHER): Payer: MEDICAID | Admitting: Nurse Practitioner

## 2024-01-16 ENCOUNTER — Ambulatory Visit: Payer: Self-pay

## 2024-01-16 VITALS — BP 160/108 | HR 103 | Temp 98.2°F | Ht 63.0 in | Wt 247.0 lb

## 2024-01-16 DIAGNOSIS — J4521 Mild intermittent asthma with (acute) exacerbation: Secondary | ICD-10-CM | POA: Diagnosis not present

## 2024-01-16 MED ORDER — ALBUTEROL SULFATE HFA 108 (90 BASE) MCG/ACT IN AERS: 2.0000 | INHALATION_SPRAY | Freq: Four times a day (QID) | RESPIRATORY_TRACT | 1 refills | Status: AC | PRN

## 2024-01-16 MED ORDER — PREDNISONE 20 MG PO TABS: 40.0000 mg | ORAL_TABLET | Freq: Every day | ORAL | 0 refills | Status: AC

## 2024-01-16 MED ORDER — BENZONATATE 100 MG PO CAPS: 100.0000 mg | ORAL_CAPSULE | Freq: Three times a day (TID) | ORAL | 0 refills | Status: AC | PRN

## 2024-01-16 NOTE — Patient Instructions (Addendum)

## 2024-01-16 NOTE — Telephone Encounter (Signed)
 Copied from CRM [id number]. Topic: Clinical - Red Word Triage >> Jan 16, 2024 10:54 AM Star [place] wrote: Red Word that prompted transfer to Nurse Triage: coughing up dark brown mucus, no appetite, body aches, headache   ----------------------------------------------------------------------- From previous Reason for Contact - Scheduling: Patient/patient representative is calling to schedule an appointment. Refer to attachments for appointment information.   Reason for Disposition  [1] MILD difficulty breathing (e.g., minimal/no SOB at rest, SOB with walking, pulse <100) AND [2] still present when not coughing  Answer Assessment - Initial Assessment Questions 1. ONSET: When did the cough begin?      3-4 days ago 2. SEVERITY: How bad is the cough today?      Moderate  3. SPUTUM: Describe the color of your sputum (none, dry cough; clear, white, yellow, green)     Brown 4. HEMOPTYSIS: Are you coughing up any blood? If so ask: How much? (flecks, streaks, tablespoons, etc.)     No 5. DIFFICULTY BREATHING: Are you having difficulty breathing? If Yes, ask: How bad is it? (e.g., mild, moderate, severe)    - MILD: No SOB at rest, mild SOB with walking, speaks normally in sentences, can lie down, no retractions, pulse < 100.    - MODERATE: SOB at rest, SOB with minimal exertion and prefers to sit, cannot lie down flat, speaks in phrases, mild retractions, audible wheezing, pulse 100-120.    - SEVERE: Very SOB at rest, speaks in single words, struggling to breathe, sitting hunched forward, retractions, pulse > 120      Mild 6. FEVER: Do you have a fever? If Yes, ask: What is your temperature, how was it measured, and when did it start?     No 7. CARDIAC HISTORY: Do you have any history of heart disease? (e.g., heart attack, congestive heart failure)      No 8. LUNG HISTORY: Do you have any history of lung disease?  (e.g., pulmonary embolus, asthma, emphysema)     Asthma  9. PE  RISK FACTORS: Do you have a history of blood clots? (or: recent major surgery, recent prolonged travel, bedridden)     No 10. OTHER SYMPTOMS: Do you have any other symptoms? (e.g., runny nose, wheezing, chest pain)       Headache, body aches  Protocols used: Cough - Acute Productive-A-AH   FYI Only or Action Required?: FYI only for provider  Patient was last seen in primary care on 06/20/2023 by Anton Baton, NP. Called Nurse Triage reporting Cough. Symptoms began several days ago. Interventions attempted: OTC medications: cough syrup, OTC pain medication. Symptoms are: gradually worsening.  Triage Disposition: See HCP Within 4 Hours (Or PCP Triage)  Patient/caregiver understands and will follow disposition?: Yes

## 2024-01-16 NOTE — Telephone Encounter (Signed)
 Apt scheduled.

## 2024-01-16 NOTE — Progress Notes (Signed)
 Subjective:    Patient ID: Kelli Walls, female    DOB: 1971-05-28, 53 y.o.   MRN: [id number]   Chief Complaint: Cough and Nasal Congestion (Going on a few weeks/)   Cough This is a new problem. The current episode started 1 to 4 weeks ago. The problem has been waxing and waning. The problem occurs every few hours. The cough is Productive of sputum. Associated symptoms include ear congestion, ear pain, nasal congestion, rhinorrhea and wheezing. Pertinent negatives include no chills, fever or shortness of breath. Nothing aggravates the symptoms. She has tried OTC cough suppressant for the symptoms. The treatment provided mild relief.    Patient Active Problem List   Diagnosis Date Noted   Pain of left heel 04/28/2021   GAD (generalized anxiety disorder) 08/18/2020   Seborrheic dermatitis 12/11/2018   Hyperlipemia 04/16/2016   Essential hypertension 12/12/2015   Metabolic syndrome 05/24/2015   Major depression, recurrent (HCC) 05/24/2013   Prolonged Q-T interval on ECG 05/22/2013   Suicide attempt by other psychotropic drug overdose (HCC) 05/21/2013   Acute renal insufficiency 05/21/2013   Hypokalemia 05/21/2013   Migraine headache 04/20/2013   Obesity, morbid (HCC) 02/02/2013       Review of Systems  Constitutional:  Positive for fatigue. Negative for chills and fever.  HENT:  Positive for ear pain and rhinorrhea.   Respiratory:  Positive for cough and wheezing. Negative for shortness of breath.        Objective:   Physical Exam Vitals reviewed.  Constitutional:      Appearance: Normal appearance.   Cardiovascular:     Rate and Rhythm: Normal rate and regular rhythm.     Heart sounds: Normal heart sounds.  Pulmonary:     Breath sounds: Wheezing (exp wehhezes throughout) present.     Comments: Deep dry cough  Skin:    General: Skin is warm.   Neurological:     General: No focal deficit present.     Mental Status: She is alert and oriented to person, place, and  time.   Psychiatric:        Mood and Affect: Mood normal.        Behavior: Behavior normal.    BP (!) 160/108   Pulse (!) 103   Temp 98.2 F (36.8 C) (Temporal)   Ht 5' 3 (1.6 m)   Wt 247 lb (112 kg)   LMP 09/07/2014   SpO2 90%   BMI 43.75 kg/m         Assessment & Plan:  Kelli Walls in today with chief complaint of Cough and Nasal Congestion (Going on a few weeks/)   1. Mild intermittent asthmatic bronchitis with acute exacerbation (Primary) 1. Take meds as prescribed 2. Use a cool mist humidifier especially during the winter months and when heat has been humid. 3. Use saline nose sprays frequently 4. Saline irrigations of the nose can be very helpful if done frequently.  * 4X daily for 1 week*  * Use of a nettie pot can be helpful with this. Follow directions with this* 5. Drink plenty of fluids 6. Keep thermostat turn down low 7.For any cough or congestion- tessalon  perles 8. For fever or aces or pains- take tylenol  or ibuprofen appropriate for age and weight.  * for fevers greater than 101 orally you may alternate ibuprofen and tylenol  every  3 hours.    - predniSONE (DELTASONE) 20 MG tablet; Take 2 tablets (40 mg total) by mouth daily with breakfast for  5 days. 2 po daily for 5 days  Dispense: 10 tablet; Refill: 0 - benzonatate  (TESSALON  PERLES) 100 MG capsule; Take 1 capsule (100 mg total) by mouth 3 (three) times daily as needed for cough.  Dispense: 20 capsule; Refill: 0 - albuterol (VENTOLIN HFA) 108 (90 Base) MCG/ACT inhaler; Inhale 2 puffs into the lungs every 6 (six) hours as needed for wheezing or shortness of breath.  Dispense: 1 each; Refill: 1    The above assessment and management plan was discussed with the patient. The patient verbalized understanding of and has agreed to the management plan. Patient is aware to call the clinic if symptoms persist or worsen. Patient is aware when to return to the clinic for a follow-up visit. Patient educated on when  it is appropriate to go to the emergency department.   Mary-Margaret Gaylyn Keas, FNP
